# Patient Record
Sex: Female | Born: 1965
Health system: Southern US, Community
[De-identification: ages and names within clinical notes are randomized; demographics above are authoritative.]

## PROBLEM LIST (undated history)

## (undated) DIAGNOSIS — G4730 Sleep apnea, unspecified: Secondary | ICD-10-CM

## (undated) DIAGNOSIS — E119 Type 2 diabetes mellitus without complications: Secondary | ICD-10-CM

## (undated) DIAGNOSIS — M1990 Unspecified osteoarthritis, unspecified site: Secondary | ICD-10-CM

## (undated) DIAGNOSIS — R51 Headache: Secondary | ICD-10-CM

## (undated) DIAGNOSIS — E611 Iron deficiency: Secondary | ICD-10-CM

## (undated) DIAGNOSIS — F32A Depression, unspecified: Secondary | ICD-10-CM

## (undated) DIAGNOSIS — B019 Varicella without complication: Secondary | ICD-10-CM

## (undated) DIAGNOSIS — I10 Essential (primary) hypertension: Secondary | ICD-10-CM

## (undated) DIAGNOSIS — F329 Major depressive disorder, single episode, unspecified: Secondary | ICD-10-CM

## (undated) DIAGNOSIS — E785 Hyperlipidemia, unspecified: Secondary | ICD-10-CM

## (undated) DIAGNOSIS — E0590 Thyrotoxicosis, unspecified without thyrotoxic crisis or storm: Secondary | ICD-10-CM

## (undated) DIAGNOSIS — N201 Calculus of ureter: Secondary | ICD-10-CM

## (undated) DIAGNOSIS — E042 Nontoxic multinodular goiter: Secondary | ICD-10-CM

## (undated) HISTORY — DX: Varicella without complication: B01.9

## (undated) HISTORY — DX: Nontoxic multinodular goiter: E04.2

## (undated) HISTORY — DX: Depression, unspecified: F32.A

## (undated) HISTORY — DX: Major depressive disorder, single episode, unspecified: F32.9

---

## 1997-07-18 ENCOUNTER — Encounter: Admission: RE | Admit: 1997-07-18 | Discharge: 1997-07-18 | Payer: Self-pay | Admitting: Family Medicine

## 1997-08-15 ENCOUNTER — Encounter: Admission: RE | Admit: 1997-08-15 | Discharge: 1997-08-15 | Payer: Self-pay | Admitting: Family Medicine

## 1998-02-15 ENCOUNTER — Encounter: Admission: RE | Admit: 1998-02-15 | Discharge: 1998-02-15 | Payer: Self-pay | Admitting: Family Medicine

## 1998-05-06 ENCOUNTER — Encounter: Admission: RE | Admit: 1998-05-06 | Discharge: 1998-05-06 | Payer: Self-pay | Admitting: Sports Medicine

## 1998-05-07 ENCOUNTER — Encounter: Admission: RE | Admit: 1998-05-07 | Discharge: 1998-05-07 | Payer: Self-pay | Admitting: Sports Medicine

## 1998-05-17 ENCOUNTER — Encounter: Admission: RE | Admit: 1998-05-17 | Discharge: 1998-05-17 | Payer: Self-pay | Admitting: Family Medicine

## 1998-05-27 ENCOUNTER — Encounter: Admission: RE | Admit: 1998-05-27 | Discharge: 1998-05-27 | Payer: Self-pay | Admitting: Family Medicine

## 1998-06-21 ENCOUNTER — Encounter: Admission: RE | Admit: 1998-06-21 | Discharge: 1998-06-21 | Payer: Self-pay | Admitting: Family Medicine

## 1998-08-02 ENCOUNTER — Emergency Department (HOSPITAL_COMMUNITY): Admission: EM | Admit: 1998-08-02 | Discharge: 1998-08-02 | Payer: Self-pay | Admitting: Emergency Medicine

## 1998-08-06 ENCOUNTER — Encounter: Admission: RE | Admit: 1998-08-06 | Discharge: 1998-08-06 | Payer: Self-pay | Admitting: Family Medicine

## 1999-03-11 ENCOUNTER — Emergency Department (HOSPITAL_COMMUNITY): Admission: EM | Admit: 1999-03-11 | Discharge: 1999-03-11 | Payer: Self-pay | Admitting: Emergency Medicine

## 1999-03-11 ENCOUNTER — Encounter: Payer: Self-pay | Admitting: Emergency Medicine

## 1999-04-04 ENCOUNTER — Encounter: Admission: RE | Admit: 1999-04-04 | Discharge: 1999-04-04 | Payer: Self-pay | Admitting: Family Medicine

## 1999-04-25 ENCOUNTER — Encounter: Admission: RE | Admit: 1999-04-25 | Discharge: 1999-04-25 | Payer: Self-pay | Admitting: Family Medicine

## 1999-05-30 ENCOUNTER — Encounter: Admission: RE | Admit: 1999-05-30 | Discharge: 1999-05-30 | Payer: Self-pay | Admitting: Family Medicine

## 1999-06-12 ENCOUNTER — Encounter: Admission: RE | Admit: 1999-06-12 | Discharge: 1999-06-12 | Payer: Self-pay | Admitting: Family Medicine

## 1999-08-08 ENCOUNTER — Encounter: Admission: RE | Admit: 1999-08-08 | Discharge: 1999-08-08 | Payer: Self-pay | Admitting: Family Medicine

## 1999-08-15 ENCOUNTER — Encounter: Admission: RE | Admit: 1999-08-15 | Discharge: 1999-08-15 | Payer: Self-pay | Admitting: Family Medicine

## 1999-09-13 ENCOUNTER — Emergency Department (HOSPITAL_COMMUNITY): Admission: EM | Admit: 1999-09-13 | Discharge: 1999-09-13 | Payer: Self-pay | Admitting: Emergency Medicine

## 1999-11-07 ENCOUNTER — Encounter: Admission: RE | Admit: 1999-11-07 | Discharge: 1999-11-07 | Payer: Self-pay | Admitting: Family Medicine

## 2000-02-13 ENCOUNTER — Encounter: Admission: RE | Admit: 2000-02-13 | Discharge: 2000-02-13 | Payer: Self-pay | Admitting: Family Medicine

## 2000-03-01 ENCOUNTER — Encounter: Admission: RE | Admit: 2000-03-01 | Discharge: 2000-03-01 | Payer: Self-pay | Admitting: Sports Medicine

## 2000-03-09 ENCOUNTER — Encounter: Payer: Self-pay | Admitting: Sports Medicine

## 2000-03-09 ENCOUNTER — Ambulatory Visit (HOSPITAL_COMMUNITY): Admission: RE | Admit: 2000-03-09 | Discharge: 2000-03-09 | Payer: Self-pay | Admitting: Sports Medicine

## 2000-03-16 ENCOUNTER — Encounter: Admission: RE | Admit: 2000-03-16 | Discharge: 2000-03-16 | Payer: Self-pay | Admitting: Family Medicine

## 2000-04-28 ENCOUNTER — Encounter: Admission: RE | Admit: 2000-04-28 | Discharge: 2000-04-28 | Payer: Self-pay | Admitting: Family Medicine

## 2000-05-25 ENCOUNTER — Encounter: Admission: RE | Admit: 2000-05-25 | Discharge: 2000-05-25 | Payer: Self-pay | Admitting: Family Medicine

## 2000-10-03 ENCOUNTER — Inpatient Hospital Stay (HOSPITAL_COMMUNITY): Admission: EM | Admit: 2000-10-03 | Discharge: 2000-10-11 | Payer: Self-pay | Admitting: Emergency Medicine

## 2000-10-03 ENCOUNTER — Encounter (INDEPENDENT_AMBULATORY_CARE_PROVIDER_SITE_OTHER): Payer: Self-pay | Admitting: Specialist

## 2000-10-03 ENCOUNTER — Encounter: Payer: Self-pay | Admitting: Gastroenterology

## 2000-10-06 ENCOUNTER — Encounter: Payer: Self-pay | Admitting: Family Medicine

## 2000-10-25 ENCOUNTER — Encounter: Admission: RE | Admit: 2000-10-25 | Discharge: 2000-10-25 | Payer: Self-pay | Admitting: Family Medicine

## 2001-08-04 ENCOUNTER — Encounter: Admission: RE | Admit: 2001-08-04 | Discharge: 2001-08-04 | Payer: Self-pay | Admitting: Sports Medicine

## 2001-08-16 ENCOUNTER — Encounter: Admission: RE | Admit: 2001-08-16 | Discharge: 2001-08-16 | Payer: Self-pay | Admitting: Family Medicine

## 2001-08-24 ENCOUNTER — Encounter: Admission: RE | Admit: 2001-08-24 | Discharge: 2001-08-24 | Payer: Self-pay | Admitting: Family Medicine

## 2001-10-06 ENCOUNTER — Encounter (INDEPENDENT_AMBULATORY_CARE_PROVIDER_SITE_OTHER): Payer: Self-pay | Admitting: *Deleted

## 2001-10-06 ENCOUNTER — Encounter: Admission: RE | Admit: 2001-10-06 | Discharge: 2001-10-06 | Payer: Self-pay | Admitting: Family Medicine

## 2001-12-05 ENCOUNTER — Encounter: Admission: RE | Admit: 2001-12-05 | Discharge: 2001-12-05 | Payer: Self-pay | Admitting: Family Medicine

## 2002-01-16 ENCOUNTER — Ambulatory Visit (HOSPITAL_BASED_OUTPATIENT_CLINIC_OR_DEPARTMENT_OTHER): Admission: RE | Admit: 2002-01-16 | Discharge: 2002-01-16 | Payer: Self-pay | Admitting: Family Medicine

## 2002-01-25 ENCOUNTER — Encounter: Admission: RE | Admit: 2002-01-25 | Discharge: 2002-01-25 | Payer: Self-pay | Admitting: Family Medicine

## 2002-05-24 ENCOUNTER — Encounter: Admission: RE | Admit: 2002-05-24 | Discharge: 2002-05-24 | Payer: Self-pay | Admitting: Family Medicine

## 2002-06-08 ENCOUNTER — Encounter: Payer: Self-pay | Admitting: Obstetrics & Gynecology

## 2002-06-08 ENCOUNTER — Ambulatory Visit (HOSPITAL_COMMUNITY): Admission: RE | Admit: 2002-06-08 | Discharge: 2002-06-08 | Payer: Self-pay | Admitting: Obstetrics & Gynecology

## 2002-06-22 ENCOUNTER — Encounter: Admission: RE | Admit: 2002-06-22 | Discharge: 2002-06-22 | Payer: Self-pay | Admitting: Family Medicine

## 2002-09-11 ENCOUNTER — Encounter: Admission: RE | Admit: 2002-09-11 | Discharge: 2002-09-11 | Payer: Self-pay | Admitting: Family Medicine

## 2002-09-12 ENCOUNTER — Ambulatory Visit (HOSPITAL_BASED_OUTPATIENT_CLINIC_OR_DEPARTMENT_OTHER): Admission: RE | Admit: 2002-09-12 | Discharge: 2002-09-12 | Payer: Self-pay | Admitting: Internal Medicine

## 2004-03-26 ENCOUNTER — Ambulatory Visit: Payer: Self-pay | Admitting: Sports Medicine

## 2004-03-30 HISTORY — PX: CHOLECYSTECTOMY: SHX55

## 2004-04-18 ENCOUNTER — Ambulatory Visit: Payer: Self-pay | Admitting: Sports Medicine

## 2005-04-07 ENCOUNTER — Ambulatory Visit: Payer: Self-pay | Admitting: Family Medicine

## 2005-04-08 ENCOUNTER — Encounter: Admission: RE | Admit: 2005-04-08 | Discharge: 2005-04-08 | Payer: Self-pay | Admitting: Sports Medicine

## 2005-04-23 ENCOUNTER — Ambulatory Visit: Payer: Self-pay | Admitting: Family Medicine

## 2005-06-03 ENCOUNTER — Ambulatory Visit: Payer: Self-pay | Admitting: Family Medicine

## 2005-06-10 ENCOUNTER — Ambulatory Visit: Payer: Self-pay | Admitting: Family Medicine

## 2005-06-11 ENCOUNTER — Encounter: Admission: RE | Admit: 2005-06-11 | Discharge: 2005-06-11 | Payer: Self-pay | Admitting: Family Medicine

## 2005-06-29 ENCOUNTER — Ambulatory Visit: Payer: Self-pay | Admitting: Family Medicine

## 2005-07-10 ENCOUNTER — Encounter: Admission: RE | Admit: 2005-07-10 | Discharge: 2005-07-10 | Payer: Self-pay | Admitting: Sports Medicine

## 2005-09-15 ENCOUNTER — Encounter (INDEPENDENT_AMBULATORY_CARE_PROVIDER_SITE_OTHER): Payer: Self-pay | Admitting: Specialist

## 2005-09-15 ENCOUNTER — Ambulatory Visit (HOSPITAL_COMMUNITY): Admission: RE | Admit: 2005-09-15 | Discharge: 2005-09-16 | Payer: Self-pay | Admitting: Obstetrics and Gynecology

## 2005-09-15 HISTORY — PX: VAGINAL HYSTERECTOMY: SUR661

## 2005-11-26 ENCOUNTER — Ambulatory Visit (HOSPITAL_COMMUNITY): Admission: RE | Admit: 2005-11-26 | Discharge: 2005-11-26 | Payer: Self-pay | Admitting: Family Medicine

## 2005-11-26 ENCOUNTER — Ambulatory Visit: Payer: Self-pay | Admitting: Family Medicine

## 2006-03-30 ENCOUNTER — Encounter (INDEPENDENT_AMBULATORY_CARE_PROVIDER_SITE_OTHER): Payer: Self-pay | Admitting: *Deleted

## 2006-03-30 ENCOUNTER — Encounter (INDEPENDENT_AMBULATORY_CARE_PROVIDER_SITE_OTHER): Payer: Self-pay | Admitting: Family Medicine

## 2006-03-30 LAB — CONVERTED CEMR LAB: Pap Smear: NORMAL

## 2006-04-02 ENCOUNTER — Encounter (INDEPENDENT_AMBULATORY_CARE_PROVIDER_SITE_OTHER): Payer: Self-pay | Admitting: Family Medicine

## 2006-04-02 ENCOUNTER — Ambulatory Visit: Payer: Self-pay | Admitting: Family Medicine

## 2006-04-02 LAB — CONVERTED CEMR LAB
BUN: 9 mg/dL (ref 6–23)
CO2: 24 meq/L (ref 19–32)
Calcium: 9.6 mg/dL (ref 8.4–10.5)
Chloride: 102 meq/L (ref 96–112)
Creatinine, Ser: 0.83 mg/dL (ref 0.40–1.20)
Direct LDL: 144 mg/dL — ABNORMAL HIGH
Glucose, Bld: 110 mg/dL — ABNORMAL HIGH (ref 70–99)
HCT: 40.2 % (ref 36.0–46.0)
Hemoglobin: 12.6 g/dL (ref 12.0–15.0)
MCHC: 31.3 g/dL (ref 30.0–36.0)
MCV: 72.4 fL — ABNORMAL LOW (ref 78.0–100.0)
Platelets: 444 10*3/uL — ABNORMAL HIGH (ref 150–400)
Potassium: 4.4 meq/L (ref 3.5–5.3)
RBC: 5.55 M/uL — ABNORMAL HIGH (ref 3.87–5.11)
RDW: 14.3 % — ABNORMAL HIGH (ref 11.5–14.0)
Sodium: 138 meq/L (ref 135–145)
TSH: 0.976 microintl units/mL (ref 0.350–5.50)
WBC: 7.8 10*3/uL (ref 4.0–10.5)

## 2006-04-16 ENCOUNTER — Ambulatory Visit: Payer: Self-pay | Admitting: Family Medicine

## 2006-05-04 ENCOUNTER — Ambulatory Visit: Payer: Self-pay | Admitting: Family Medicine

## 2006-05-27 DIAGNOSIS — G43909 Migraine, unspecified, not intractable, without status migrainosus: Secondary | ICD-10-CM | POA: Insufficient documentation

## 2006-05-27 DIAGNOSIS — G4730 Sleep apnea, unspecified: Secondary | ICD-10-CM | POA: Insufficient documentation

## 2006-05-27 DIAGNOSIS — I10 Essential (primary) hypertension: Secondary | ICD-10-CM | POA: Insufficient documentation

## 2006-05-27 DIAGNOSIS — E669 Obesity, unspecified: Secondary | ICD-10-CM | POA: Insufficient documentation

## 2006-05-27 DIAGNOSIS — E7800 Pure hypercholesterolemia, unspecified: Secondary | ICD-10-CM | POA: Insufficient documentation

## 2006-05-28 ENCOUNTER — Encounter (INDEPENDENT_AMBULATORY_CARE_PROVIDER_SITE_OTHER): Payer: Self-pay | Admitting: *Deleted

## 2006-06-14 ENCOUNTER — Ambulatory Visit: Payer: Self-pay | Admitting: Sports Medicine

## 2006-06-14 ENCOUNTER — Telehealth: Payer: Self-pay | Admitting: *Deleted

## 2006-06-14 LAB — CONVERTED CEMR LAB
Bilirubin Urine: NEGATIVE
Glucose, Urine, Semiquant: NEGATIVE
Nitrite: POSITIVE
Protein, U semiquant: 30
Specific Gravity, Urine: 1.02
Urobilinogen, UA: 0.2
Whiff Test: POSITIVE
pH: 6.5

## 2006-06-15 ENCOUNTER — Encounter: Payer: Self-pay | Admitting: *Deleted

## 2006-07-08 ENCOUNTER — Ambulatory Visit: Payer: Self-pay | Admitting: Family Medicine

## 2006-07-09 ENCOUNTER — Telehealth: Payer: Self-pay | Admitting: *Deleted

## 2006-07-23 ENCOUNTER — Ambulatory Visit: Payer: Self-pay | Admitting: Family Medicine

## 2006-07-23 LAB — CONVERTED CEMR LAB
Cholesterol, target level: 200 mg/dL
HDL goal, serum: 40 mg/dL
LDL Goal: 160 mg/dL

## 2006-08-09 ENCOUNTER — Telehealth (INDEPENDENT_AMBULATORY_CARE_PROVIDER_SITE_OTHER): Payer: Self-pay | Admitting: Family Medicine

## 2006-08-30 ENCOUNTER — Telehealth: Payer: Self-pay | Admitting: *Deleted

## 2006-08-31 ENCOUNTER — Ambulatory Visit: Payer: Self-pay | Admitting: Family Medicine

## 2006-08-31 ENCOUNTER — Encounter: Admission: RE | Admit: 2006-08-31 | Discharge: 2006-08-31 | Payer: Self-pay | Admitting: Sports Medicine

## 2006-09-01 ENCOUNTER — Encounter: Admission: RE | Admit: 2006-09-01 | Discharge: 2006-09-01 | Payer: Self-pay | Admitting: Family Medicine

## 2006-09-03 ENCOUNTER — Ambulatory Visit: Payer: Self-pay | Admitting: Sports Medicine

## 2006-09-03 ENCOUNTER — Encounter (INDEPENDENT_AMBULATORY_CARE_PROVIDER_SITE_OTHER): Payer: Self-pay | Admitting: Family Medicine

## 2006-09-03 LAB — CONVERTED CEMR LAB
BUN: 13 mg/dL (ref 6–23)
CO2: 28 meq/L (ref 19–32)
Calcium: 9.3 mg/dL (ref 8.4–10.5)
Chloride: 103 meq/L (ref 96–112)
Cholesterol: 186 mg/dL (ref 0–200)
Creatinine, Ser: 0.86 mg/dL (ref 0.40–1.20)
Glucose, Bld: 123 mg/dL — ABNORMAL HIGH (ref 70–99)
HDL: 56 mg/dL (ref >39)
LDL Cholesterol: 118 mg/dL — ABNORMAL HIGH (ref 0–99)
Potassium: 4.9 meq/L (ref 3.5–5.3)
Sodium: 141 meq/L (ref 135–145)
Total CHOL/HDL Ratio: 3.3
Triglycerides: 60 mg/dL (ref <150)
VLDL: 12 mg/dL (ref 0–40)

## 2006-09-07 ENCOUNTER — Encounter (INDEPENDENT_AMBULATORY_CARE_PROVIDER_SITE_OTHER): Payer: Self-pay | Admitting: Family Medicine

## 2006-09-29 ENCOUNTER — Ambulatory Visit: Payer: Self-pay | Admitting: Sports Medicine

## 2006-10-06 ENCOUNTER — Encounter (INDEPENDENT_AMBULATORY_CARE_PROVIDER_SITE_OTHER): Payer: Self-pay | Admitting: Family Medicine

## 2006-10-13 ENCOUNTER — Ambulatory Visit: Payer: Self-pay | Admitting: Family Medicine

## 2006-11-24 ENCOUNTER — Encounter: Payer: Self-pay | Admitting: *Deleted

## 2006-11-24 ENCOUNTER — Ambulatory Visit: Payer: Self-pay | Admitting: Family Medicine

## 2006-11-24 ENCOUNTER — Encounter (INDEPENDENT_AMBULATORY_CARE_PROVIDER_SITE_OTHER): Payer: Self-pay | Admitting: Family Medicine

## 2006-11-24 LAB — CONVERTED CEMR LAB
Bilirubin Urine: NEGATIVE
Glucose, Urine, Semiquant: NEGATIVE
Ketones, urine, test strip: NEGATIVE
Nitrite: NEGATIVE
Protein, U semiquant: NEGATIVE
Specific Gravity, Urine: 1.025
Urobilinogen, UA: 0.2
pH: 5.5

## 2007-02-10 ENCOUNTER — Encounter (INDEPENDENT_AMBULATORY_CARE_PROVIDER_SITE_OTHER): Payer: Self-pay | Admitting: Family Medicine

## 2007-02-21 ENCOUNTER — Encounter (INDEPENDENT_AMBULATORY_CARE_PROVIDER_SITE_OTHER): Payer: Self-pay | Admitting: Family Medicine

## 2007-02-21 ENCOUNTER — Encounter: Payer: Self-pay | Admitting: *Deleted

## 2007-02-21 ENCOUNTER — Ambulatory Visit: Payer: Self-pay | Admitting: Family Medicine

## 2007-02-21 LAB — CONVERTED CEMR LAB
ALT: 17 units/L (ref 0–35)
AST: 17 units/L (ref 0–37)
Albumin: 4.2 g/dL (ref 3.5–5.2)
Alkaline Phosphatase: 56 units/L (ref 39–117)
BUN: 11 mg/dL (ref 6–23)
Basophils Absolute: 0 10*3/uL (ref 0.0–0.1)
Basophils Relative: 0 % (ref 0–1)
CO2: 25 meq/L (ref 19–32)
Calcium: 10.9 mg/dL — ABNORMAL HIGH (ref 8.4–10.5)
Chloride: 102 meq/L (ref 96–112)
Creatinine, Ser: 0.89 mg/dL (ref 0.40–1.20)
Eosinophils Absolute: 0.1 10*3/uL — ABNORMAL LOW (ref 0.2–0.7)
Eosinophils Relative: 2 % (ref 0–5)
Ferritin: 111 ng/mL (ref 10–291)
Glucose, Bld: 130 mg/dL — ABNORMAL HIGH (ref 70–99)
HCT: 41 % (ref 36.0–46.0)
Hemoglobin: 12.7 g/dL (ref 12.0–15.0)
Iron: 102 ug/dL (ref 42–145)
Lymphocytes Relative: 43 % (ref 12–46)
Lymphs Abs: 2.9 10*3/uL (ref 0.7–4.0)
MCHC: 31 g/dL (ref 30.0–36.0)
MCV: 74.8 fL — ABNORMAL LOW (ref 78.0–100.0)
Monocytes Absolute: 0.6 10*3/uL (ref 0.1–1.0)
Monocytes Relative: 10 % (ref 3–12)
Neutro Abs: 3.1 10*3/uL (ref 1.7–7.7)
Neutrophils Relative %: 46 % (ref 43–77)
Platelets: 343 10*3/uL (ref 150–400)
Potassium: 4.6 meq/L (ref 3.5–5.3)
RBC: 5.48 M/uL — ABNORMAL HIGH (ref 3.87–5.11)
RDW: 14.6 % (ref 11.5–15.5)
Saturation Ratios: 27 % (ref 20–55)
Sodium: 138 meq/L (ref 135–145)
TIBC: 380 ug/dL (ref 250–470)
TSH: 1.073 microintl units/mL (ref 0.350–5.50)
Total Bilirubin: 0.4 mg/dL (ref 0.3–1.2)
Total Protein: 7.9 g/dL (ref 6.0–8.3)
UIBC: 278 ug/dL
WBC: 6.7 10*3/uL (ref 4.0–10.5)
Whiff Test: POSITIVE

## 2007-03-09 ENCOUNTER — Ambulatory Visit: Payer: Self-pay | Admitting: Family Medicine

## 2007-03-09 DIAGNOSIS — F329 Major depressive disorder, single episode, unspecified: Secondary | ICD-10-CM | POA: Insufficient documentation

## 2007-03-29 ENCOUNTER — Encounter: Payer: Self-pay | Admitting: Psychology

## 2007-04-04 ENCOUNTER — Encounter (INDEPENDENT_AMBULATORY_CARE_PROVIDER_SITE_OTHER): Payer: Self-pay | Admitting: Family Medicine

## 2007-07-06 ENCOUNTER — Ambulatory Visit: Payer: Self-pay | Admitting: Family Medicine

## 2007-07-06 ENCOUNTER — Encounter (INDEPENDENT_AMBULATORY_CARE_PROVIDER_SITE_OTHER): Payer: Self-pay | Admitting: Family Medicine

## 2007-07-06 LAB — CONVERTED CEMR LAB
Bilirubin Urine: NEGATIVE
Ferritin: 175 ng/mL (ref 10–291)
Glucose, Urine, Semiquant: NEGATIVE
HCT: 37.9 % (ref 36.0–46.0)
Hemoglobin: 13.1 g/dL (ref 12.0–15.0)
Iron: 93 ug/dL (ref 42–145)
Ketones, urine, test strip: NEGATIVE
MCHC: 34.6 g/dL (ref 30.0–36.0)
MCV: 85.9 fL (ref 78.0–100.0)
Nitrite: NEGATIVE
Platelets: 310 10*3/uL (ref 150–400)
Protein, U semiquant: NEGATIVE
RBC: 4.41 M/uL (ref 3.87–5.11)
RDW: 12.8 % (ref 11.5–15.5)
Saturation Ratios: 26 % (ref 20–55)
Specific Gravity, Urine: 1.015
TIBC: 361 ug/dL (ref 250–470)
UIBC: 268 ug/dL
Urobilinogen, UA: 1
Vitamin B-12: 446 pg/mL (ref 211–911)
WBC: 9.9 10*3/uL (ref 4.0–10.5)
pH: 7

## 2007-07-20 ENCOUNTER — Telehealth (INDEPENDENT_AMBULATORY_CARE_PROVIDER_SITE_OTHER): Payer: Self-pay | Admitting: *Deleted

## 2007-07-29 ENCOUNTER — Ambulatory Visit: Payer: Self-pay | Admitting: Family Medicine

## 2007-09-12 ENCOUNTER — Encounter (INDEPENDENT_AMBULATORY_CARE_PROVIDER_SITE_OTHER): Payer: Self-pay | Admitting: Family Medicine

## 2007-11-14 ENCOUNTER — Encounter (HOSPITAL_COMMUNITY): Admission: RE | Admit: 2007-11-14 | Discharge: 2007-12-22 | Payer: Self-pay | Admitting: General Surgery

## 2008-03-05 ENCOUNTER — Encounter: Admission: RE | Admit: 2008-03-05 | Discharge: 2008-03-05 | Payer: Self-pay | Admitting: Family Medicine

## 2009-05-07 ENCOUNTER — Ambulatory Visit (HOSPITAL_BASED_OUTPATIENT_CLINIC_OR_DEPARTMENT_OTHER): Admission: RE | Admit: 2009-05-07 | Discharge: 2009-05-07 | Payer: Self-pay | Admitting: Orthopedic Surgery

## 2009-10-25 ENCOUNTER — Emergency Department (HOSPITAL_COMMUNITY): Admission: EM | Admit: 2009-10-25 | Discharge: 2009-10-25 | Payer: Self-pay | Admitting: Emergency Medicine

## 2010-03-20 ENCOUNTER — Encounter: Payer: Self-pay | Admitting: Family Medicine

## 2010-04-29 NOTE — Assessment & Plan Note (Signed)
Summary: FU ELEVATED BP PER [facility]   Vital Signs:  Patient Profile:   45 Years Old Female Weight:      176 pounds Pulse rate:   85 / minute BP sitting:   127 / 82  Vitals Entered By: Lillia Pauls CMA (July 23, 2006 2:56 PM)               PCP:  Towana Badger MD  Chief Complaint:  fu elevated bp.  History of Present Illness: FU HTN, HChol.  No complaints with medications.  Pt has not yet been taking Zocor, needs new rx.  Headache HPI:      The patient comes in for chronic management of stable headaches.  Since the last visit, the frequency of headaches have decreased, and the intensity of the headaches have decreased.  The headaches will last anywhere from 30 minutes to several days at a time.  She has approximately 2 headaches per month.  Headaches have been occurring since age 38.        On a scale of 1-10, she describes the headache as a 6.  The location of the headaches are bilateral.  Headache quality is throbbing or pulsating.  The headaches are associated with nausea, photophobia, phonophobia, and osmophobia.        The patient denies first or worst H/A of life, change in frequency from prior H/A's, change in severity from prior H/A's, change in features from prior H/A's, new onset H/A's in middle-age or later, new or progressive H/A lasting days, H/A's with Valsalva (cough/sneeze), mylagia, fever, malaise, weight loss, scalp tenderness, jaw claudication, focal neurologic symptoms, confusion, seizures, and impaired level of consciousness.        Additional history: No recent headaches reported.  Pt doing well for this complaint.    Headache Treatment History:      NSAID's have not been tried.    Hypertension History:      She denies headache, chest pain, palpitations, dyspnea with exertion, orthopnea, PND, peripheral edema, visual symptoms, neurologic problems, syncope, and side effects from treatment.  She notes no problems with any antihypertensive medication side effects.         Positive major cardiovascular risk factors include hyperlipidemia and hypertension.  Negative major cardiovascular risk factors include female age less than 10 years old, no history of diabetes, negative family history for ischemic heart disease, and non-tobacco-user status.        Further assessment for target organ damage reveals no history of ASHD, cardiac end-organ damage (CHF/LVH), stroke/TIA, peripheral vascular disease, renal insufficiency, or hypertensive retinopathy.    Lipid Management History:      Positive NCEP/ATP III risk factors include hypertension.  Negative NCEP/ATP III risk factors include female age less than 31 years old, non-diabetic, no family history for ischemic heart disease, non-tobacco-user status, no ASHD (atherosclerotic heart disease), no prior stroke/TIA, and no peripheral vascular disease.        The patient states that she knows about the "Therapeutic Lifestyle Change" diet.  Adjunctive measures started by the patient include aerobic exercise, fiber, ASA, limit alcohol consumpton, and weight reduction.       Current Allergies: No known allergies   Past Medical History:    Reviewed history from 05/27/2006 and no changes required:       TMNG s/p radioactive I treatment 2001, TSH, free T4, T3  normal  09/2001, TVH w/out BSO 10/2005, uses CPAP for mild OSA  Past Surgical History:    Reviewed history  from 05/27/2006 and no changes required:       BTL 1993 - 08/23/2001, Cholecystectomy - 09/27/2000, sleep study with mild OSA (10 obstructive, 72 hypoapneas) - 02/03/2002   Family History:    Reviewed history from 05/27/2006 and no changes required:       Breast cancer on father`s side, DM, HTN on mothers side  Social History:    Reviewed history from 05/27/2006 and no changes required:       Lives with four children.  Has life partner.  No tobacco, etoh or drug use.  Works for [employer] in [place].    Review of Systems      See HPI     Impression  & Recommendations:  Problem # 1:  HYPERTENSION, BENIGN SYSTEMIC (ICD-401.1) Assessment: Improved Stop Lisinopril. Continue HCTZ and Carvedilol. RTC 3 mos for rpt BMET, BP check.  Her updated medication list for this problem includes:    Zestoretic 10-12.5 Mg Tabs (Lisinopril-hydrochlorothiazide) .Marland Kitchen... Take one daily  Orders: [facility]- Est  Level 4 ([address]109)  Future Orders: Lipid-[facility] ([id number]) ... 10/28/2006 Basic Met-[facility] ([id number]) ... 10/28/2006   Problem # 2:  HYPERCHOLESTEROLEMIA (ICD-272.0) Assessment: Unchanged Continue Zocor. Lifestyle mods discussed. RTC 3 months for rpt Lipid panel. Orders: [facility]- Est  Level 4 (13086)  Future Orders: Lipid-[facility] ([id number]) ... 10/28/2006 Basic Met-[facility] ([id number]) ... 10/28/2006   Problem # 3:  MIGRAINE, UNSPEC., W/O INTRACTABLE MIGRAINE (ICD-346.90) Assessment: Improved Headaches controlled with BP control and BB tx. RTC 3 months or as needed. Orders: [facility]- Est  Level 4 (02725)   Headache Assessment/Plan:    Headache Classification:  Mixed Headache Disorder to include migraine with aura and tension headache  Hypertension Assessment/Plan:      The patient's hypertensive risk group is category B: At least one risk factor (excluding diabetes) with no target organ damage.  Today's blood pressure is 127/82.  Her blood pressure goal is < 140/90.  Lipid Assessment/Plan:      Based on NCEP/ATP III, the patient's risk factor category is "0-1 risk factors".  From this information, the patient's calculated lipid goals are as follows: Total cholesterol goal is 200; LDL cholesterol goal is 160; HDL cholesterol goal is 40; Triglyceride goal is 150.     Patient Instructions: 1)  Please schedule a follow-up appointment in 3 months.  Appended Document: FU ELEVATED BP PER [facility] Recommended 81mg  ASA daily.

## 2010-04-29 NOTE — Progress Notes (Signed)
Summary: wi request  Phone Note Call from Patient Call back at Home Phone [phone]   Reason for Call: Talk to Nurse Summary of Call: pt is requesting wi appt, she sts she has a tingling sensation in her arm, she sts it feels numb Initial call taken by: ERIN LEVAN,  August 30, 2006 11:45 AM  Follow-up for Phone Call        c/o tingling in left arm from ring finger and pinkie to shoulders for the past 2 weeks.  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 appt made with pcp. taking otc with little relief Follow-up by: Golden Circle RN,  August 30, 2006 11:47 AM

## 2010-04-29 NOTE — Assessment & Plan Note (Signed)
Summary: SENSITIVITY CHARGE   

## 2010-04-29 NOTE — Assessment & Plan Note (Signed)
Summary: uti wp   Vital Signs:  Patient Profile:   45 Years Old Female Height:     64 inches Weight:      186.6 pounds BMI:     32.15 Temp:     98.2 degrees F Pulse rate:   60 / minute BP sitting:   111 / 72  (left arm)  Pt. in pain?   no  Vitals Entered By: Towana Badger MD (February 21, 2007 11:42 AM)                  PCP:  Towana Badger MD  Chief Complaint:  vaginal discharge.  History of Present Illness: location: vaginal d/c quality:  fouls smelling, mildly itchy severity: mild duration: 1 week timing: gradual onset context:  none modifying factors or associated conditions:  patient sexually active.  Regular periods.  She notes frequent urination.  location: general quality: fatigue severity:  moderate duration:  months timing:  gradual onset context:  patient started carvedilol for migraine headaches and blood pressure control modifying factors or associated conditions:  none.  patient is sleeping adequately.  She does not presently use her CPAP machine, nor has she in remote history.  She does not know what her settings should be.  She also has iron deficiency anemia.  Past History:  Non-smoker.  History of sleep apnea with previous sleep study and CPAP recommended.  Current Allergies: No known allergies      Review of Systems  General      Complains of fatigue and sleep disorder.      Denies fever, loss of appetite, and malaise.  CV      Denies palpitations.  Resp      Denies cough and shortness of breath.  MS      Denies cramps and muscle weakness.  Derm      Denies rash.  Neuro      Denies visual disturbances and weakness.  Endo      Complains of excessive urination.  Heme      Denies bleeding and enlarge lymph nodes.   Physical Exam  General:     Well-developed,well-nourished,in no acute distress; alert,appropriate and cooperative throughout examination Eyes:     No corneal or conjunctival inflammation noted. EOMI. Perrla.  Funduscopic exam benign, without hemorrhages, exudates or papilledema. Vision grossly normal. Lungs:     Normal respiratory effort, chest expands symmetrically. Lungs are clear to auscultation, no crackles or wheezes. Heart:     Normal rate and regular rhythm. S1 and S2 normal without gallop, murmur, click, rub or other extra sounds. Abdomen:     Bowel sounds positive,abdomen soft and non-tender without masses, organomegaly or hernias noted. Msk:     No deformity or scoliosis noted of thoracic or lumbar spine.   Extremities:     No clubbing, cyanosis, edema, or deformity noted with normal full range of motion of all joints.   Neurologic:     No cranial nerve deficits noted. Station and gait are normal. Plantar reflexes are down-going bilaterally. DTRs are symmetrical throughout. Sensory, motor and coordinative functions appear intact. Psych:     tired-looking.  Oriented X3, normally interactive, good eye contact, not anxious appearing, and withdrawn.      Impression & Recommendations:  Problem # 1:  VAGINAL DISCHARGE (ICD-623.5) Wet prep with clues.  [facility]   Problem # 2:  TIREDNESS (ICD-780.79) Rechecking Hgb, iron studies.  Spot check TSH. I suspect this is secondary to her  BB.  Orders: Comp Met-[facility] [id number]) CBC w/Diff-[facility] [id number]) TSH-[facility] [id number]) Ferritin-[facility] [id number]) Iron -[facility] [id number]) Iron Binding Cap (TIBC)-[facility] ([id number]) Sleep Disorder Referral (Sleep Disorder)   Problem # 3:  DYSURIA (ICD-788.1) She has a UA positive for signs of UTI. Will send culture. Her updated medication list for this problem includes:    Metronidazole 500 Mg Tabs (Metronidazole) ..... One tablet two times a day for bacterial vaginitis, for 7 days    Macrobid 100 Mg Caps (Nitrofurantoin monohyd macro) .Marland Kitchen... Take one (1) by mouth twice a day for 7 days  Orders: Urinalysis-[facility] (00000) Urine Culture-[facility] ([id number])   Complete Medication  List: 1)  Aspirin 81 Mg Chew (Aspirin) .... Take 1 tablet by mouth once a day 2)  Carvedilol 12.5 Mg Tabs (Carvedilol) .... Take 1/2 tablet by mouth twice a day 3)  Hydrochlorothiazide 12.5 Mg Caps (Hydrochlorothiazide) .Marland Kitchen.. 1 capsule by mouth once a day 4)  Niferex-150 150-50-50 Mg Caps (Fe-succ ac-c-thre ac) .... Take 1 capsule by mouth once a day 5)  Zocor 20 Mg Tabs (Simvastatin) .... Take 1 tablet by mouth once a day 6)  Metronidazole 500 Mg Tabs (Metronidazole) .... One tablet two times a day for bacterial vaginitis, for 7 days 7)  Macrobid 100 Mg Caps (Nitrofurantoin monohyd macro) .... Take one (1) by mouth twice a day for 7 days   Patient Instructions: 1)  Please schedule a follow-up appointment in 2 weeks.    Prescriptions: MACROBID 100 MG CAPS (NITROFURANTOIN MONOHYD MACRO) Take one (1) by mouth twice a day for 7 days  #14 x 0   Entered and Authorized by:   Towana Badger MD   Signed by:   Towana Badger MD on 02/21/2007   Method used:   Electronically sent to ...       [id number]       [address]       [place], [place]  [address]       Ph: [phone] or [phone]       Fax: [phone]   RxID:   [id number] ZOCOR 20 MG TABS (SIMVASTATIN) Take 1 tablet by mouth once a day  #34 x 6   Entered and Authorized by:   Towana Badger MD   Signed by:   Towana Badger MD on 02/21/2007   Method used:   Electronically sent to ...       [id number]       [address]       [place], [place]  [address]       Ph: [phone] or [phone]       Fax: [phone]   RxID:   [id number] HYDROCHLOROTHIAZIDE 12.5 MG CAPS (HYDROCHLOROTHIAZIDE) 1 capsule by mouth once a day  #34 x 6   Entered and Authorized by:   Towana Badger MD   Signed by:   Towana Badger MD on 02/21/2007   Method used:   Electronically sent to ...       [id number]       [address]       [place], [place]  [address]       Ph: [phone] or [phone]       Fax: [phone]   RxID:  [id number] ASPIRIN 81 MG CHEW (ASPIRIN) Take 1 tablet by mouth once a day  #90 x 12   Entered and Authorized by:   Towana Badger MD   Signed by:   Towana Badger MD on 02/21/2007   Method used:   Electronically sent to ...       [id number]       [address]       [place], [place]  [address]       Ph: [phone] or [phone]       Fax: [phone]   RxID:   [id number] METRONIDAZOLE 500 MG  TABS (METRONIDAZOLE) one tablet two times a day for bacterial vaginitis, for 7 days  #14 x 0   Entered and Authorized by:   Towana Badger MD   Signed by:   Towana Badger MD on 02/21/2007   Method used:   Electronically sent to ...       [id number]       [address]       [place], [place]  [address]       Ph: [phone] or [phone]       Fax: [phone]   RxID:   [id number]  ] Laboratory Results  Date/Time Received: February 21, 2007 11:58 AM  Date/Time Reported: February 21, 2007 12:05 PM   Wet [place]/KOH Source: Vaginal WBC/hpf: rare Bacteria/hpf: 3+  Cocci Clue cells/hpf: moderate  Positive whiff Yeast/hpf: none Trichomonas/hpf: none Comments: ...................................................................DONNA [facility]  February 21, 2007 12:05 PM     Appended Document: UA RESULTS    Lab Visit   Laboratory Results   Urine Tests  Date/Time Received: February 21, 2007 12:47 PM  Date/Time Reported: February 21, 2007 12:49 PM   Routine Urinalysis   Color: yellow Appearance: Clear Glucose: negative   (Normal Range: Negative) Bilirubin: negative   (Normal Range: Negative) Ketone: negative   (Normal Range: Negative) Spec. Gravity: 1.025   (Normal Range: 1.003-1.035) Blood: trace-lysed   (Normal Range: Negative) pH: 5.5    (Normal Range: 5.0-8.0) Protein: negative   (Normal Range: Negative) Urobilinogen: 0.2   (Normal Range: 0-1) Nitrite: positive   (Normal Range: Negative) Leukocyte Esterace: large   (Normal Range: Negative)  Urine Microscopic WBC/hpf: 20+ RBC/hpf: 1-5 Bacteria: 3+ RODS Epithelial: 1-5    Comments: sent for culture ...................................................................DONNA [facility]  February 21, 2007 12:49 PM     Orders Today:  Appended Document: Orders Update    Clinical Lists Changes  Orders: Added new Test order of [facility]- Est Level  3 (03500) - Signed

## 2010-04-29 NOTE — Assessment & Plan Note (Signed)
Summary: routine check/wp   Vital Signs:  Patient Profile:   45 Years Old Female Height:     64 inches Weight:      185.0 pounds Temp:     98.6 degrees F Pulse rate:   73 / minute BP sitting:   136 / 91  (left arm)  Pt. in pain?   no  Vitals Entered By: Alphia Kava (July 06, 2007 2:41 PM)              Is Patient Diabetic? No     PCP:  Towana Badger MD  Chief Complaint:  f/u and ?uti.  History of Present Illness: HPI/ROS Chronic problem #1:  Depression - Patient DNKA'd Dr. Carola Rhine visit.  She notes persistent mood depression, see HPI. HPI/ROS Chronic problem #2:  Fatigue - Patient notes no improvement in fatigue with reduction of Coreg dose.  The symptoms are neither worsening nor improving.  She notes stable intake of iron supplementation. HPI/ROS Chronic problem #3:  Hypertension - See HPI for routine follow-up. HPI/ROS Chronic problem #4:  Iron deficiency anemia - Unclear as to why she is still anemic, if she is.  Hysterectomy for menorrhagia.  Due for update of CBC and iron studies. HPI/ROS Tobacco Use:  Per USPSTF recommendations, tobacco use is assessed on every visit, regardless of chief complaint.  See Risk Factors and discussion in CPOE where applicable. HPI/ROS Obesity:  Per USPSTF recommendations, obesity is assessed on every visit, regardless of chief complaint.  See BMI noted on VS and CPOE where applicable.  HPI New Complaint (loc, qual, sev, dur, ROS):  Foot dryness and cracking.  Several months of intermittent flaking skin and dryness on both feet.  Skin at heel has cracked and fissured in places.  No trauma.  No erythema or pruritus.  She has been using vaseline topically.  She denies sweating or dampness of feet. Past history/Family History of this problem:  No dermatitis history.  HPI New Complaint (loc, qual, sev, dur, ROS): Dysuria.  Dark, cloudy urine.  No fever, flank pain or vaginitis.  3 days history. Past history/Family History of this problem:  history  intermittent UTI.   Depression History:      The patient presents with symptoms of depression which have been present for greater than two weeks.  She notes that the symptoms started approximately 03/14/2007.  The patient is having a depressed mood most of the day.        The patient denies that she feels like life is not worth living, denies that she wishes that she were dead, and denies that she has thought about ending her life.  Her current symptoms often keep her from doing the things she needs to do.         Depression Treatment History:  Prior Medication Used:   Start Date: Assessment of Effect:   Comments:  Effexor (venlafaxine)     07/06/2007     --       --  Headache HPI:      The patient comes in for chronic management of headaches which have been unstable.  Since the last visit, the frequency of headaches have decreased, but the intensity of the headaches have not changed.  The headaches will last anywhere from 30 minutes to several days at a time.  She has approximately 2 headaches per month.  Headaches have been occurring since age 23.  The patient is left handed.        On  a scale of 1-10, she describes the headache as a 4.  The location of the headaches are bilateral.  Headache quality is throbbing or pulsating.  The headaches are associated with nausea, photophobia, phonophobia, and osmophobia.        The patient denies first or worst H/A of life, change in frequency from prior H/A's, change in severity from prior H/A's, change in features from prior H/A's, mylagia, and fever.         Headache Treatment History:      NSAID's have not been tried.    Hypertension History:      She notes no problems with any antihypertensive medication side effects.        Positive major cardiovascular risk factors include hyperlipidemia and hypertension.  Negative major cardiovascular risk factors include female age less than 29 years old, no history of diabetes, negative family history for  ischemic heart disease, and non-tobacco-user status.        Further assessment for target organ damage reveals no history of ASHD, cardiac end-organ damage (CHF/LVH), stroke/TIA, peripheral vascular disease, renal insufficiency, or hypertensive retinopathy.        Current Allergies: No known allergies       Physical Exam  Awake, alert, no distress.  Pt is responsive and intelligible.  No icterus, jaundice or JVD.  Heart sounds are regular and without murmur, thrill or PMI displacement.  Normal work of breathing with lungs clear to auscultation and percussion.  Abdomen is soft, nontender and nondistended.  Normal bowel sounds.  Extremities are warm and well perfused.  Cranial nerves II-XII intact.  Normal upper extremity strength and sensation to light touch.  Normal gait.  Some tenderness to palpation of posterior neck muscles.  Bilateral dry feet with 1cm round flaking lesions on lateral heel.  No frank heel cracking or fissuring, no evidence of trauma, infection or fat pad ateophy.  Headache Neurologic Exam:    Cranial Nerves Intact:  Yes    Focal Neurologic Deficit:  No    Impression & Recommendations:  Problem # 1:  DEPRESSION, CHRONIC (ICD-311) Assessment: Unchanged Patient willing to try antidepressant.  Her updated medication list for this problem includes:    Venlafaxine Hcl 37.5 Mg Tabs (Venlafaxine hcl) ..... One tablet twice a day for depression and prevention of migraine  Orders: [facility]- Est  Level 4 (99214)   Problem # 2:  MIGRAINE, UNSPEC., W/O INTRACTABLE MIGRAINE (ICD-346.90) Venlaxafine for comorbid depression and migraine prophylaxis.  Her updated medication list for this problem includes:    Aspirin 81 Mg Chew (Aspirin) .Marland Kitchen... Take 1 tablet by mouth once a day    Carvedilol 6.25 Mg Tabs (Carvedilol) ..... One tablet twice a day for blood pressure   Problem # 3:  DERMATOPHYTOSIS OF FOOT (ICD-110.4) Assessment: New KOH pending. Orders: [facility]- Est  Level  4 (99214)   Problem # 4:  ANEMIA, IRON DEFICIENCY (ICD-280.9)  Her updated medication list for this problem includes:    Niferex-150 150-50-50 Mg Caps (Fe-succ ac-c-thre ac) .Marland Kitchen... Take 1 capsule by mouth twice a day  Orders: CBC-[facility] (16109) Iron -[facility] [phone]) Iron Binding Cap (TIBC)-[facility] ([id number]) Ferritin-[facility] [phone]) [facility]- Est  Level 4 (69629)   Problem # 5:  TIREDNESS (ICD-780.79)  Orders: B12-[facility] ([id number]) [facility]- Est  Level 4 (10272)   Problem # 6:  CERVICAL STRAIN, BILATERAL (ICD-847.0) Assessment: New Return in two weeks for reevaluation. No evidence of meningismus or red flags for acute neuropathy.  Her updated medication list for this  problem includes:    Aspirin 81 Mg Chew (Aspirin) .Marland Kitchen... Take 1 tablet by mouth once a day    Flexeril 10 Mg Tab (Cyclobenzaprine hcl) .Marland Kitchen... Take 1 tablet by mouth at bedtime  Orders: [facility]- Est  Level 4 (16109)   Problem # 7:  UTI (ICD-599.0)  Her updated medication list for this problem includes:    Macrobid 100 Mg Caps (Nitrofurantoin monohyd macro) .Marland Kitchen... Take one (1) by mouth twice a day  Orders: [facility]- Est  Level 4 (60454)   Complete Medication List: 1)  Aspirin 81 Mg Chew (Aspirin) .... Take 1 tablet by mouth once a day 2)  Carvedilol 6.25 Mg Tabs (Carvedilol) .... One tablet twice a day for blood pressure 3)  Hydrochlorothiazide 12.5 Mg Caps (Hydrochlorothiazide) .Marland Kitchen.. 1 capsule by mouth once a day 4)  Niferex-150 150-50-50 Mg Caps (Fe-succ ac-c-thre ac) .... Take 1 capsule by mouth twice a day 5)  Zocor 20 Mg Tabs (Simvastatin) .... Take 1 tablet by mouth once a day 6)  Venlafaxine Hcl 37.5 Mg Tabs (Venlafaxine hcl) .... One tablet twice a day for depression and prevention of migraine 7)  Flexeril 10 Mg Tab (Cyclobenzaprine hcl) .... Take 1 tablet by mouth at bedtime 8)  Macrobid 100 Mg Caps (Nitrofurantoin monohyd macro) .... Take one (1) by mouth twice a day  Other Orders: Urinalysis-[facility] (00000) KOH-[facility]  (09811)  Headache Assessment/Plan:    Headache Classification:  Mixed Headache Disorder to include migraine with aura and tension headache  Hypertension Assessment/Plan:      The patient's hypertensive risk group is category B: At least one risk factor (excluding diabetes) with no target organ damage.  Her calculated 10 year risk of coronary heart disease is 3 %.  Today's blood pressure is 136/91.  Her blood pressure goal is < 140/90.   Patient Instructions: 1)  Please schedule a follow-up appointment in 2 weeks.    Prescriptions: FLEXERIL 10 MG TAB (CYCLOBENZAPRINE HCL) Take 1 tablet by mouth at bedtime  #30 x 2   Entered and Authorized by:   Towana Badger MD   Signed by:   Towana Badger MD on 07/06/2007   Method used:   Print then Give to Patient   RxID:   [id number]  ] Laboratory Results   Urine Tests  Date/Time Received: July 06, 2007 2:56 PM  Date/Time Reported: July 06, 2007 3:11 PM   Routine Urinalysis   Color: yellow Appearance: Hazy Glucose: negative   (Normal Range: Negative) Bilirubin: negative   (Normal Range: Negative) Ketone: negative   (Normal Range: Negative) Spec. Gravity: 1.015   (Normal Range: 1.003-1.035) Blood: trace-intact   (Normal Range: Negative) pH: 7.0   (Normal Range: 5.0-8.0) Protein: negative   (Normal Range: Negative) Urobilinogen: 1.0   (Normal Range: 0-1) Nitrite: negative   (Normal Range: Negative) Leukocyte Esterace: moderate   (Normal Range: Negative)  Urine Microscopic WBC/HPF: 10-20 RBC/HPF: occ Bacteria/HPF: 3++ Epithelial/HPF: rare    Comments: ...................................................................[facility]  July 06, 2007 3:11 PM     Appended Document: [facility] report    Lab Visit   Laboratory Results  Date/Time Received: July 06, 2007 3:30 PM  Date/Time Reported: July 06, 2007 5:16 PM   Other Tests  Skin KOH: Negative Comments: ...............test performed by......Marland KitchenBonnie A. [place], [place]  (ASCP)    Orders Today:

## 2010-04-29 NOTE — Assessment & Plan Note (Signed)
Summary: [facility] for initial behavioral medicine    PCP:  Towana Badger MD   History of Present Illness: Will await follow-up from patient.  Current Allergies: No known allergies         Complete Medication List: 1)  Aspirin 81 Mg Chew (Aspirin) .... Take 1 tablet by mouth once a day 2)  Carvedilol 6.25 Mg Tabs (Carvedilol) .... One tablet twice a day for blood pressure 3)  Hydrochlorothiazide 12.5 Mg Caps (Hydrochlorothiazide) .Marland Kitchen.. 1 capsule by mouth once a day 4)  Niferex-150 150-50-50 Mg Caps (Fe-succ ac-c-thre ac) .... Take 1 capsule by mouth twice a day 5)  Zocor 20 Mg Tabs (Simvastatin) .... Take 1 tablet by mouth once a day     ]

## 2010-04-29 NOTE — Assessment & Plan Note (Signed)
Summary: tingling l arm-fingers to shoulder x 2 weeks   Vital Signs:  Patient Profile:   45 Years Old Female Height:     63 inches Weight:      172.0 pounds BMI:     30.58 Temp:     98.8 degrees F Pulse rate:   68 / minute BP sitting:   183 / 107  (left arm)  Pt. in pain?   no  Vitals Entered By: Dedra Skeens CMA, (August 31, 2006 3:45 PM)                PCP:  Towana Badger MD  Chief Complaint:  TINGLING IN LEFT ARM-FINGERS TO SHOULDERS X 2 WEEKS.  History of Present Illness: PT c/o median nerve distribution along right arm.  She denies trauma, but notes shoulder pain and an inability to raise the arm much above 90 degrees.  She notes awaking one morning a few days ago with chest and shoulder pain.  The symptoms are steady.  Headache HPI:      The patient comes in for an acute visit for headaches.  The headaches will last anywhere from 30 minutes to several days at a time.  She has approximately 2 headaches per month.  Headaches have been occurring since age 82.        On a scale of 1-10, she describes the headache as a 6.  The location of the headaches are bilateral.  Headache quality is throbbing or pulsating.  The headaches are associated with nausea, photophobia, phonophobia, and osmophobia.        The patient denies first or worst H/A of life, change in frequency from prior H/A's, change in severity from prior H/A's, change in features from prior H/A's, new onset H/A's in middle-age or later, new or progressive H/A lasting days, H/A's with Valsalva (cough/sneeze), mylagia, fever, malaise, weight loss, scalp tenderness, jaw claudication, focal neurologic symptoms, confusion, seizures, and impaired level of consciousness.         Headache Treatment History:      NSAID's have not been tried.  Effective acute treatment medications she has tried include ASA-plain and ASA-butalbital-caffeine, but acetaminophen-plain, naproxen, and ibuprofen were ineffective.    Hypertension History:     She complains of headache, but denies chest pain, palpitations, dyspnea with exertion, orthopnea, PND, peripheral edema, visual symptoms, neurologic problems, syncope, and side effects from treatment.  Further comments include: Pt ran out of antihypertensive therapies prior to visit - a problem with the pharmacy.        Positive major cardiovascular risk factors include hyperlipidemia and hypertension.  Negative major cardiovascular risk factors include female age less than 55 years old, no history of diabetes, negative family history for ischemic heart disease, and non-tobacco-user status.        Further assessment for target organ damage reveals no history of ASHD, cardiac end-organ damage (CHF/LVH), stroke/TIA, peripheral vascular disease, renal insufficiency, or hypertensive retinopathy.      Current Allergies: No known allergies       Headache Neurologic Exam:    Motor Exam/Strength-Left:       Left Biceps Flexion ( ):  normal       Left Triceps Extension ( ):  normal       Left Hand Grasp ( ):  normal       Left Deltoid ( ):  normal       Left Hip Flexors ( ):  normal  Left Ankle Dorsiflexion (L5,L4):  normal       Left Great Toe Dorsiflexion (L5,L4):  normal       Left Heel Walk (L5,some L4):  normal       Left Single Squat & Rise-Quads (L4):  normal       Left Toe Walk-calf (S1):  normal    Motor Exam/Strength-Right:       Right Biceps Flexion ( ):  normal       Right Triceps Extension ( ):  normal       Right Hand Grasp ( ):  normal       Right Deltoid ( ):  normal       Right Hip Flexors ( ):  normal       Right Ankle Dorsiflexion (L5,L4):  normal       Right Great Toe Dorsiflexion (L5,L4):  normal       Right Heel Walk (L5,some L4):  normal       Right Single Squat & Rise-Quads (L4):  normal       Right Toe Walk-calf (S1):  normal    Sensory Exam/Pinprick-Left:       Left Face:  normal       Left Upper Extremity:  decreased       Left Lower Extremity:   normal       Left Medial Foot (L4):  normal       Left Dorsal Foot (L5):  normal       Left Lateral Foot (S1):  normal    Sensory Exam/Pinprick-Right:       Right Face:  normal       Right Upper Extremity:  normal       Right Lower Extremity:  normal       Right Medial Foot (L4):  normal       Right Dorsal Foot (L5):  normal       Right Lateral Foot (S1):  normal    Sensory-Other:  Pt with slight paresthesia along median nerve distribution on the left.  However, she has rotator cuff TTP and impaired ROM.    Reflexes-Left:       Left Biceps ( ):  normal       Left Triceps ( ):  normal       Left Brachioradialis ( ):  normal       Left Knee Jerk (L4):  normal       Left Ankle Reflex (S1):  normal    Reflexes-Right:       Right Biceps ( ):  normal       Right Triceps ( ):  normal       Right Brachioradialis ( ):  normal       Right Knee Jerk (L4):  normal       Right Ankle Reflex (S1):  normal    Impression & Recommendations:  Problem # 1:  ROTATOR CUFF SYNDROME, RIGHT (ICD-726.10)  Orders: Diagnostic X-Ray/Fluoroscopy (Diagnostic X-Ray/Flu) [facility]- Est  Level 4 (09811)   Problem # 2:  HYPERTENSION, BENIGN SYSTEMIC (ICD-401.1) Refilled medications.   The following medications were removed from the medication list:    Zestoretic 10-12.5 Mg Tabs (Lisinopril-hydrochlorothiazide) .Marland Kitchen... Take one daily  Orders: [facility]- Est  Level 4 (99214)   Problem # 3:  MIGRAINE, UNSPEC., W/O INTRACTABLE MIGRAINE (ICD-346.90) Tylenol #3 for acute pain. Recommend BP control and restarting Coreg.  RTC in three weeks. Orders: [facility]- Est  Level 4 (91478)  Problem # 4:  HYPERCHOLESTEROLEMIA (ICD-272.0) Assessment: Unchanged PT due for her repeat lipid panel this month. Orders: [facility]- Est  Level 4 (16109)   Headache Assessment/Plan:    Headache Classification:  Mixed Headache Disorder to include migraine with aura and tension headache  Hypertension Assessment/Plan:      The patient's  hypertensive risk group is category B: At least one risk factor (excluding diabetes) with no target organ damage.  Today's blood pressure is 183/107.  Her blood pressure goal is < 140/90.   Patient Instructions: 1)  Please return one morning before your next visit to get your labs. 2)  Please schedule a follow-up appointment in 3 weeks. 3)  Check your Blood Pressure regularly. If it is above: you should make an appointment. 4)  Take 650-1000mg  of Tylenol every 4-6 hours as needed for relief of pain or comfort of fever AVOID taking more than 4000mg   in a 24 hour period (can cause liver damage in higher doses).

## 2010-04-29 NOTE — Assessment & Plan Note (Signed)
Summary: issues from previous visit wp  Medications Added GABARONE 100 MG  TABS (GABAPENTIN) 2 tablets twice a day ASPIRIN 81 MG CHEW (ASPIRIN) Take 1 tablet by mouth once a day CARVEDILOL 12.5 MG TABS (CARVEDILOL) Take 1 tablet by mouth twice a day HYDROCHLOROTHIAZIDE 12.5 MG CAPS (HYDROCHLOROTHIAZIDE) 1 capsule by mouth once a day NAPROSYN 500 MG TABS (NAPROXEN) Take 1 tablet by mouth twice a day NIFEREX-150 150-50-50 MG CAPS (FE-SUCC AC-C-THRE AC) Take 1 capsule by mouth once a day ZOCOR 20 MG TABS (SIMVASTATIN) Take 1 tablet by mouth once a day MACROBID 100 MG  CAPS (NITROFURANTOIN MONOHYD MACRO) 1 tablet twice a day for 7 days with food      Allergies Added: NKDA  Vital Signs:  Patient Profile:   45 Years Old Female Height:     64 inches Weight:      183 pounds Pulse rate:   85 / minute BP sitting:   145 / 98  Pt. in pain?   yes  Vitals Entered By: Arlyss Repress CMA, (November 24, 2006 11:37 AM)              Is Patient Diabetic? No   Chief Complaint:  1.)left arm pain ongoing 8/10 and right foot (arch) pain x 1 week 7/10;  2.) vaginal discharge itchy and white x 5 days 3.) increased urination and dysuria x 3 day.  Acute Visit History:      The patient complains of genitourinary symptoms and musculoskeletal symptoms.  The patient notes pain in the left shoulder.  The pain began 2 months ago.  The quality of the pain is described as dull ache.  On a scale of 1-10, the intensity is described as a 4.  There has been no history of trauma.  Comments: Patient previously seen for left shoulder rotator cuff injury with ulnar nerve involvement.  Patient has been taking it easy, on reduced work, using naprosyn, and notes no improvement.        The genitourinary symptoms have been present for 3 days.  She notes a history of dysuria, vaginal discharge, and vaginal odor.  The patient has a prior history of kidney stones.  The patient has no history of pyelonephritis, a renal anomaly, renal  disease, diabetes, ectopic pregnancy, pelvic inflammatory disease, sexually transmitted disease, immunosuppression, orl contraceptive agent use, or, IUD usage.  Antibiotics have not been used within the last 4 weeks.  She has had 3 or more urinary tract infections in the last 12 months.        Current Allergies: No known allergies   Past Medical History:    Reviewed history from 05/27/2006 and no changes required:       TMNG s/p radioactive I treatment 2001, TSH, free T4, T3  normal  09/2001, TVH w/out BSO 10/2005, uses CPAP for mild OSA  Past Surgical History:    Reviewed history from 05/27/2006 and no changes required:       BTL 1993 - 08/23/2001, Cholecystectomy - 09/27/2000, sleep study with mild OSA (10 obstructive, 72 hypoapneas) - 02/03/2002   Family History:    Reviewed history from 05/27/2006 and no changes required:       Breast cancer on father`s side, DM, HTN on mothers side  Social History:    Reviewed history from 05/27/2006 and no changes required:       Lives with four children.  Has life partner.  No tobacco, etoh or drug use.  Works for [employer] in [place].  Review of Systems      See HPI      Impression & Recommendations:  Problem # 1:  DYSURIA (ICD-788.1) UA suggestive of infection, patient symptomatic. Yeast in wet prep.  Orders: Urinalysis-[facility] (00000) Urine Culture-[facility] ([id number]) [facility]- Est  Level 4 (09811)  Her updated medication list for this problem includes:    Macrobid 100 Mg Caps (Nitrofurantoin monohyd macro) .Marland Kitchen... 1 tablet twice a day for 7 days with food   Problem # 2:  ROTATOR CUFF SYNDROME, LEFT (ICD-726.10) Continue naprosyn as previsouly prescribed. Increased gabapentin to 200mg  two times a day rather than 100mg  two times a day.  Orders: Physical Therapy Referral (PT) [facility]- Est  Level 4 (91478)   Problem # 3:  PLANTAR FASCIITIS, RIGHT (ICD-728.71) Reviewed stretches.  Will get handout. Ice feet each pm. Use Dr. Margart Sickles  pads every day.  RTC for some of our pads if these don't work.  Her updated medication list for this problem includes:    Naprosyn 500 Mg Tabs (Naproxen) .Marland Kitchen... Take 1 tablet by mouth twice a day  Orders: [facility]- Est  Level 4 (29562)   Problem # 4:  HYPERTENSION, BENIGN SYSTEMIC (ICD-401.1) Assessment: Deteriorated Discussed decreasing salt intake. Will follow her back in 6 weeks.  Her updated medication list for this problem includes:    Carvedilol 12.5 Mg Tabs (Carvedilol) .Marland Kitchen... Take 1 tablet by mouth twice a day    Hydrochlorothiazide 12.5 Mg Caps (Hydrochlorothiazide) .Marland Kitchen... 1 capsule by mouth once a day  Orders: [facility] Est  Level 4 (13086)   Complete Medication List: 1)  Gabarone 100 Mg Tabs (Gabapentin) .... 2 tablets twice a day 2)  Aspirin 81 Mg Chew (Aspirin) .... Take 1 tablet by mouth once a day 3)  Carvedilol 12.5 Mg Tabs (Carvedilol) .... Take 1 tablet by mouth twice a day 4)  Hydrochlorothiazide 12.5 Mg Caps (Hydrochlorothiazide) .Marland Kitchen.. 1 capsule by mouth once a day 5)  Naprosyn 500 Mg Tabs (Naproxen) .... Take 1 tablet by mouth twice a day 6)  Niferex-150 150-50-50 Mg Caps (Fe-succ ac-c-thre ac) .... Take 1 capsule by mouth once a day 7)  Zocor 20 Mg Tabs (Simvastatin) .... Take 1 tablet by mouth once a day 8)  Macrobid 100 Mg Caps (Nitrofurantoin monohyd macro) .Marland Kitchen.. 1 tablet twice a day for 7 days with food  [id number]   Patient Instructions: 1)  Ice feet 66m each evening. 2)  Stretch for 5 mins in the morning as we discussed. 3)  Use Dr. Margart Sickles inserts all the time. 4)  Lay off the salt. 5)  Please schedule a follow-up appointment in 1 month or so. 6)  Take the antibiotics for the urinary tract infection as prescribed.  Take with food to minimize stomach upset. 7)  It is important that you exercise regularly at least 20 minutes 5 times a week. If you develop chest pain, have severe difficulty breathing, or feel very tired , stop exercising  immediately and seek medical attention. 8)  You need to lose weight. Consider a lower calorie diet and regular exercise.  9)  Take an Aspirin every day.    Prescriptions: GABARONE 100 MG  TABS (GABAPENTIN) 2 tablets twice a day  #136 x 0   Entered and Authorized by:   Towana Badger MD   Signed by:   Towana Badger MD on 11/24/2006   Method used:   Historical   RxID:   [id number] MACROBID 100 MG  CAPS (NITROFURANTOIN  MONOHYD MACRO) 1 tablet twice a day for 7 days with food  #14 x 0   Entered and Authorized by:   Towana Badger MD   Signed by:   Towana Badger MD on 11/24/2006   Method used:   Electronically sent to ...       [facility] [phone]       [address]       [place], [place]  [address]       Ph: [phone] or [phone]       Fax: [phone]   RxID:   [id number]   Laboratory Results   Urine Tests  Date/Time Recieved: November 24, 2006 11:41 AM  Date/Time Reported: November 24, 2006 11:59 AM   Routine Urinalysis   Color: yellow Appearance: Clear Glucose: negative   (Normal Range: Negative) Bilirubin: negative   (Normal Range: Negative) Ketone: negative   (Normal Range: Negative) Spec. Gravity: 1.025   (Normal Range: 1.003-1.035) Blood: small   (Normal Range: Negative) pH: 5.5   (Normal Range: 5.0-8.0) Protein: negative   (Normal Range: Negative) Urobilinogen: 0.2   (Normal Range: 0-1) Nitrite: negative   (Normal Range: Negative) Leukocyte Esterace: small   (Normal Range: Negative)  Urine Microscopic WBC/hpf: 10-20 RBC/hpf: 0-3 Bacteria: 3+ rods Epithelial: 0-3    Comments: ...................................................................[facility]  November 24, 2006 11:59 AM  Date/Time Received: November 24, 2006 11:41 AM   Date/Time Reported: November 24, 2006 12:01 PM   Wet Mount/KOH Source: Vaginal WBC/hpf rare Bacteria/hpf 3+ Clue cells/hpf none Yeast/hpf few Trichomonas/hpf none Comments  ...................................................................[facility]  November 24, 2006 12:01 PM

## 2010-04-29 NOTE — Miscellaneous (Signed)
  Clinical Lists Changes  Observations: Added new observation of OTHER X-RAY: Exam Type: Shoulder Results:  Degenerative changes of acromioclavicular joint.  (09/01/2006 10:12)     X-ray  Procedure date:  09/01/2006  Findings:      Exam Type: Shoulder Results:  Degenerative changes of acromioclavicular joint.

## 2010-04-29 NOTE — Assessment & Plan Note (Signed)
Summary: F/U VISIT [facility]   Vital Signs:  Patient Profile:   45 Years Old Female Weight:      168.5 pounds Pulse rate:   94 / minute BP sitting:   163 / 103  Pt. in pain?   no  Vitals Entered By: Arlyss Repress CMA, (July 08, 2006 3:37 PM)              Is Patient Diabetic? No   PCP:  Towana Badger MD  Chief Complaint:  f/up HTN/ pt does not take BP meds. re-check BP 176/105.  History of Present Illness: Pt seen recently for UTI.  She had run out of her lisonopril/HCTZ, and was found to have a high blood pressure.  Restarted on medicine, and was lightheaded and sick at work soon after.  Is willing to take medications as prescribed, but she is concerned about the side effects and would like to get rid of them if possible.  With pressure flucuations and medication effects, pt c/o headaches.  Headaches are bilateral, frontal, pulsating.  She has a history of headaches, previously controlled with medications that she nor I can remember.  She hasn't taken anything recently.   Acute Visit History: The patient has no history of renal disease or diabetes.         Headache HPI:      The patient comes in for chronic management of headaches which have been unstable.  Since the last visit, the frequency of headaches have increased, and the intensity of the headaches have increased.  The headaches will last anywhere from 30 minutes to several days at a time.  She has approximately 2 headaches per month.  Headaches have been occurring since age 68.        On a scale of 1-10, she describes the headache as a 6.  The location of the headaches are bilateral.  Headache quality is throbbing or pulsating.  The headaches are associated with nausea, photophobia, phonophobia, and osmophobia.        The patient denies first or worst H/A of life, change in frequency from prior H/A's, change in severity from prior H/A's, change in features from prior H/A's, new onset H/A's in middle-age or later, new or  progressive H/A lasting days, H/A's with Valsalva (cough/sneeze), mylagia, fever, malaise, weight loss, scalp tenderness, jaw claudication, focal neurologic symptoms, confusion, seizures, and impaired level of consciousness.         Headache Treatment History:      NSAID's have not been tried.  Effective acute treatment medications she has tried include ASA-plain, but acetaminophen-plain was ineffective.    Hypertension History:      She complains of headache, but denies chest pain, palpitations, dyspnea with exertion, orthopnea, PND, peripheral edema, visual symptoms, neurologic problems, syncope, and side effects from treatment.  She notes the following problems with antihypertensive medication side effects: Lightheadedness with ACE-I/HCTZ.        Positive major cardiovascular risk factors include hyperlipidemia and hypertension.  Negative major cardiovascular risk factors include female age less than 88 years old, no history of diabetes, negative family history for ischemic heart disease, and non-tobacco-user status.        Further assessment for target organ damage reveals no history of ASHD, cardiac end-organ damage (CHF/LVH), stroke/TIA, peripheral vascular disease, renal insufficiency, or hypertensive retinopathy.      Prior Medications: ZESTORETIC 10-12.5 MG TABS (LISINOPRIL-HYDROCHLOROTHIAZIDE) take one daily Current Allergies: No known allergies       Headache Neurologic Exam:  Cranial Nerves Intact:  Yes    Focal Neurologic Deficit:  No    Motor Exam/Strength-Left:       Left Biceps Flexion ( ):  normal       Left Triceps Extension ( ):  normal       Left Hand Grasp ( ):  normal       Left Deltoid ( ):  normal       Left Hip Flexors ( ):  normal       Left Ankle Dorsiflexion (L5,L4):  normal       Left Great Toe Dorsiflexion (L5,L4):  normal       Left Heel Walk (L5,some L4):  normal       Left Single Squat & Rise-Quads (L4):  normal    Sensory Exam/Pinprick-Left:        Left Face:  normal       Left Upper Extremity:  normal       Left Lower Extremity:  normal       Left Medial Foot (L4):  normal       Left Dorsal Foot (L5):  normal       Left Lateral Foot (S1):  normal    Reflexes-Left:       Left Biceps ( ):  normal       Left Triceps ( ):  normal       Left Brachioradialis ( ):  normal       Left Knee Jerk (L4):  normal       Left Ankle Reflex (S1):  normal    Reflexes-Right:       Right Biceps ( ):  normal       Right Triceps ( ):  normal       Right Brachioradialis ( ):  normal       Right Knee Jerk (L4):  normal       Right Ankle Reflex (S1):  normal    Impression & Recommendations:  Problem # 1:  HYPERTENSION, BENIGN SYSTEMIC (ICD-401.1) HCTZ 12.5 alone.  Stop Lisinopril for now. Added Coreg, 6.125mg  two times a day then 12.5mg  two times a day after a week. RTC in one month this this compaint.  Her updated medication list for this problem includes:    Zestoretic 10-12.5 Mg Tabs (Lisinopril-hydrochlorothiazide) .Marland Kitchen... Take one daily  Orders: [facility]- Est  Level 4 (99214)   Problem # 2:  MIGRAINE, UNSPEC., W/O INTRACTABLE MIGRAINE (ICD-346.90) Assessment: Deteriorated Trial of NSAIDS for the next week or so. Added Coreg for BP control. RTC if therapy fails. At that time we'll try a triptan Orders: [facility]- Est  Level 4 (95621)   Headache Assessment/Plan:    Headache Classification:  Mixed Headache Disorder to include migraine with aura and tension headache  Hypertension Assessment/Plan:      The patient's hypertensive risk group is category B: At least one risk factor (excluding diabetes) with no target organ damage.  Today's blood pressure is 163/103.  Her blood pressure goal is < 140/90.    Patient Instructions: 1)  Please schedule a follow-up appointment in 2 weeks.

## 2010-04-29 NOTE — Progress Notes (Signed)
Summary: [place] request  Phone Note Call from Patient Call back at Home Phone [phone]   Reason for Call: Talk to Nurse Summary of Call: pt states she is having headaches again and needs to speak with someone about it Initial call taken by: Haydee Salter,  June 14, 2006 10:39 AM  Follow-up for Phone Call        cloudy, smelly urine and itchy genital area. Alo c/o HA Follow-up by: Golden Circle RN,  June 14, 2006 10:49 AM

## 2010-04-29 NOTE — Progress Notes (Signed)
Summary: 4/8 OV rx's never received  Phone Note Call from Patient Call back at [phone]   Summary of Call: Pt states her pharmacy never received rx's for venlafaxine and rx for UTI for 4/8 OV - Pt uses [facility] on [address]. Initial call taken by: Haydee Salter,  July 20, 2007 10:20 AM  Follow-up for Phone Call        will forward tp Dr Mannie Stabile. Do you want to give an rx for these med's? Follow-up by: Alphia Kava,  July 20, 2007 10:25 AM  Additional Follow-up for Phone Call Additional follow up Details #1::        Rx Called In, Provider Notified Additional Follow-up by: Towana Badger MD,  July 20, 2007 10:31 AM    Additional Follow-up for Phone Call Additional follow up Details #2::    pt notified. Follow-up by: Alphia Kava,  July 20, 2007 10:33 AM  New/Updated Medications: MACROBID 100 MG CAPS (NITROFURANTOIN MONOHYD MACRO) Take one (1) by mouth twice a day   Prescriptions: MACROBID 100 MG CAPS (NITROFURANTOIN MONOHYD MACRO) Take one (1) by mouth twice a day  #14 x 0   Entered and Authorized by:   Towana Badger MD   Signed by:   Towana Badger MD on 07/20/2007   Method used:   Electronically sent to ...       [facility]  [id number]       [address]       [place], [place]  [address]       Ph: [phone] or [phone]       Fax: [phone]   RxID:   [id number] VENLAFAXINE HCL 37.5 MG  TABS (VENLAFAXINE HCL) One tablet twice a day for depression and prevention of migraine  #34 x 3   Entered and Authorized by:   Towana Badger MD   Signed by:   Towana Badger MD on 07/20/2007   Method used:   Electronically sent to ...       [facility]  [id number]       [address]       [place], [place]  [address]       Ph: [phone] or [phone]       Fax: [phone]   RxID:   [id number]

## 2010-04-29 NOTE — Miscellaneous (Signed)
Summary: Patient summary  Clinical Lists Changes  Problems: Removed problem of LESION, ULNAR NERVE (ICD-354.2) - Signed Removed problem of UTI (ICD-599.0) Removed problem of CERVICAL STRAIN, BILATERAL (ICD-847.0) Removed problem of DERMATOPHYTOSIS OF FOOT (ICD-110.4) Removed problem of RASH AND OTHER NONSPECIFIC SKIN ERUPTION (ICD-782.1) Removed problem of URINARY URGENCY (JYN-829.56) Removed problem of TIREDNESS (ICD-780.79) Removed problem of VAGINAL DISCHARGE (ICD-623.5) Assessed DEPRESSION, CHRONIC as comment only - Stable.  Her updated medication list for this problem includes:    Venlafaxine Hcl 37.5 Mg Tabs (Venlafaxine hcl) ..... One tablet twice a day for depression and prevention of migraine  Assessed ANEMIA, IRON DEFICIENCY as comment only - Her last CBC was normal.  We could consider stopping the niferex now.  Her updated medication list for this problem includes:    Niferex-150 150-50-50 Mg Caps (Fe-succ ac-c-thre ac) .Marland Kitchen... Take 1 capsule by mouth twice a day  Assessed MIGRAINE, UNSPEC., W/O INTRACTABLE MIGRAINE as comment only - Coreg has helped this.  She reported no headache problems on last visit.  Her updated medication list for this problem includes:    Aspirin 81 Mg Chew (Aspirin) .Marland Kitchen... Take 1 tablet by mouth once a day    Carvedilol 6.25 Mg Tabs (Carvedilol) ..... One tablet twice a day for blood pressure  Assessed HYPERTENSION, BENIGN SYSTEMIC as comment only - Her BP is not controlled.  We tried Carvedilol at a higher dose for this, and it made her sleepy.  Low-dose lisinopril made her feel sick.  She is currently stable for side effects on the Coreg/HCTZ.  Perhaps another ACE-I at low dose first, then perhaps a CCB or ARB next.  Her updated medication list for this problem includes:    Carvedilol 6.25 Mg Tabs (Carvedilol) ..... One tablet twice a day for blood pressure    Hydrochlorothiazide 12.5 Mg Caps (Hydrochlorothiazide) .Marland Kitchen... 1 capsule by mouth once a  day  Assessed APNEA, SLEEP as comment only - Given Hypertension and obesity, would suggest split study.  Observations: Added new observation of PRIMARY MD: Towana Badger MD (09/12/2007 14:53)          Impression & Recommendations:  Problem # 1:  DEPRESSION, CHRONIC (ICD-311) Assessment: Comment Only Stable.  Her updated medication list for this problem includes:    Venlafaxine Hcl 37.5 Mg Tabs (Venlafaxine hcl) ..... One tablet twice a day for depression and prevention of migraine   Problem # 2:  ANEMIA, IRON DEFICIENCY (ICD-280.9) Assessment: Comment Only Her last CBC was normal.  We could consider stopping the niferex now.  Her updated medication list for this problem includes:    Niferex-150 150-50-50 Mg Caps (Fe-succ ac-c-thre ac) .Marland Kitchen... Take 1 capsule by mouth twice a day   Problem # 3:  MIGRAINE, UNSPEC., W/O INTRACTABLE MIGRAINE (ICD-346.90) Assessment: Comment Only Coreg has helped this.  She reported no headache problems on last visit.  Her updated medication list for this problem includes:    Aspirin 81 Mg Chew (Aspirin) .Marland Kitchen... Take 1 tablet by mouth once a day    Carvedilol 6.25 Mg Tabs (Carvedilol) ..... One tablet twice a day for blood pressure   Problem # 4:  HYPERTENSION, BENIGN SYSTEMIC (ICD-401.1) Assessment: Comment Only Her BP is not controlled.  We tried Carvedilol at a higher dose for this, and it made her sleepy.  Low-dose lisinopril made her feel sick.  She is currently stable for side effects on the Coreg/HCTZ.  Perhaps another ACE-I at low dose first, then perhaps a CCB or ARB next.  Her updated medication list for this problem includes:    Carvedilol 6.25 Mg Tabs (Carvedilol) ..... One tablet twice a day for blood pressure    Hydrochlorothiazide 12.5 Mg Caps (Hydrochlorothiazide) .Marland Kitchen... 1 capsule by mouth once a day   Problem # 5:  APNEA, SLEEP (ICD-780.57) Assessment: Comment Only Given Hypertension and obesity, would suggest split  study.   Complete Medication List: 1)  Aspirin 81 Mg Chew (Aspirin) .... Take 1 tablet by mouth once a day 2)  Carvedilol 6.25 Mg Tabs (Carvedilol) .... One tablet twice a day for blood pressure 3)  Hydrochlorothiazide 12.5 Mg Caps (Hydrochlorothiazide) .Marland Kitchen.. 1 capsule by mouth once a day 4)  Niferex-150 150-50-50 Mg Caps (Fe-succ ac-c-thre ac) .... Take 1 capsule by mouth twice a day 5)  Zocor 20 Mg Tabs (Simvastatin) .... Take 1 tablet by mouth once a day 6)  Venlafaxine Hcl 37.5 Mg Tabs (Venlafaxine hcl) .... One tablet twice a day for depression and prevention of migraine 7)  Flexeril 10 Mg Tab (Cyclobenzaprine hcl) .... Take 1 tablet by mouth at bedtime

## 2010-04-29 NOTE — Assessment & Plan Note (Signed)
Summary: FU WK   Vital Signs:  Patient Profile:   45 Years Old Female Height:     64 inches Weight:      176 pounds Temp:     98.2 degrees F Pulse rate:   61 / minute BP sitting:   123 / 78  (left arm)  Pt. in pain?   no  Vitals Entered By: Jacki Cones RN (September 29, 2006 3:03 PM)                Chief Complaint:  f/u x-ray.  Headache HPI:      The patient comes in for chronic management of headaches which have been unstable.  Since the last visit, the frequency of headaches have not changed, but the intensity of the headaches have decreased.  The headaches will last anywhere from 30 minutes to several days at a time.  She has approximately 2 headaches per month.  Headaches have been occurring since age 41.  The patient is left handed.        On a scale of 1-10, she describes the headache as a 4.  The location of the headaches are bilateral.  Headache quality is throbbing or pulsating.  The headaches are associated with nausea, photophobia, phonophobia, and osmophobia.        The patient denies first or worst H/A of life, change in frequency from prior H/A's, change in severity from prior H/A's, change in features from prior H/A's, new onset H/A's in middle-age or later, new or progressive H/A lasting days, H/A's with Valsalva (cough/sneeze), mylagia, fever, malaise, weight loss, scalp tenderness, jaw claudication, focal neurologic symptoms, confusion, seizures, and impaired level of consciousness.         Headache Treatment History:      NSAID's have not been tried.    Hypertension History:      She denies headache, chest pain, palpitations, dyspnea with exertion, orthopnea, PND, peripheral edema, visual symptoms, neurologic problems, syncope, and side effects from treatment.  Further comments include: Patient has gotten membership to [employer] and is working out regularly.        Positive major cardiovascular risk factors include hyperlipidemia and hypertension.  Negative major  cardiovascular risk factors include female age less than 53 years old, no history of diabetes, negative family history for ischemic heart disease, and non-tobacco-user status.        Further assessment for target organ damage reveals no history of ASHD, cardiac end-organ damage (CHF/LVH), stroke/TIA, peripheral vascular disease, renal insufficiency, or hypertensive retinopathy.      Current Allergies: No known allergies   Past Medical History:    Reviewed history from 05/27/2006 and no changes required:       TMNG s/p radioactive I treatment 2001, TSH, free T4, T3  normal  09/2001, TVH w/out BSO 10/2005, uses CPAP for mild OSA   Family History:    Reviewed history from 05/27/2006 and no changes required:       Breast cancer on father`s side, DM, HTN on mothers side  Social History:    Reviewed history from 05/27/2006 and no changes required:       Lives with four children.  Has life partner.  No tobacco, etoh or drug use.  Works for [employer] in [place].   Risk Factors:  Passive smoke exposure:  no Drug use:  no HIV high-risk behavior:  no Alcohol use:  no Exercise:  yes    Times per week:  5  Type:  [employer].   Review of Systems      See HPI   Physical Exam  General:     Well-developed,well-nourished,in no acute distress; alert,appropriate and cooperative throughout examination Lungs:     Normal respiratory effort, chest expands symmetrically. Lungs are clear to auscultation, no crackles or wheezes. Heart:     Normal rate and regular rhythm. S1 and S2 normal without gallop, murmur, click, rub or other extra sounds.  Headache Neurologic Exam:    Cranial Nerves Intact:  Yes    Focal Neurologic Deficit:  No    Motor Exam/Strength-Left:       Left Biceps Flexion ( ):  normal       Left Triceps Extension ( ):  normal       Left Hand Grasp ( ):  normal       Left Deltoid ( ):  normal       Left Hip Flexors ( ):  normal       Left Ankle Dorsiflexion (L5,L4):   normal       Left Great Toe Dorsiflexion (L5,L4):  normal       Left Heel Walk (L5,some L4):  normal       Left Single Squat & Rise-Quads (L4):  normal       Left Toe Walk-calf (S1):  normal    Motor Exam/Strength-Right:       Right Biceps Flexion ( ):  normal       Right Triceps Extension ( ):  normal       Right Hand Grasp ( ):  normal       Right Deltoid ( ):  normal       Right Hip Flexors ( ):  normal       Right Ankle Dorsiflexion (L5,L4):  normal       Right Great Toe Dorsiflexion (L5,L4):  normal       Right Heel Walk (L5,some L4):  normal       Right Single Squat & Rise-Quads (L4):  normal       Right Toe Walk-calf (S1):  normal    Sensory Exam/Pinprick-Left:       Left Face:  normal       Left Upper Extremity:  normal       Left Lower Extremity:  normal       Left Medial Foot (L4):  normal       Left Dorsal Foot (L5):  normal       Left Lateral Foot (S1):  normal    Sensory Exam/Pinprick-Right:       Right Face:  normal       Right Upper Extremity:  normal       Right Lower Extremity:  normal       Right Medial Foot (L4):  normal       Right Dorsal Foot (L5):  normal       Right Lateral Foot (S1):  normal   Shoulder/Elbow Exam  Sensory:    Gross sensation intact in the upper extremities.    Motor:    Normal strength in the upper extremities.    Reflexes:    Normal reflexes in the lower extremities.    Shoulder Exam:    Right:    Inspection:  Normal    Palpation:  Normal    Stability:  stable    Tenderness:  no    Swelling:  no    Erythema:  no  Range of Motion:       Flexion-Active: 60       Extension-Active: 45       Flexion-Passive: 60       Extension-Passive: 45    Left:    Inspection:  Normal    Palpation:  Normal    Stability:  stable    Tenderness:  right AC joint    Swelling:  no    Erythema:  no    Range of Motion:       Flexion-Active: 60       Extension-Active: 45       Flexion-Passive: 60       Extension-Passive: 45  Forearm  Exam:    Right:    Inspection:  Normal    Palpation:  Normal    Stability:  stable    Tenderness:  no    Swelling:  no    Erythema:  no    Left:    Inspection:  Normal    Palpation:  Normal    Stability:  stable    Tenderness:  no    Swelling:  no    Erythema:  no  Elbow Exam:    Right:    Inspection:  Normal    Palpation:  Normal    Stability:  stable    Tenderness:  no    Swelling:  no    Erythema:  no    Range of Motion:       Flexion-Active: 60       Extension-Active: 45       Flexion-Passive: 60       Extension-Passive: 45    Left:    Inspection:  Normal    Palpation:  Normal    Stability:  stable    Tenderness:  no    Swelling:  no    Erythema:  no    Range of Motion:       Flexion-Active: 60       Extension-Active: 45       Flexion-Passive: 60       Extension-Passive: 45    Impression & Recommendations:  Problem # 1:  ROTATOR CUFF SYNDROME, LEFT (ICD-726.10) Assessment: Unchanged Naprosyn rx'd. Will see in [facility] clinic for [place] eval and further consultation. Orders: [facility]- Est  Level 4 ([id number])   Problem # 2:  HYPERTENSION, BENIGN SYSTEMIC (ICD-401.1) Assessment: Improved Great job!! BP well controlled today.  Continue current regimen. Orders: [facility]- Est  Level 4 ([id number])   Problem # 3:  MIGRAINE, UNSPEC., W/O INTRACTABLE MIGRAINE (ICD-346.90) Assessment: Improved Continue current therapy.  Let's see how the Naprosyn for the rotator cuff injury helps with the HA. Orders: [facility]- Est  Level 4 ([id number])   Headache Assessment/Plan:    Headache Classification:  Mixed Headache Disorder to include migraine with aura and tension headache  Hypertension Assessment/Plan:      The patient's hypertensive risk group is category B: At least one risk factor (excluding diabetes) with no target organ damage.  Her calculated 10 year risk of coronary heart disease is 2 %.  Today's blood pressure is 123/78.  Her blood pressure goal is < 140/90.   Patient  Instructions: 1)  Please schedule a follow-up appointment within 2 weeks for [facility].    [facility] med list reviewed.

## 2010-04-29 NOTE — Progress Notes (Signed)
Summary: medication  Phone Note Call from Patient Call back at Home Phone [phone]   Reason for Call: Talk to Doctor Summary of Call: pt sts she was prescribed medication for high blood pressure & cholesterol but never picked up from pharmacy, would like to know if it can be called in agai? [facility] Initial call taken by: ERIN LEVAN,  Aug 09, 2006 4:33 PM  Follow-up for Phone Call        Phone Call Completed, Rx Called In Follow-up by: Towana Badger MD,  Aug 09, 2006 5:55 PM

## 2010-04-29 NOTE — Letter (Signed)
Summary: [facility]  This pt noshowed their appt on 04/01/07 @ 8:30 pm, this office attempted to contact pt and left a message to reschedule their appt. See MD box for hardcopy

## 2010-04-29 NOTE — Assessment & Plan Note (Signed)
Summary: SHOULDER PAIN [facility]  Medications Added GABARONE 100 MG  TABS (GABAPENTIN) 2 tabs by mouth at bedtime 1 week then increase to 2 tabs bid        Vital Signs:  Patient Profile:   45 Years Old Female Height:     64 inches Weight:      175 pounds Pulse rate:   81 / minute BP sitting:   144 / 95  Vitals Entered By: Lillia Pauls CMA (October 13, 2006 11:10 AM)               PCP:  Towana Badger MD  Chief Complaint:  L SHOULDER PAIN X 3-4 MOS.  History of Present Illness: L shoulder pain which is mild to moderate but assoc w left hand numbness *ulnar side0 and 4th and 5th digits numbness for several months now. No specific injury that she is aware of--does a lot of heavy work incl lifting in her job at [employer]. Also works out fairly regularly w exercise and some weights. Numbness all the time, worse w leaniong on elbow or certain positions. No neck pain.  Current Allergies: No known allergies   Past Medical History:    Reviewed history from 05/27/2006 and no changes required:       TMNG s/p radioactive I treatment 2001, TSH, free T4, T3  normal  09/2001, TVH w/out BSO 10/2005, uses CPAP for mild OSA  Past Surgical History:    Reviewed history from 05/27/2006 and no changes required:       BTL 1993 - 08/23/2001, Cholecystectomy - 09/27/2000, sleep study with mild OSA (10 obstructive, 72 hypoapneas) - 02/03/2002       Shoulder/Elbow Exam  Shoulder Exam:    Right:    Inspection:  Normal    Palpation:  Normal    Stability:  stable    Tenderness:  no    Swelling:  no    Erythema:  no    Range of Motion:       Flexion-Active: 25       Extension-Active: 35    Left:    Inspection:  Normal    Palpation:  Normal    Tenderness:  no    Swelling:  no    Erythema:  no    No impingement signs. Some mild apprehension testing.  Nothing makes tingling worse    Range of Motion:       Flexion-Active: 20       Extension-Active: 40  Elbow Exam:    Left:    Inspection:   Normal       Location:  cubital tunnel worsens tingling    Stability:  stable    Swelling:  no    Erythema:  no    Range of Motion:       Flexion-Active: 25       Extension-Active: 40    Negative tinels over guyon's canal   Detailed Back/Spine Exam  Cervical Exam:  Inspection-deformity:    Normal Palpation-spinal tenderness:  Normal Range of Motion:    Forward Flexion:   25 degrees    Hyperextension:   65 degrees    Right Lat. Flexion:   35 degrees    Left Lat. Flexion:   35 degrees    Right Lat. Rotation:   70 degrees    Left Lat. Rotation:   70 degrees Spurling Maneuver:    negative    Impression & Recommendations:  Problem # 1:  LESION, ULNAR NERVE (ICD-354.2) Assessment:  New suspect stretch injury, possible irritation at cubital tunnel. will start gabapentin 100 mg, 2 by mouth at bedtime for 1 week then 2 by mouth two times a day and rtc 3-4 w. Avoid leaning on elbow, avoid carrying  heavy things w elbow extended. discussed probable long course. No work restrictions per se. Orders: [facility]- Est Level  3 ([id number])   Medications Added to Medication List This Visit: 1)  Gabarone 100 Mg Tabs (Gabapentin) .... 2 tabs by mouth at bedtime 1 week then increase to 2 tabs bid     Prescriptions: GABARONE 100 MG  TABS (GABAPENTIN) 2 tabs by mouth at bedtime 1 week then increase to 2 tabs bid  #120 x 2   Entered and Authorized by:   Denny Levy MD   Signed by:   Denny Levy MD on 10/13/2006   Method used:   Handwritten   RxID:   [id number]

## 2010-04-29 NOTE — Assessment & Plan Note (Signed)
Summary: BV, UTI, HTN   Vital Signs:  Patient Profile:   45 Years Old Female Weight:      173 pounds Temp:     97.5 degrees F Pulse rate:   99 / minute BP sitting:   155 / 92  Pt. in pain?   yes    Location:   head    Intensity:   6  Vitals Entered By: Jone Baseman CMA (June 14, 2006 2:54 PM)                Visit Type:  sick visit  Chief Complaint:  ?yeast infection.  History of Present Illness: Pt thinks she has a yeast infection Has vag d/c x 3 days, white, "clumps", some vag itching urine appears cloudy x 3 days Dysuria, feels deep inside x 3 days No fever or chills No back pain Does not take baths  Post menopausal, hysterectomy 6/07  Pt also c/o HA intermittently Usually unilateral, can last 3-14 days at a time No vision changes, no AF, no weakness, no SOB/CP Had similar sxs in past, found to have elevated BP, was started on medicine Ran out of med >1 month ago   Prior Medications: Current Allergies: No known allergies     Risk Factors:  Tobacco use:  never   Review of Systems      See HPI  General      Denies chills, fatigue, fever, sweats, and weakness.  Eyes      Denies discharge, double vision, halos, and light sensitivity.  ENT      Denies decreased hearing, earache, nasal congestion, ringing in ears, and sore throat.  CV      Denies chest pain or discomfort, difficulty breathing at night, lightheadness, palpitations, and shortness of breath with exertion.  Resp      Denies chest discomfort and cough.  GI      Denies abdominal pain, diarrhea, indigestion, nausea, and vomiting.  GU      Complains of discharge and dysuria.   Physical Exam  General:     Well-developed,well-nourished,in no acute distress; alert,appropriate and cooperative throughout examination Neck:     supple Lungs:     CTA bilaterally Heart:     Normal rate and regular rhythm. S1 and S2 normal without gallop, murmur, click, rub or other extra  sounds. Genitalia:     thick, white vaginal discharge, no odor noted.  Vag mucosa normal, no cervix s/p hysterectomy; no adnexal masses or tenderness noted.vaginal discharge.      Impression & Recommendations:  Problem # 1:  UTI (ICD-599.0) Will treat with cipro x 3 days Send urine for culture increase fluid intake  Orders: Urine Culture-[facility] ([id number]) [facility]- Est Level  3 ([id number])   Problem # 2:  BACTERIAL VAGINITIS (ICD-616.10) Flagyl 500mg  by mouth two times a day x 7 days f/u as needed  Orders: [facility]- Est Level  3 ([id number])   Problem # 3:  HYPERTENSION, BENIGN ESSENTIAL (ICD-401.1) f/u in 2 wks for BP check counselled on diet and exercise decrease salt intake Her updated medication list for this problem includes:    Zestoretic 10-12.5 Mg Tabs (Lisinopril-hydrochlorothiazide) .Marland Kitchen... Take one daily  Orders: [facility]- Est Level  3 ([id number])   Medications Added to Medication List This Visit: 1)  Zestoretic 10-12.5 Mg Tabs (Lisinopril-hydrochlorothiazide) .... Take one daily  Other Orders: [facility] (00000) [phone][facility]   Laboratory Results   Urine Tests  Date/Time Recieved: June 14, 2006 3:04 PM  Date/Time  Reported: June 14, 2006 3:38 PM   Routine Urinalysis   Color: yellow Appearance: Cloudy Glucose: negative   (Normal Range: Negative) Bilirubin: negative   (Normal Range: Negative) Ketone: trace (5)   (Normal Range: Negative) Spec. Gravity: 1.020   (Normal Range: 1.003-1.035) Blood: small   (Normal Range: Negative) pH: 6.5   (Normal Range: 5.0-8.0) Protein: 30   (Normal Range: Negative) Urobilinogen: 0.2   (Normal Range: 0-1) Nitrite: positive   (Normal Range: Negative) Leukocyte Esterace: moderate   (Normal Range: Negative)  Urine Microscopic WBC/hpf: 20+ RBC/hpf: occc Bacteria: 3+ rods Epithelial: 5-8    Date/Time Received: June 14, 2006 3:21 PM  Date/Time Reported: June 14, 2006 3:39 PM   Wet [place]/KOH Source: vaginal WBC/hpf  rare Bacteria/hpf 3+  Cocci Clue cells/hpf many  Positive whiff Yeast/hpf none Trichomonas/hpf none

## 2010-04-29 NOTE — Assessment & Plan Note (Signed)
Summary: FU/KH   Vital Signs:  Patient Profile:   45 Years Old Female Height:     64 inches Weight:      181.38 pounds BMI:     31.25 Temp:     98.3 degrees F Pulse rate:   81 / minute BP sitting:   168 / 92  (right arm)  Pt. in pain?   no  Vitals Entered By: Dennison Nancy RN (Jul 29, 2007 4:12 PM)                  PCP:  Towana Badger MD  Chief Complaint:  Follow up.  History of Present Illness: HPI/ROS Chronic problem #1:  Depression - She is feeling better.  The medication is giving her a profound dry mouth, but she is otherwise ok.  She'd like to keep up with the medication.  Regarding her headaches, she notes significant improvement in frequency and severity.  HPI/ROS Chronic problem #2:  Fatigue - Her energy level has improved somewhat.  This is with some mood elevation as well.  HPI/ROS Chronic problem #3:  Hypertension - See HPI for routine follow-up.  She is hypertensive today.  HPI Follow-up #4: Foot dryness and cracking.  The neutrogena heel therapy lotion isn't helping.  She is asking for additional therapy.  Depression History:      The patient comes in today for her first follow up visit for depression.  She notes that the symptoms started approximately 03/14/2007.  The patient denies a depressed mood most of the day and a diminished interest in her usual daily activities.        The patient denies that she feels like life is not worth living, denies that she wishes that she were dead, and denies that she has thought about ending her life.  Due to her current symptoms, it often takes extra effort to do the things she needs to do.         Depression Treatment History:  Prior Medication Used:   Start Date: Assessment of Effect:   Comments:  Effexor (venlafaxine)     07/06/2007   some improvement     dry mouth  Hypertension History:      She denies headache, peripheral edema, and neurologic problems.  She notes no problems with any antihypertensive medication side  effects.        Positive major cardiovascular risk factors include hyperlipidemia and hypertension.  Negative major cardiovascular risk factors include female age less than 77 years old, no history of diabetes, negative family history for ischemic heart disease, and non-tobacco-user status.        Further assessment for target organ damage reveals no history of ASHD, cardiac end-organ damage (CHF/LVH), stroke/TIA, peripheral vascular disease, renal insufficiency, or hypertensive retinopathy.        Current Allergies: No known allergies       Physical Exam  Awake, alert, no distress.  Pt is responsive and intelligible.  No icterus, jaundice or JVD.  Heart sounds are regular and without murmur, thrill or PMI displacement.  Normal work of breathing with lungs clear to auscultation and percussion.  Abdomen is soft, nontender and nondistended.  Normal bowel sounds.  Extremities are warm and well perfused.  Bilateral dry feet with 1cm round flaking lesions on lateral heel.  No frank heel cracking or fissuring, no evidence of trauma, infection or fat pad ateophy.  This is unchanged from prior exam.    Impression & Recommendations:  Problem #  1:  DEPRESSION, CHRONIC (ICD-311) Assessment: Improved Continue Venlafaxine.  Will follow-up. Her updated medication list for this problem includes:    Venlafaxine Hcl 37.5 Mg Tabs (Venlafaxine hcl) ..... One tablet twice a day for depression and prevention of migraine  Orders: [facility]- Est  Level 4 ([id number])   Problem # 2:  DERMATOPHYTOSIS OF FOOT (ICD-110.4) Assessment: Unchanged Advised return to clinic for descaling and orthotics to reduce pressure points. Orders: [facility]- Est  Level 4 ([id number])   Problem # 3:  HYPERTENSION, BENIGN SYSTEMIC (ICD-401.1) Assessment: Deteriorated Patient will likely need another agent.  Advised return to clinic in two weeks for recheck and titration/addition.  Her updated medication list for this problem includes:     Carvedilol 6.25 Mg Tabs (Carvedilol) ..... One tablet twice a day for blood pressure    Hydrochlorothiazide 12.5 Mg Caps (Hydrochlorothiazide) .Marland Kitchen... 1 capsule by mouth once a day  Orders: [facility]- Est  Level 4 ([id number])   Complete Medication List: 1)  Aspirin 81 Mg Chew (Aspirin) .... Take 1 tablet by mouth once a day 2)  Carvedilol 6.25 Mg Tabs (Carvedilol) .... One tablet twice a day for blood pressure 3)  Hydrochlorothiazide 12.5 Mg Caps (Hydrochlorothiazide) .Marland Kitchen.. 1 capsule by mouth once a day 4)  Niferex-150 150-50-50 Mg Caps (Fe-succ ac-c-thre ac) .... Take 1 capsule by mouth twice a day 5)  Zocor 20 Mg Tabs (Simvastatin) .... Take 1 tablet by mouth once a day 6)  Venlafaxine Hcl 37.5 Mg Tabs (Venlafaxine hcl) .... One tablet twice a day for depression and prevention of migraine 7)  Flexeril 10 Mg Tab (Cyclobenzaprine hcl) .... Take 1 tablet by mouth at bedtime  Hypertension Assessment/Plan:      The patient's hypertensive risk group is category B: At least one risk factor (excluding diabetes) with no target organ damage.  Her calculated 10 year risk of coronary heart disease is 4 %.  Today's blood pressure is 168/92.  Her blood pressure goal is < 140/90.   Patient Instructions: 1)  Please schedule a follow-up appointment in 2 weeks.    ]

## 2010-04-29 NOTE — Miscellaneous (Signed)
Summary: PAP Update  Clinical Lists Changes  Observations: Added new observation of PAP DUE: 03/2009 (02/10/2007 16:45) Added new observation of PAP SMEAR: normal (03/30/2006 16:46)       Preventive Care Screening  Pap Smear:    Date:  03/30/2006    Next Due:  03/2009    Results:  normal

## 2010-04-29 NOTE — Progress Notes (Signed)
Summary: Rx from yesterdays appt  Phone Note Call from Patient Call back at Home Phone [phone]   Summary of Call: pt is checking status of rx from yesterdays appt - states rx is not at pharmacy [facility] [address] Initial call taken by: Haydee Salter,  July 09, 2006 9:22 AM  Follow-up for Phone Call        called pt/lmam rx were refilled on [facility] yesterday by dr[facility]pye Follow-up by: Arlyss Repress CMA,,  July 09, 2006 12:04 PM

## 2010-04-29 NOTE — Miscellaneous (Signed)
Summary: Lipids  Clinical Lists Changes  Observations: Added new observation of LIPDGOALSMET: Yes (02/10/2007 16:01) Added new observation of HX HDL<35: no (02/10/2007 16:01) Added new observation of CHD 8YR RSK: 3 % (02/10/2007 16:01) Added new observation of CHIEF CMPLNT: Lipid Management (02/10/2007 16:01)       Lipid Management History:      Positive NCEP/ATP III risk factors include hypertension.  Negative NCEP/ATP III risk factors include female age less than 54 years old, non-diabetic, no family history for ischemic heart disease, non-tobacco-user status, no ASHD (atherosclerotic heart disease), no prior stroke/TIA, and no peripheral vascular disease.        The patient states that she knows about the "Therapeutic Lifestyle Change" diet.    Lipid Assessment/Plan:      Based on NCEP/ATP III, the patient's risk factor category is "0-1 risk factors".  From this information, the patient's calculated lipid goals are as follows: Total cholesterol goal is 200; LDL cholesterol goal is 160; HDL cholesterol goal is 40; Triglyceride goal is 150.  Her LDL cholesterol goal has been met.

## 2010-04-29 NOTE — Assessment & Plan Note (Signed)
Summary: FU/KH   Vital Signs:  Patient Profile:   45 Years Old Female Height:     64 inches Weight:      183.38 pounds BMI:     31.59 Temp:     98.5 degrees F oral Pulse rate:   79 / minute BP sitting:   133 / 91  (left arm)  Pt. in pain?   no  Vitals Entered By: Garen Grams LPN (March 09, 2007 9:41 AM)              Is Patient Diabetic? No     PCP:  Towana Badger MD  Chief Complaint:  f/u visit and f/u on labs.  History of Present Illness: Chronic Condition Status: #1: HTN - BP is up today.  Pt has not been taking Carvedilol secondary to running out. #2: UTI - Follow-up.  Patient denies dysuria sx.  She completed her regimen. #3:  Rotator cuff syndrome - She's stopped her neurontin.  She is feeling tingling in her fingers again.  She is functional at work.  New - Depression - Patient is tearful over the holidays.  She notes several stressors, financial, family, etc.  She gets intermittently weepy and then is ok.  She can stay at home when it gets bad, she locks herself into the house and cries.  She is not suicidal.  She has no family history of depression and no prior dx personally.  She is medication averse.     Hypertension History:      She denies headache, chest pain, palpitations, dyspnea with exertion, orthopnea, PND, peripheral edema, visual symptoms, neurologic problems, syncope, and side effects from treatment.  She notes no problems with any antihypertensive medication side effects.        Positive major cardiovascular risk factors include hyperlipidemia and hypertension.  Negative major cardiovascular risk factors include female age less than 107 years old, no history of diabetes, negative family history for ischemic heart disease, and non-tobacco-user status.        Further assessment for target organ damage reveals no history of ASHD, cardiac end-organ damage (CHF/LVH), stroke/TIA, peripheral vascular disease, renal insufficiency, or hypertensive retinopathy.       Current Allergies: No known allergies    Social History:    Reviewed history from 05/27/2006 and no changes required:       Lives with four children.  Has life partner.  No tobacco, etoh or drug use.  Works for [employer] in [place].   Risk Factors: Tobacco use:  never Passive smoke exposure:  no Drug use:  no HIV high-risk behavior:  no Alcohol use:  no Exercise:  yes    Times per week:  5    Type:  [employer].  Family History Risk Factors:    Family History of MI in females < 13 years old:  no    Family History of MI in males < 11 years old:  no  Mammogram History:    Date of Last Mammogram:  06/28/2005  PAP Smear History:    Date of Last PAP Smear:  03/30/2006   Review of Systems  General      Complains of fatigue and malaise.  Psych      Complains of depression and easily tearful.   Physical Exam  General:     Well-developed,well-nourished,in no acute distress; alert,appropriate and cooperative throughout examination.  Tearful when discussing depression. Lungs:     Normal respiratory effort, chest expands symmetrically. Lungs are clear to  auscultation, no crackles or wheezes. Heart:     Normal rate and regular rhythm. S1 and S2 normal without gallop, murmur, click, rub or other extra sounds. Extremities:     No clubbing, cyanosis, edema, or deformity noted with normal full range of motion of all joints.   Psych:     Oriented X3, memory intact for recent and remote, dysphoric affect, subdued, poor eye contact, and tearful.      Impression & Recommendations:  Problem # 1:  HYPERTENSION, BENIGN SYSTEMIC (ICD-401.1) Patient has lost her Carvedilol. She has tried lisinopril previously at low dose, but it made her feel sick.  We changed to beta blockers, but the Carvedilol makes her feel tired.  We decreased the dose of carvedilol and things have been better. Her BP is up today. She had headaches, and the carvedilol has helped with them as well.  Her  updated medication list for this problem includes:    Carvedilol 6.25 Mg Tabs (Carvedilol) ..... One tablet twice a day for blood pressure    Hydrochlorothiazide 12.5 Mg Caps (Hydrochlorothiazide) .Marland Kitchen... 1 capsule by mouth once a day  Orders: [facility]- Est  Level 4 (16109)   Problem # 2:  ANEMIA, IRON DEFICIENCY (ICD-280.9) We should talk more about this. She had a hysterectomy for heavy vaginal bleeding in 2006. She does not have a clear reason for low iron. Fecal occult blood and peripheral smear should be obtained on next round of labs.  Her updated medication list for this problem includes:    Niferex-150 150-50-50 Mg Caps (Fe-succ ac-c-thre ac) .Marland Kitchen... Take 1 capsule by mouth twice a day  Orders: [facility]- Est  Level 4 (60454)   Problem # 3:  ? of DEPRESSION, CHRONIC (ICD-311) She gets more tearful in the wintertime.  SAD is a possibility and I think some light therapy might be helpful. Will send to counseling and see if Dr. Pascal Lux agrees. She needs her sleep study.   Orders: [facility]- Est  Level 4 (09811)   Complete Medication List: 1)  Aspirin 81 Mg Chew (Aspirin) .... Take 1 tablet by mouth once a day 2)  Carvedilol 6.25 Mg Tabs (Carvedilol) .... One tablet twice a day for blood pressure 3)  Hydrochlorothiazide 12.5 Mg Caps (Hydrochlorothiazide) .Marland Kitchen.. 1 capsule by mouth once a day 4)  Niferex-150 150-50-50 Mg Caps (Fe-succ ac-c-thre ac) .... Take 1 capsule by mouth twice a day 5)  Zocor 20 Mg Tabs (Simvastatin) .... Take 1 tablet by mouth once a day  Hypertension Assessment/Plan:      The patient's hypertensive risk group is category B: At least one risk factor (excluding diabetes) with no target organ damage.  Her calculated 10 year risk of coronary heart disease is 3 %.  Today's blood pressure is 133/91.  Her blood pressure goal is < 140/90.   Patient Instructions: 1)  Please schedule a follow-up appointment in 1 month.    Prescriptions: NIFEREX-150 150-50-50 MG CAPS (FE-SUCC  AC-C-THRE AC) Take 1 capsule by mouth twice a day  #68 x 6   Entered and Authorized by:   Towana Badger MD   Signed by:   Towana Badger MD on 03/09/2007   Method used:   Electronically sent to ...       [id number]       [address] [facility]  [address]       [place], [place]  [address]  Ph: [phone] or [phone]       Fax: [phone]   RxID:   [id number] ZOCOR 20 MG TABS (SIMVASTATIN) Take 1 tablet by mouth once a day  #34 x 6   Entered and Authorized by:   Towana Badger MD   Signed by:   Towana Badger MD on 03/09/2007   Method used:   Electronically sent to ...       [id number]       [address]       [place], [place]  [address]       Ph: [phone] or [phone]       Fax: [phone]   RxID:   [id number] HYDROCHLOROTHIAZIDE 12.5 MG CAPS (HYDROCHLOROTHIAZIDE) 1 capsule by mouth once a day  #34 x 6   Entered and Authorized by:   Towana Badger MD   Signed by:   Towana Badger MD on 03/09/2007   Method used:   Electronically sent to ...       [id number]       [address]       [place], [place]  [address]       Ph: [phone] or [phone]       Fax: [phone]   RxID:   [id number] CARVEDILOL 6.25 MG  TABS (CARVEDILOL) One tablet twice a day for blood pressure  #68 x 3   Entered and Authorized by:   Towana Badger MD   Signed by:   Towana Badger MD on 03/09/2007   Method used:   Electronically sent to ...       [id number]       [address]       [place], [place]  [address]       Ph: [phone] or [phone]       Fax: [phone]   RxID:   [id number]  ]

## 2010-05-01 NOTE — Miscellaneous (Signed)
  Clinical Lists Changes  Problems: Removed problem of ROTATOR CUFF SYNDROME, LEFT (ICD-726.10)

## 2010-06-20 LAB — BASIC METABOLIC PANEL
BUN: 10 mg/dL (ref 6–23)
CO2: 28 mEq/L (ref 19–32)
Calcium: 9.5 mg/dL (ref 8.4–10.5)
Chloride: 103 mEq/L (ref 96–112)
Creatinine, Ser: 0.79 mg/dL (ref 0.4–1.2)
GFR calc Af Amer: >60 mL/min (ref >60)
GFR calc non Af Amer: >60 mL/min (ref >60)
Glucose, Bld: 181 mg/dL — ABNORMAL HIGH (ref 70–99)
Potassium: 4.2 mEq/L (ref 3.5–5.1)
Sodium: 138 mEq/L (ref 135–145)

## 2010-06-20 LAB — GLUCOSE, CAPILLARY
Glucose-Capillary: 105 mg/dL — ABNORMAL HIGH (ref 70–99)
Glucose-Capillary: 111 mg/dL — ABNORMAL HIGH (ref 70–99)

## 2010-06-20 LAB — POCT HEMOGLOBIN-HEMACUE: Hemoglobin: 10.9 g/dL — ABNORMAL LOW (ref 12.0–15.0)

## 2010-07-21 ENCOUNTER — Other Ambulatory Visit: Payer: Self-pay | Admitting: Endocrinology

## 2010-07-21 DIAGNOSIS — E042 Nontoxic multinodular goiter: Secondary | ICD-10-CM

## 2010-07-25 ENCOUNTER — Ambulatory Visit
Admission: RE | Admit: 2010-07-25 | Discharge: 2010-07-25 | Disposition: A | Discharge status: Home / Self Care | Payer: BC Managed Care – PPO | Source: Ambulatory Visit | Attending: Endocrinology | Admitting: Endocrinology

## 2010-07-25 DIAGNOSIS — E042 Nontoxic multinodular goiter: Secondary | ICD-10-CM

## 2010-08-15 NOTE — Op Note (Signed)
NAMEAMYRA, [name]          ACCOUNT NO.:  [id number]   MEDICAL RECORD NO.:  [id number]          PATIENT TYPE:  AMB   LOCATION:  SDC                           FACILITY:  [facility]   PHYSICIAN:  Janine Limbo, M.D.DATE OF BIRTH:  11-May-1965   DATE OF PROCEDURE:  09/15/2005  DATE OF DISCHARGE:                                 OPERATIVE REPORT   PREOPERATIVE DIAGNOSIS:  1.  Fibroid uterus.  2.  Menorrhagia  3.  Dysmenorrhea.  4.  Anemia (hemoglobin 11.2).   POSTOPERATIVE DIAGNOSIS:  1.  Fibroid uterus.  2.  Menorrhagia  3.  Dysmenorrhea.  4.  Anemia (hemoglobin 11.2).   PROCEDURE:  1.  Vaginal hysterectomy.  2.  Right salpingectomy.  3.  Uterine morcellation.   SURGEON:  Dr. Leonard Schwartz   FIRST ASSISTANT:  Dr. Osborn Coho.   ANESTHETIC:  General.   DISPOSITION:  [name] is a 45 year old female, para 5-0-1-5, who  presents with the above-mentioned diagnosis.  She understands the  indications for her procedure and she accepts the risks of, but not limited  to, anesthetic complications, bleeding, infections, and possible damage to  surrounding organs.   FINDINGS:  The uterus was approximately 14-16 weeks size and the weight was  677 grams.  The fallopian tubes and ovaries appeared normal.   PROCEDURE:  The patient was taken to the operating room where general  anesthetic was given.  The patient's abdomen, perineum, and vagina were  prepped with multiple layers of Betadine.  A Foley catheter was placed in  the bladder.  The patient was then sterilely draped.  Examination under  anesthesia was performed.  The cervix was then injected with 30 mL of a  diluted solution of Pitressin and saline.  A circumferential incision was  made around the cervix and the vaginal mucosa was advanced anteriorly and  posteriorly.  The posterior cul-de-sac and then the anterior cul-de-sac were  sharply entered.  Alternating from right to left the uterosacral  ligaments,  cardinal ligaments, parametrial tissues, and the uterine arteries were  clamped, cut, sutured, and tied securely.  We attempted to invert the uterus  through the posterior colpotomy.  We were unsuccessful.  We then began  uterine morcellation removing multiple fibroids from the posterior surface  of the uterus.  We encountered a 10 cm fibroid on the right fundus of the  uterus.  Once we were able to remove this fibroid.  We were able to then  clamp the upper pedicles and the remainder of the uterus was transected from  the pelvis.  The uterus and multiple fibroids were sent to pathology for  evaluation.  The patient was noted to have bleeding from the right fallopian  tube and therefore the tube was clamped and removed.  We then sutured the  upper pedicles using two suture ligatures.  At this point the patient had  hemostasis and the upper pelvis.  She was noted to have bleeding at the  vaginal cuff and therefore the vaginal cuff was sutured using a running lock  suture.  At this point hemostasis was noted to be adequate.  A McCall  culdoplasty suture was placed in the posterior cul-de-sac incorporating the  uterosacral ligaments bilaterally and the posterior peritoneum.  A final  check was made for hemostasis and hemostasis was noted to be adequate.  The  vaginal cuff was closed using figure-of-eight sutures incorporating the  anterior vaginal mucosa, the anterior peritoneum, posterior peritoneum, and  the posterior vaginal mucosa.  The McCall culdoplasty suture was tied  securely and the apex of the vagina was noted to elevate into the mid  pelvis.  0 Vicryl was the suture material used throughout this procedure.  Sponge, needle, instrument counts were correct on two occasions.  Estimated  blood loss was 350 mL.  The patient tolerated her procedure well.  She was  noted to drain 100 mL of clear yellow urine.  The patient was awakened from  her anesthetic without difficulty and  then taken to the recovery room in  stable condition.      Janine Limbo, M.D.  Electronically Signed     AVS/MEDQ  D:  09/15/2005  T:  09/15/2005  Job:  [id number]   cc:   Towana Badger, M.D.  Fax: [phone]

## 2010-08-15 NOTE — Consult Note (Signed)
[place]. [facility]  Patient:    KAURI, GARSON                          MRN: [id number] Proc. Date: 10/03/00 Attending:  Jimmye Norman, M.D. CC:         Raynelle Jan, M.D.   Consultation Report  Dear Dr. Carolyne Fiscal,  Thank you very much for asking me to see [name], a very pleasant 45 year old female who had otherwise been healthy who comes in with abdominal pain, discomfort, lower abdominal pain, jaundice, and abnormal liver function tests.  Her history is that she has been ill for 24 hours with nausea and vomiting, and she had noticed within the last 24 to 48 hours that she was jaundiced. She had no previous history of liver disease, and no one else in the family has a history of hepatic disease, hepatitis, or other problems.  She came to the emergency room where an ultrasound was done which showed right upper quadrant gallstones and a thickened gallbladder wall.  Ducts were not dilated according to the ultrasound.  However, her liver function tests were markedly abnormal with a total bilirubin of 6.6, an alkaline phosphatase of 197, and SGOT and SGPT which were elevated also.  She has a low grade fever of 99.1.  Her other vital signs were stable.  She was mildly tachycardic to 101 on admission, which has decreased.  She is normocephalic, atraumatic.  She has scleral icterus.  Her neck is supple.  Her chest is clear.  Abdomen is distended slightly with no right upper quadrant tenderness, hypoactive bowel sounds.  She has a mild to moderate lower abdominal bilateral lower pelvic type tenderness, and bilateral CVA tenderness.  Rectal and pelvic exams were deferred.  Laboratory studies demonstrated an elevated white count of 11,200.  Her hemoglobin is 13, with a hematocrit of 40%.  Her total bilirubin, as mentioned previously, was 6.6. Her UA is also positive for an urinary tract infection.  IMPRESSION: 1. Hyperbilirubinemia with abnormal liver function  tests, possibly secondary    to an obstructing common duct stone, possibly secondary to acute hepatitis    of some type.  However, with the stones in the gallbladder, the likelihood    is that this is secondary to an obstructing common duct stone. 2. She has a urinary tract infection, possible pyelonephritis with the CVA    tenderness, and this is being treated appropriately.  PLAN:  For her to have antibiotics for her pyelonephritis and also for her acute cholecystitis, and possible mild cholangitis with the elevated white count and hyperbilirubinemia.  She requires an endoscopic retrograde cholangiopancreatography with possible sphincterotomy and stone retrieval if there is a stone present in the common duct, however, this decision will be made by the gastroenterologist.  I have contacted Dr. Matthias Hughs who would be happy to see the patient to evaluate her for an endoscopic retrograde cholangiopancreatography.  Depending on the timing of the endoscopic retrograde cholangiopancreatography, subsequent laparoscopic cholecystectomy will be done approximately 24 hours later. DD:  10/03/00 TD:  10/03/00 Job: [id number]

## 2010-08-15 NOTE — H&P (Signed)
[place]. [facility]  Patient:    [name], [name]                          MRN: [id number] Adm. Date:  10/03/00 Attending:  Santiago Bumpers. Leveda Anna, M.D. Dictator:   Raynelle Jan CC:         Amparo Bristol, M.D.   History and Physical  DATE OF BIRTH:  11-21-1965  PRIMARY CARE PHYSICIAN:  Amparo Bristol, M.D.  HISTORY OF PRESENT ILLNESS:  [name] is a 45 year old African-American female who presents to [facility] Emergency Department with a five-day history of nausea, vomiting, and decreased p.o. intake. She complains of bilateral lower quadrant abdominal pain, dark urine, and fevers and chills. The vomiting is nonbilious and nonbloody in nature. She has had no bowel movements since Wednesday and noted Wednesday that her stool was somewhat more formed than usual. She describes the pain as dull, crampy in nature with no radiation. No aggravating or alleviating factors. She denies any dysuria or hematuria, but she has noted some tea-colored urine. She denies vaginal discharge and has not had any sort of sexual activity for approximately one year now.  REVIEW OF SYSTEMS:  Her review of systems was otherwise positive for some difficulty sleeping, anxiety, decreased energy, and depressed mood. She complains of diffuse muscle pain and chronic neck, shoulder, low back pain. Otherwise is completely negative with the exception of some bilateral ear pain x several months.  PAST MEDICAL HISTORY:  Significant for hyperthyroidism, glucose intolerance, migraine, seborrheic dermatitis, chronic otitis externa.  CURRENT MEDICATIONS:  None.  ALLERGIES:  No known drug allergies.  SOCIAL HISTORY:  She is single. Denies any tobacco, alcohol, or drug use. She works with the [employer].  FAMILY HISTORY:  Significant for diabetes in her father, sister, and thyroid disease in her father and two sisters.  PHYSICAL EXAMINATION:  VITAL SIGNS:  As  noted in chart.  GENERAL:  This is a well-appearing, African-American female, appearing stated age in no acute distress. She is quite talkative in nature.  HEENT:  Eyes are significant for some scleral icterus. Pupils equal, round, and reactive to light. TMs:  The left TM is significant for some pain with manipulation of the external auditory canal. She has a white, grayish-colored membrane in the external canal the TM is clear. The right ear and TM is normal. Mucous membranes are slightly tacky in nature. The palate is icteric. The teeth are in good repair.  NECK:  Without lymphadenopathy but is significant for a mildly elevated size and increased consistency to the thyroid gland.  CARDIOVASCULAR:  She has a normal S1 and S2. Regular rate and rhythm. No murmurs, rubs, or gallops.  LUNGS:  Clear to auscultation bilaterally.  ABDOMEN:  Soft, mildly tender in the bilateral lower quadrants. No rebound or guarding. Negative Murphys. No hepatosplenomegaly or masses.  EXTREMITIES:  Pulses are 2+ and equal bilaterally. She has no clubbing, cyanosis, or edema.  NEUROLOGICAL:  Cranial nerves are intact. She has no gross neurological deficits. Reflexes are 2+ and equal bilaterally. Strength is 5/5. She has no tremor noted.  BACK:  The patient has some mild left-sided CVA tenderness. No CVA tenderness on the right.  ADMISSION LABORATORY DATA:  Significant for white count of 11.2, H&H of 13 and 39.8, platelets 418. She has a left shift with 87% neutrophils. Her CMET is significant for a glucose of 189, her total protein is  8.1, her albumin is 2.9, amylase 59, lipase 56, AST 157, ALT 191, bilirubin 6.6. UA:  She has a specific gravity of 1.031, 250 glucose, small hemoglobin, positive bilirubin, greater than 80 ketones, 100 of protein, nitrite positive, small leukocyte esterase, many bacteria.  Right upper quadrant ultrasound shows gallstones present with some gallbladder wall thickening  consistent with cholecystitis.  ASSESSMENT AND PLAN:  The patient is a 45 year old female with persistent nausea, vomiting, and dehydration secondary to presumably a combination of both cholecystitis, as well as a pyelonephritis.  1. Cholecystitis. We will hydrate with IV fluids and consult general surgery    for evaluation and possibly a cholecystectomy. We will monitor labs, and we    will also obtain a hepatitis panel to look for other causes of the    elevation of her LFTs. 2. Pyelonephritis. We will send urine for culture and will treat with Cipro    400 mg IV q.12h. 3. Dehydration. We will hydrate with fluids and treat nausea with Phenergan. 4. Questionable diabetes mellitus, history of glucose intolerance. We will    check daily fasting CBGs and a hemoglobin A1C. Certainly, if these are    consistently abnormal, she needs diabetic education and be started on    dietary therapy. 5. Hyperthyroidism. We will check a TSH. 6. Questionable depression. The patient meets many of the criteria for    depression based on screening. We will ultimately leave the decision of    starting an antidepressant to the patients primary. DD:  10/03/00 TD:  10/03/00 Job: [id number]

## 2010-08-15 NOTE — Discharge Summary (Signed)
. [facility]  Patient:    [name], [name]                          MRN: [id number] Adm. Date:  [id number] Disc. Date: [id number] Attending:  Sanjuana Letters Dictator:   Juanell Fairly, M.D. CC:         Amparo Bristol, M.D., [facility]  Dr. Lindie Spruce   Discharge Summary  DATE OF BIRTH:  07-09-1965.  DISCHARGE MEDICATIONS: 1. Vicodin 5/500 one to two p.o. q.4h. p.r.n. pain. 2. Cipro 500 mg b.i.d. x 6 days. 3. Cortisporin Otic four drops in the ear t.i.d.  DISCHARGE INSTRUCTIONS: 1. No heavy lifting for four weeks. 2. Low fat diet. 3. As per surgery, may shower and patient dry.  FOLLOW-UP:  Follow-up appointments were given for Dr. Lindie Spruce for staple removal on Friday, October 15, 2000, at 10:30; and with Amparo Bristol, M.D., on Wednesday, October 13, 2000, at 2 p.m. at [facility].  DISCHARGE DIAGNOSES: 1. Status post open cholecystectomy. 2. Pyelonephritis. 3. Pancreatitis. 4. Hypokalemia.  HISTORY OF PRESENT ILLNESS:  The patient presented to the emergency room on October 03, 2000, with complaints of stomach pain, dark urine, hot and cold spells, nausea and vomiting.  The patient had been unable to keep food down since Wednesday. The pain was diffuse, lower abdominal crampy in nature without radiation, without exacerbating or alleviating factors except for minimal alleviation by Advil.  The patient also had a subjective fever.  REVIEW OF SYSTEMS:  Significant for decreased energy, rocky stool on Wednesday with none since, difficulty sleeping and tea-colored urine.  PREVIOUS MEDICAL HISTORY:  Hyperthyroidism, glucose intolerance, migraines, on special diet, seborrheic dermatitis and chronic otitis externa.  SOCIAL HISTORY:  The patient does not drink, does not smoke and denies illicit drug use.  FAMILY HISTORY:  Positive for diabetes mellitus both paternal and one sister, and thyroid problems both  paternal and two sisters.  PHYSICAL EXAMINATION ON ADMISSION:  HEENT:  Icterus conjunctivae, icteric palate.  NECK:  Thyromegaly.  CVA tenderness on the left side.  ABDOMEN:  No Murphys sign and no organomegaly.  ADMISSION LABORATORIES:  WBC 11.2, hemoglobin 13, hematocrit 39.8, platelets 418. Neutrophils 87%. Sodium 137, potassium 3.8, chloride 97, bicarb 31, BUN 6, creatinine 0.9, glucose 189. Amylase was 59, lipase was 56. AST was 157, ALT 191, bilirubin 6.6. Urinalysis showed orange clear urine with specific gravity of 1.031, pH of 6.0, glucose of 250, small amount of hemoglobin, large urine bili, greater than 80 ketones, 100 protein, urobilinogen of 1.0, nitrite positive, small leukocyte esterase, and many bacteria.  Ultrasound showed gallstones with wall thickening consistent with cholecystitis.  HOSPITAL COURSE:  The patient was admitted for cholecystitis and pyelonephritis and questionable diabetes mellitus. The patient was started on 400 mg of IV q.12 Cipro. Surgery and GI were consulted regarding cholecystitis. On the day of admission, ERCP was done with no apparent complications which revealed no common bile duct stone or obstruction seen. Later that evening, the patient complained of severe right upper quadrant pain, was examined and found to have a nonsurgical abdomen but additional labs were drawn which showed greatly increased amylase and lipase of 533 and 998, respectively. Surgery was therefore deferred until the pancreatic enzymes normalized. On July 10, a laparoscopic cholecystectomy was begun but due to an injury to the common bile duct, the procedure had to become an open cholecystectomy. After a fairly unremarkable postop  course, the drain was removed on the 14th of July and the patient was discharged on the 15th of July with six additional days of p.o. antibiotics to complete a 14-day course for the patients pyelonephritis. In regard to the hypokalemia, the  potassium was replaced until the patient began tolerating oral intake of food and the hypokalemia resolved.  FOLLOWUP:   The only suggestion that I have would be to investigate the patients glucose levels to determine whether she is simply glucose intolerant or whether she actually has diabetes. DD:  10/13/00 TD:  10/14/00 Job: [id number]

## 2010-08-15 NOTE — Discharge Summary (Signed)
[name], [name]          ACCOUNT NO.:  [id number]   MEDICAL RECORD NO.:  [id number]          PATIENT TYPE:  OIB   LOCATION:  9303                          FACILITY:  [facility]   PHYSICIAN:  Janine Limbo, M.D.DATE OF BIRTH:  November 06, 1965   DATE OF ADMISSION:  09/15/2005  DATE OF DISCHARGE:  09/16/2005                                 DISCHARGE SUMMARY   DISCHARGE DIAGNOSES:  1.  Fibroid uterus.  2.  Menorrhagia.  3.  Dysmenorrhea.  4.  Anemia (hemoglobin 9.9).   PROCEDURES:  On the date of admission, the patient underwent a total vaginal  hysterectomy with right salpingectomy and uterine morcellation, tolerating  all procedures well.  The patient was found to have a uterus lesion  approximately 14-16 week size with the weight of 677 grams.  The fallopian  tubes and ovaries appeared normal bilaterally.   HISTORY OF PRESENT ILLNESS:  [name] is a 45 year old female, para 5-  0-1-5, who presents with a vaginal hysterectomy because of uterine fibroids,  menorrhagia and dysmenorrhea.  Please see patient's dictated history and  physical examination for details.   PREOPERATIVE PHYSICAL EXAM:  Weight is 185 pounds, height is 5 feet, 5  inches tall.  General exam is within normal limits.  Pelvic exam, external  genitalia is normal.  Vagina is normal.  Cervix is nontender.  Uterus is 12-  14 week size and a large fibroid is noted on the right fundus of the uterus.  Adnexa without any appreciable masses.   HOSPITAL COURSE:  On the day of admission, the patient underwent  aforementioned procedures, tolerating them all well.  The patient's  postoperative course was marked by hyperglycemia.  The patient's capillary  blood glucose reaching 233.  Otherwise, it was unremarkable with the patient  tolerating a postoperative hemoglobin of 9.9 (preoperative hemoglobin 11.2).  By postoperative day #1, the patient had resumed bowel and bladder function  and was therefore being admitted  for discharge home.   DISCHARGE MEDICATIONS:  1.  Vicodin 1-2 tablets every four hours as needed for severe pain.  2.  Ibuprofen 200 mg, 4 tablets every eight hours as needed for mild to      moderate pain.  3.  Phenergan 25 mg, 1 tablet every six hours for nausea as needed.  4.  Iron 325 mg, 1 tablet twice daily for six weeks.   FOLLOWUP:  The patient is to call [facility] at [phone] to  schedule a six-week postoperative visit with Dr. Stefano Gaul.  The patient was  advised to call her family physician as soon as possible to schedule an  appointment to follow up her hyperglycemia.   DISCHARGE INSTRUCTIONS:  The patient was given a copy of [facility] postoperative instruction sheet.  She was further advised to avoid  driving for two weeks, heavy lifting for four weeks, intercourse for six  weeks.  She may shower and bathe, walk up steps, increase her activities  slowly.  Her diet was without restrictions.   FINAL PATHOLOGY:  Uterus and right fallopian tube:  Uterine cervix was  benign, ectocervical and endocervical  mucosa; uterine corporis with benign  secretory endometrium and multiple leiomyomata.      [name]      Janine Limbo, M.D.  Electronically Signed    EJP/MEDQ  D:  10/11/2005  T:  10/11/2005  Job:  [id number]

## 2010-08-15 NOTE — Procedures (Signed)
. [facility]  Patient:    [name], [name]                          MRN: [id number] Proc. Date: 10/03/00 Adm. Date:  [id number] Attending:  Osvaldo Human CC:         Jimmye Norman, M.D.  Amparo Bristol, M.D., [facility]  [facility]   Procedure Report  PROCEDURE:  Endoscopic retrograde cholangiopancreatography.  INDICATION:  This is a 45 year old female who presented to the emergency room today with a several-day history of diffuse, especially low, abdominal pain, and was found to have a bilirubin of 6.6, a lipase of 59, which is mildly elevated, and transaminases in the 150-200 range.  Her ultrasound showed a large gallstones but a normal-caliber duct.  Rule out common duct stone prior to anticipated laparoscopic cholecystectomy.  FINDINGS:  Normal exam.  No intraductal stones identified.  DESCRIPTION OF PROCEDURE:  The nature, purpose, and risks of the procedure had been very carefully discussed with the patient and also discussed to some degree with her sister over the telephone.  Risks discussed included pancreatitis (approximately 5% chance), rare mortality, as well as bleeding, infection, perforation, or need for emergency surgery.  She provided written consent and was brought to the radiology suite.  She was already on IV ciprofloxacin, and no additional antibiotics were administered.  Sedation prior to and during the course of this procedure totalled fentanyl 100 mcg, Versed 10 mg, and also glucagon 0.5 mg, all intravenously, the latter being given to reduce duodenal contractility.  The Olympus diagnostic duodenoscope with the large channel was passed blindly into the esophagus without difficulty and advanced through the pylorus without any gross gastric abnormalities being identified.  The minor and major papillae were rapidly identified.  Initial attempts at cannulation of the common bile  duct were unsuccessful.  I was using the triple-lumen sphincterotome with a guidewire through it.  We obtained two or three pancreatic ductal injections with just a small amount of contrast, and I took care to avoid over-opacifying the pancreatic duct. Probably one-half of the main pancreatic duct was visualized.  There was no evidence of overfilling of the pancreatic duct.  Drainage time was allowed after these pancreatograms.  With redirecting of the cannula and use of the guidewire, we were then able to advance the guidewire up the common duct and advance the sphincterotome selectively and deeply into the common duct, after which it was opacified completely with contrast.  The common duct was a little bit generous in diameter, probably about 6-7 mm or so, but it was not grossly dilated and it tapered to a nice, smooth tip without any evidence of stricturing.  No intraductal filling defects such as stones were identified.  The cystic duct was patent, and the gallbladder filled well.  The intrahepatic biliary tree filled and appeared grossly normal.  Multiple magnification views and spot films were obtained, but we failed to observe any stones anywhere in the biliary tree outside the gallbladder. There did appear to be gradual drainage of the common duct and after waiting about five minutes, repeat fluoroscopy showed drainage from the intrahepatic portion of the biliary tree.  In view of the absence of any definite stones, I felt it was most prudent to presume that the patient had either passed a stone or that the elevated liver chemistries were some sort of reflection of a "reactive hepatopathy." Accordingly,  I did not perform a sphincterotomy or place a stent.  IMPRESSION: 1. No evidence of common duct stones or strictures to account for the elevated    liver chemistries. 2. Patent cystic duct. 3. Minimal pancreatograms obtained, without gross abnormalities identified. 4.  Normal-appearing major ampulla. 5. Perhaps mild dilatation of the common bile duct.  RECOMMENDATIONS: 1. Follow liver chemistries. 2. Proceed with laparoscopic cholecystectomy whenever Dr. Lindie Spruce feels it is    appropriate. 3. Consider intraoperative cholangiography if the patients liver chemistries    fail to normalize between now and the time of surgery. DD:  10/03/00 TD:  10/04/00 Job: [id number]

## 2010-08-15 NOTE — H&P (Signed)
NAME:  [name], [name]          ACCOUNT NO.:  [id number]   MEDICAL RECORD NO.:  [id number]          PATIENT TYPE:   LOCATION:                                 FACILITY:   PHYSICIAN:  Janine Limbo, M.D.    DATE OF BIRTH:   DATE OF ADMISSION:  09/15/2005  DATE OF DISCHARGE:                                HISTORY & PHYSICAL   HISTORY OF PRESENT ILLNESS:  [name] is a 45 year old female, para 5-  0-1-5, who presents for vaginal hysterectomy because of fibroids,  menorrhagia, and dysmenorrhea.  An ultrasound was performed that showed a  9.7 cm fibroid.  No other pathology was noted.  The patient has been treated  in the past with nonsteroidal anti-inflammatory agents and with hormonal  therapy.  These did not relieve her discomfort.  An endometrial biopsy was  obtained that showed benign endometrial elements.  The patient's most recent  Pap smear was within normal limits.  Gonorrhea and chlamydia cultures are  negative.   PAST GYNECOLOGIC HISTORY:  Significant for a tubal ligation and 1993.   OBSTETRICAL HISTORY:  The patient has had 5 full-term vaginal deliveries.  She had one elective pregnancy termination.  Her pregnancies were  complicated by anemia, gestational diabetes, and gestational hypertension.   DRUG ALLERGIES:  No known drug allergies.   PAST MEDICAL HISTORY:  1.  Hypertension.  2.  Migraine headaches.  3.  Bladder infections.  4.  Thyroid disease/  5.  Urinary incontinence.   PAST SURGICAL HISTORY:  1.  She had her wisdom teeth removed many years ago.  2.  She had a cholecystectomy performed at [facility] in      2004.   CURRENT MEDICATIONS:  1.  Ibuprofen 100 mg q.8 h p.r.n. pain.  2.  Hydrochlorothiazide 12.5 mg each day.   SOCIAL HISTORY:  The patient is a Scientist, product/process development.  She had denies  cigarette use, alcohol use, and recreational drug use.   REVIEW OF SYSTEMS:  Please see history of present illness.   FAMILY  HISTORY:  The patient's father has thyroid disease, hypertension, and  diabetes.   PHYSICAL EXAMINATION:  VITAL SIGNS:  Weight is 185 pounds.  Height is 5 feet  5 inches.  HEENT:  Within normal limits.  CHEST:  Clear.  HEART:  Regular rate and rhythm.  BREASTS:  Without masses.  ABDOMEN:  Nontender.  EXTREMITIES:  Grossly normal.  NEUROLOGIC:  Grossly normal.  PELVIC:  External genitalia is normal.  Vagina is normal.  Cervix is  nontender.  Uterus is 12-14 weeks size, and a large fibroid is noted on the  right fundus of the uterus.  Adnexa - no masses are appreciated.   LABORATORY DATA:  Hemoglobin was noted to be 11.5.   ASSESSMENT:  1.  Fibroid uterus.  2.  Menorrhagia.  3.  Dysmenorrhea.  4.  Hypertension.  5.  Anemia.   PLAN:  The patient will undergo a vaginal hysterectomy.  She understands the  indications for her surgical procedure and she accepts the risks of, but not  limited to, anesthetic complications, bleeding, infection,  and possible  damage to surrounding organs.      Janine Limbo, M.D.  Electronically Signed     AVS/MEDQ  D:  09/10/2005  T:  09/10/2005  Job:  [id number]   cc:   Towana Badger, M.D.  Fax: [phone]

## 2010-08-15 NOTE — Op Note (Signed)
[facility]  Patient:    [name], [name]                          MRN: [id number] Proc. Date: 10/06/00 Adm. Date:  [id number] Attending:  Sanjuana Letters                           Operative Report  PREOPERATIVE DIAGNOSIS:  Hyperbilirubinemia with presumptive acute cholecystitis and cholelithiasis.  POSTOPERATIVE DIAGNOSIS:  Hyperbilirubinemia with presumptive acute cholecystitis and cholelithiasis.  PROCEDURES: 1. Failed laparoscopic cholecystectomy. 2. Open cholecystectomy with intraoperative cholangiogram. 3. Repair of common bile duct.  SURGEON:  Jimmye Norman, M.D.  ASSISTANT:  Donnie Coffin. Samuella Cota, M.D.  ANESTHESIA:  General endotracheal.  ESTIMATED BLOOD LOSS:  Less than 100 cc.  COMPLICATIONS:  Injury to common bile duct at the junction of the cystic duct and the common bile duct during laparoscopic cholangiography.  CONDITION:  Stable.  She is to go to PACU and then to 5700 when stable.  DRAINS:  She has a 19 mm Blake drain in [employer].  FINDINGS:  The common bile duct and intrahepatic common ducts were normal after cholangiogram done after repair of the common duct injury.  There was no leakage, and there was normal flow into the duodenum with no evidence of obstruction but a large common duct.  INDICATION FOR PROCEDURE:  The patient is a 45 year old woman admitted with hyperbilirubinemia and gallstones, who was thought to have an obstructed common duct, but her preoperative ERCP was normal.  She is now coming for a laparoscopic cholecystectomy.  DESCRIPTION OF PROCEDURE:  The patient was taken to the operating room and placed on the table in supine position.  After an adequate general anesthetic was administered, she was prepped and draped in the usual sterile manner, exposing the midline and the right upper quadrant of the abdomen.  The patient was placed in the reverse Trendelenburg position.  A supraumbilical  curvilinear incision was made using a #11 blade down to the midline fascia.  It was through this midline fascia that a Veress needle was passed into the peritoneal cavity.  It was confirmed to be in position using the saline test.  Once this was done, carbon dioxide insufflation was instilled into the peritoneal cavity up to a maximum intra-abdominal pressure of 15 mmHg.  Once this was done, and 11/12 mm cannula was passed into the peritoneal cavity and confirmed to be in position with the laparoscope and attached camera and light source.  Two right costal margin 5 mm cannulas and a subxiphoid 10/11 mm cannula were passed into the peritoneal cavity under direct vision.  The patient was placed in steeper reverse Trendelenburg position, her left side was tilted down, and the dissection begun.  The dome of the gallbladder was grasped using a ratcheted grasper through the lateralmost 5 mm cannula, was retracted toward the anterior abdominal wall and the right upper quadrant.  We were able to expose the infundibulum somewhat; however, because of redundant left edge of the liver, the infundibulum was somewhat obscured.  We scored the peritoneum overlying the triangle of Calot and hepatoduodenal triangle and then dissected out the cystic duct and the cystic artery.  The cystic artery was ligated proximally and distally.  This allowed [place] to isolate the cystic duct adequately for a cholangiogram.  Once we had proximal control of the cystic duct, a cholecystodochotomy was made  using the Endoshear scissors and subsequently a Reddick catheter, which had been passed through the anterior abdominal wall, was passed into the cholecystodochotomy.  It went in easily; however, when we went to inflate the balloon, it tore the common duct at the junction of the cystic duct and the common duct origin.  Attempts to demonstrate this demonstrated that the tear was right at that junction; therefore, the decision  was made to change to an open procedure.  The gallbladder had not been removed at this point.  The patient was taken out of reverse Trendelenburg position, and she was flattened out.  Once the instruments were prepared on the counter, then we made an incision from the subxiphoid incision to the medialmost right upper quadrant 5 mm cannula site.  We took it down to the anterior rectus sheath and through the rectus muscles and the posterior rectus sheath using electrocautery.  We extended the incision somewhat laterally in order to get adequate exposure.  A Thompson bar retractor was used to retract into the right upper quadrant. The cholangiogram was still in place as we opened; however, as we entered the peritoneal cavity, we cut it off in order not to cause any further damage to the duct from tethering or pulling during the initial entrance into the abdomen.  We were able to get the [facility] bar in place and subsequently retract inferiorly along the transverse colon and the duodenum using a standard abdominal wall retractor.  We cauterized the peritoneum overlying the gallbladder from the dome down toward the cystic duct, and we were eventually able to isolate out the cystic duct.  Near the infundibulum there was some tearing of the liver bed with some bleeding; however, this was controlled with pressure.  We were able to isolate the cystic duct attached to the gallbladder and subsequently plant a Taut cholangiocatheter through the cholecystodochotomy which had been previously used and demonstrate the tear in the common duct distal to the entrance of the cystic duct.  We repaired this with three interrupted 4-0 PDS sutures and once this was done, there was no further leakage.  We did a cholangiogram, which demonstrated this and also flow into the duodenum without obstruction.  The Taut catheter was removed, which had been secured in place with an endoclip.  We subsequently endoclipped  the cystic duct doubly with endoclips and then subsequently placed a 19 mm Blake drain through the lateralmost 5 mm cannula site into Morisons pouch and secured it into place with a 3-0 nylon.  We irrigated with saline and placed a piece of Surgicel in the torn portion of the liver near the infundibulum.  There was no spillage of bile.  Once this was completed, we reapproximated the posterior rectus sheath using a running 0 PDS suture.  The anterior sheath was reapproximated using figure-of-eight interrupted stitches of #1 Vicryl, and then the skin was closed using stainless steel staples.  The supraumbilical fascial site was reapproximated using an 0 Vicryl pass on a UR6 needle in a figure-of-eight manner.  Staples were used to reapproximate the skin, and a sterile dressing was applied. DD:  10/06/00 TD:  10/07/00 Job: [id number]

## 2010-08-15 NOTE — Consult Note (Signed)
[place]. [facility]  Patient:    [name], [name]                          MRN: [id number] Proc. Date: 10/03/00 Adm. Date:  [id number] Attending:  Sanjuana Letters CC:         William A. Leveda Anna, M.D.  Jimmye Norman, M.D.  Bernadene Person, M.D.  Family Practice Chart   Consultation Report  HISTORY:  This 45 year old African-American female, previously in good GI health, was referred to me by Dr. Lindie Spruce for possible preoperative ERCP.  The patient was in her usual state of health until several days ago, when she had the onset of rather diffuse lower abdominal pain with some nausea, vomiting and chills.  She had never had a similar episode previously, nor is there any family history of biliary tract disease.  Dark urine was noticed. She presented to the emergency room today, where she was seen by the family practice house staff and arrangements were made for admission.  Of relevance, she was found to have mild elevation in white count to 11,700, no fever, but substantially elevated liver chemistries with a bilirubin of 6.6, alkaline phosphatase 191 and transaminases in the 150-200 range.  Amylase was normal, but lipase was slightly elevated at 56.  The patient has noted some pain in her back as well as rather diffusely in the abdomen as noted.  There was also evidence of possible pyelonephritis characterized by a positive urinary nitrite level, although only a few white cells were identified (many bacteria were seen).  An ultrasound was obtained, she showed a large 2 cm gallstone, a thick-walled gallbladder and no biliary ductal dilatation.  Based on the above findings including upper abdominal tenderness on exam, cholecystectomy is anticipated by Dr. Lindie Spruce, but preoperative clearance of the common duct was felt to be indicated in view of the elevated liver chemistries.  ALLERGIES:  None known.  OUTPATIENT MEDICATIONS:  None.  PAST SURGICAL  HISTORY:  Previous tubal ligation.  PAST MEDICAL HISTORY:  The patient states that she has had an overactive thyroid but, apparently, she identify not on medication for that.  There is no known cardiopulmonary disease, diabetes or hypertension.  FAMILY HISTORY:  Negative for gallbladder disease.  Also negative for other GI tract illnesses such as colon cancer, inflammatory bowel disease, ulcers or liver disease.  SOCIAL HISTORY:  The patient is not married.  She works at the [employer].  She lives in [place].  She is a nonsmoker and nondrinker.  REVIEW OF SYSTEMS:  Please see the HPI.  PHYSICAL EXAMINATION:  GENERAL:  Rather exquisite right upper quadrant tenderness.  The patient is a pleasant but somewhat somnolent African-American female who appears well nourished.  She is not in any evident distress at the time of my exam, but has apparently received some pain medication.  HEENT:  In the room, I do not really appreciate scleral icterus.  There is no significant conjunctival pallor.  CHEST:  Clear to auscultation.  HEART:  Slightly rapid rate.  No murmurs, rubs, gallops, clicks or irregularity of rhythm.  ABDOMEN:  Bowel sounds present.  Somewhat obese.  Rather exquisite right upper quadrant tenderness without peritoneal findings.  No major epigastric or diffuse abdominal tenderness.  RECTAL:  Not performed.  LABORATORY DATA:  White count 11,200 with 87% polys and 8% lymphs.  Hemoglobin 13.0, platelets 418,000.  Metabolic panel pertinent for albumen 2.9, SGOT 157,  SGPT 191, alk phos 287, total bilirubin 6.6.  Urinalysis compatible with UTI as noted above.  IMPRESSION:  Probable acute cholecystitis, possibly with a common duct stone in view of the elevated liver chemistries and mild elevation of lipase.  Less likely would be Mirizzis syndrome or "reactive hepatopathy" as a source of her elevated liver chemistries.  DISCUSSION AND PLAN:  Various  options were discussed with the patient including proceeding directly to ERCP, watching and waiting to see what her liver chemistries do, or proceeding to surgery with common duct exploration if needed based on findings of intraoperative cholangiography.  In my opinion, the patients interests would be best served by proceeding directly to ERCP, since improvement of the liver chemistries would not necessarily mean that the problem was resolved (a stone could be "ball valving" in the common duct).  The absence of biliary ductal dilatation may increase the statistical risk of the procedure for the patient, but the absence of such dilatation does not necessarily imply the absence of a common duct stone, especially in the acute or subacute setting such as this.  I have carefully discussed with the patient the nature, purpose and risks of ERCP, sphincterotomy and stone extraction including the possible risks of mortality with a rate of perhaps 03/998;the risk of possible pancreatitis roughly 5%, but occasionally being very severe or even life threatening; as well as other miscellaneous less likely risks such as bleeding, perforation, infection or cardiopulmonary problems, some of which might prompt the need for emergency surgery.  The patient is agreeable to proceeding with ERCP at this time.  I have discussed the procedure over the telephone with her sister, [name].  We will attempt to obtain this later today, with further management by Dr. Lindie Spruce according to the ERCP findings. DD:  10/03/00 TD:  10/04/00 Job: [id number]

## 2012-05-25 ENCOUNTER — Other Ambulatory Visit: Payer: Self-pay | Admitting: Obstetrics and Gynecology

## 2012-05-25 DIAGNOSIS — IMO0002 Reserved for concepts with insufficient information to code with codable children: Secondary | ICD-10-CM

## 2012-05-26 ENCOUNTER — Ambulatory Visit
Admission: RE | Admit: 2012-05-26 | Discharge: 2012-05-26 | Disposition: A | Discharge status: Home / Self Care | Payer: BC Managed Care – PPO | Source: Ambulatory Visit | Attending: Obstetrics and Gynecology | Admitting: Obstetrics and Gynecology

## 2012-05-26 DIAGNOSIS — IMO0002 Reserved for concepts with insufficient information to code with codable children: Secondary | ICD-10-CM

## 2012-05-26 MED ORDER — IOHEXOL 300 MG/ML  SOLN
100.0000 mL | Freq: Once | INTRAMUSCULAR | Status: AC | PRN
  Administered 2012-05-26: 100 mL via INTRAVENOUS

## 2012-05-27 ENCOUNTER — Encounter (HOSPITAL_COMMUNITY): Payer: Self-pay | Admitting: Pharmacist

## 2012-05-28 LAB — HM PAP SMEAR: HM Pap smear: NORMAL

## 2012-05-30 ENCOUNTER — Inpatient Hospital Stay (HOSPITAL_COMMUNITY): Admission: RE | Admit: 2012-05-30 | Payer: BC Managed Care – PPO | Source: Ambulatory Visit

## 2012-05-31 ENCOUNTER — Other Ambulatory Visit: Payer: Self-pay | Admitting: Obstetrics and Gynecology

## 2012-06-01 ENCOUNTER — Encounter (HOSPITAL_COMMUNITY): Payer: Self-pay

## 2012-06-01 ENCOUNTER — Other Ambulatory Visit: Payer: Self-pay

## 2012-06-01 ENCOUNTER — Encounter (HOSPITAL_COMMUNITY)
Admission: RE | Admit: 2012-06-01 | Discharge: 2012-06-01 | Disposition: A | Discharge status: Home / Self Care | Payer: BC Managed Care – PPO | Source: Ambulatory Visit | Attending: Obstetrics and Gynecology | Admitting: Obstetrics and Gynecology

## 2012-06-01 HISTORY — DX: Essential (primary) hypertension: I10

## 2012-06-01 HISTORY — DX: Thyrotoxicosis, unspecified without thyrotoxic crisis or storm: E05.90

## 2012-06-01 HISTORY — DX: Sleep apnea, unspecified: G47.30

## 2012-06-01 HISTORY — DX: Headache: R51

## 2012-06-01 HISTORY — DX: Type 2 diabetes mellitus without complications: E11.9

## 2012-06-01 HISTORY — DX: Hyperlipidemia, unspecified: E78.5

## 2012-06-01 LAB — BASIC METABOLIC PANEL
BUN: 14 mg/dL (ref 6–23)
CO2: 28 mEq/L (ref 19–32)
Calcium: 10.2 mg/dL (ref 8.4–10.5)
Chloride: 98 mEq/L (ref 96–112)
Creatinine, Ser: 1 mg/dL (ref 0.50–1.10)
GFR calc Af Amer: 77 mL/min — ABNORMAL LOW (ref >90)
GFR calc non Af Amer: 66 mL/min — ABNORMAL LOW (ref >90)
Glucose, Bld: 133 mg/dL — ABNORMAL HIGH (ref 70–99)
Potassium: 5 mEq/L (ref 3.5–5.1)
Sodium: 137 mEq/L (ref 135–145)

## 2012-06-01 LAB — CBC
HCT: 42.8 % (ref 36.0–46.0)
Hemoglobin: 13.5 g/dL (ref 12.0–15.0)
MCH: 23.2 pg — ABNORMAL LOW (ref 26.0–34.0)
MCHC: 31.5 g/dL (ref 30.0–36.0)
MCV: 73.4 fL — ABNORMAL LOW (ref 78.0–100.0)
Platelets: 368 10*3/uL (ref 150–400)
RBC: 5.83 MIL/uL — ABNORMAL HIGH (ref 3.87–5.11)
RDW: 14.1 % (ref 11.5–15.5)
WBC: 6 10*3/uL (ref 4.0–10.5)

## 2012-06-01 LAB — SURGICAL PCR SCREEN
MRSA, PCR: NEGATIVE
Staphylococcus aureus: NEGATIVE

## 2012-06-01 NOTE — Patient Instructions (Addendum)
19 [name]  06/01/2012   Your procedure is scheduled on:  06/02/12  Enter through the Main Entrance of [facility] at 1130 AM.  Pick up the phone at the desk and dial [phone].   Call this number if you have problems the morning of surgery: [phone]   Remember:   Do not eat food:After Midnight.  Do not drink clear liquids: 6 Hours before arrival.  Take these medicines the morning of surgery with A SIP OF WATER: Blood pressure medication. Hold Metformin 24hrs before surgery   Do not wear jewelry, make-up or nail polish.  Do not wear lotions, powders, or perfumes. You may wear deodorant.  Do not shave 48 hours prior to surgery.  Do not bring valuables to the hospital.  Contacts, dentures or bridgework may not be worn into surgery.  Leave suitcase in the car. After surgery it may be brought to your room.  For patients admitted to the hospital, checkout time is 11:00 AM the day of discharge.   Patients discharged the day of surgery will not be allowed to drive home.  Name and phone number of your driver: NA  Special Instructions: Shower using CHG 2 nights before surgery and the night before surgery.  If you shower the day of surgery use CHG.  Use special wash - you have one bottle of CHG for all showers.  You should use approximately 1/3 of the bottle for each shower.   Please read over the following fact sheets that you were given: MRSA Information

## 2012-06-01 NOTE — Pre-Procedure Instructions (Signed)
EKG and medication history reviewed by Dr Malen Gauze. No orders given.

## 2012-06-02 ENCOUNTER — Encounter (HOSPITAL_COMMUNITY): Payer: Self-pay | Admitting: Anesthesiology

## 2012-06-02 ENCOUNTER — Encounter (HOSPITAL_COMMUNITY): Admission: RE | Payer: Self-pay | Source: Ambulatory Visit

## 2012-06-02 ENCOUNTER — Encounter (HOSPITAL_COMMUNITY)
Admission: RE | Disposition: A | Discharge status: Home / Self Care | Payer: Self-pay | Source: Ambulatory Visit | Attending: Obstetrics and Gynecology

## 2012-06-02 ENCOUNTER — Ambulatory Visit (HOSPITAL_COMMUNITY)
Admission: RE | Admit: 2012-06-02 | Payer: BC Managed Care – PPO | Source: Ambulatory Visit | Admitting: Obstetrics and Gynecology

## 2012-06-02 ENCOUNTER — Ambulatory Visit (HOSPITAL_COMMUNITY)
Admission: RE | Admit: 2012-06-02 | Discharge: 2012-06-02 | Disposition: A | Discharge status: Home / Self Care | Payer: BC Managed Care – PPO | Source: Ambulatory Visit | Attending: Obstetrics and Gynecology | Admitting: Obstetrics and Gynecology

## 2012-06-02 ENCOUNTER — Ambulatory Visit (HOSPITAL_COMMUNITY): Payer: BC Managed Care – PPO | Admitting: Anesthesiology

## 2012-06-02 DIAGNOSIS — D282 Benign neoplasm of uterine tubes and ligaments: Secondary | ICD-10-CM | POA: Insufficient documentation

## 2012-06-02 DIAGNOSIS — Z90721 Acquired absence of ovaries, unilateral: Secondary | ICD-10-CM

## 2012-06-02 DIAGNOSIS — N9489 Other specified conditions associated with female genital organs and menstrual cycle: Secondary | ICD-10-CM | POA: Insufficient documentation

## 2012-06-02 DIAGNOSIS — Z90710 Acquired absence of both cervix and uterus: Secondary | ICD-10-CM | POA: Insufficient documentation

## 2012-06-02 DIAGNOSIS — Z9889 Other specified postprocedural states: Secondary | ICD-10-CM

## 2012-06-02 HISTORY — PX: ROBOT ASSISTED MYOMECTOMY: SHX5142

## 2012-06-02 HISTORY — PX: ROBOTIC ASSISTED LAPAROSCOPIC LYSIS OF ADHESION: SHX6080

## 2012-06-02 HISTORY — PX: ROBOTIC ASSISTED SALPINGO OOPHERECTOMY: SHX6082

## 2012-06-02 LAB — GLUCOSE, CAPILLARY
Glucose-Capillary: 89 mg/dL (ref 70–99)
Glucose-Capillary: 122 mg/dL — ABNORMAL HIGH (ref 70–99)

## 2012-06-02 SURGERY — ROBOTIC ASSISTED LAPAROSCOPIC LYSIS OF ADHESION
Anesthesia: General | Site: Abdomen | Wound class: Clean Contaminated

## 2012-06-02 SURGERY — ROBOTIC ASSISTED BILATERAL SALPINGO OOPHORECTOMY
Anesthesia: General | Laterality: Bilateral

## 2012-06-02 MED ORDER — OXYCODONE-ACETAMINOPHEN 5-325 MG PO TABS
ORAL_TABLET | ORAL | Status: AC
  Administered 2012-06-02: 1 via ORAL
  Filled 2012-06-02: qty 1

## 2012-06-02 MED ORDER — FENTANYL CITRATE 0.05 MG/ML IJ SOLN: 25.0000 ug | INTRAMUSCULAR | Status: DC | PRN

## 2012-06-02 MED ORDER — DEXAMETHASONE SODIUM PHOSPHATE 10 MG/ML IJ SOLN
INTRAMUSCULAR | Status: AC
  Filled 2012-06-02: qty 1

## 2012-06-02 MED ORDER — PHENYLEPHRINE HCL 10 MG/ML IJ SOLN
INTRAMUSCULAR | Status: DC | PRN
  Administered 2012-06-02 (×3): 40 mg via INTRAVENOUS
  Administered 2012-06-02 (×4): 80 mg via INTRAVENOUS
  Administered 2012-06-02: 40 mg via INTRAVENOUS

## 2012-06-02 MED ORDER — BUPIVACAINE HCL (PF) 0.25 % IJ SOLN
INTRAMUSCULAR | Status: AC
  Filled 2012-06-02: qty 30

## 2012-06-02 MED ORDER — LIDOCAINE HCL (CARDIAC) 20 MG/ML IV SOLN
INTRAVENOUS | Status: AC
  Filled 2012-06-02: qty 5

## 2012-06-02 MED ORDER — FENTANYL CITRATE 0.05 MG/ML IJ SOLN
INTRAMUSCULAR | Status: DC | PRN
  Administered 2012-06-02 (×5): 50 ug via INTRAVENOUS
  Administered 2012-06-02: 100 ug via INTRAVENOUS

## 2012-06-02 MED ORDER — EPHEDRINE 5 MG/ML INJ
INTRAVENOUS | Status: AC
  Filled 2012-06-02: qty 10

## 2012-06-02 MED ORDER — CEFAZOLIN SODIUM-DEXTROSE 2-3 GM-% IV SOLR
INTRAVENOUS | Status: AC
  Filled 2012-06-02: qty 50

## 2012-06-02 MED ORDER — ACETAMINOPHEN 10 MG/ML IV SOLN
1000.0000 mg | Freq: Four times a day (QID) | INTRAVENOUS | Status: DC
  Administered 2012-06-02: 1000 mg via INTRAVENOUS

## 2012-06-02 MED ORDER — METOCLOPRAMIDE HCL 5 MG/ML IJ SOLN: 10.0000 mg | Freq: Once | INTRAMUSCULAR | Status: DC | PRN

## 2012-06-02 MED ORDER — FENTANYL CITRATE 0.05 MG/ML IJ SOLN
INTRAMUSCULAR | Status: AC
  Administered 2012-06-02: 25 ug via INTRAVENOUS
  Filled 2012-06-02: qty 2

## 2012-06-02 MED ORDER — NEOSTIGMINE METHYLSULFATE 1 MG/ML IJ SOLN
INTRAMUSCULAR | Status: AC
  Filled 2012-06-02: qty 1

## 2012-06-02 MED ORDER — PHENYLEPHRINE 40 MCG/ML (10ML) SYRINGE FOR IV PUSH (FOR BLOOD PRESSURE SUPPORT)
PREFILLED_SYRINGE | INTRAVENOUS | Status: AC
  Filled 2012-06-02: qty 5

## 2012-06-02 MED ORDER — ROCURONIUM BROMIDE 100 MG/10ML IV SOLN
INTRAVENOUS | Status: DC | PRN
  Administered 2012-06-02 (×2): 10 mg via INTRAVENOUS
  Administered 2012-06-02: 40 mg via INTRAVENOUS
  Administered 2012-06-02 (×2): 5 mg via INTRAVENOUS

## 2012-06-02 MED ORDER — PROPOFOL 10 MG/ML IV EMUL
INTRAVENOUS | Status: AC
  Filled 2012-06-02: qty 20

## 2012-06-02 MED ORDER — MIDAZOLAM HCL 5 MG/5ML IJ SOLN
INTRAMUSCULAR | Status: DC | PRN
  Administered 2012-06-02: 2 mg via INTRAVENOUS

## 2012-06-02 MED ORDER — KETOROLAC TROMETHAMINE 30 MG/ML IJ SOLN
INTRAMUSCULAR | Status: AC
  Filled 2012-06-02: qty 1

## 2012-06-02 MED ORDER — LIDOCAINE HCL (CARDIAC) 20 MG/ML IV SOLN
INTRAVENOUS | Status: DC | PRN
  Administered 2012-06-02: 80 mg via INTRAVENOUS

## 2012-06-02 MED ORDER — BUPIVACAINE HCL (PF) 0.25 % IJ SOLN
INTRAMUSCULAR | Status: DC | PRN
  Administered 2012-06-02: 20 mL

## 2012-06-02 MED ORDER — ACETAMINOPHEN 10 MG/ML IV SOLN
INTRAVENOUS | Status: AC
  Filled 2012-06-02: qty 100

## 2012-06-02 MED ORDER — STERILE WATER FOR IRRIGATION IR SOLN
Status: DC | PRN
  Administered 2012-06-02: 1000 mL via INTRAVESICAL

## 2012-06-02 MED ORDER — KETOROLAC TROMETHAMINE 60 MG/2ML IM SOLN
INTRAMUSCULAR | Status: DC | PRN
  Administered 2012-06-02: 30 mg via INTRAMUSCULAR

## 2012-06-02 MED ORDER — SODIUM CHLORIDE 0.9 % IJ SOLN
INTRAMUSCULAR | Status: DC | PRN
  Administered 2012-06-02: 7 mL

## 2012-06-02 MED ORDER — ONDANSETRON HCL 4 MG/2ML IJ SOLN
INTRAMUSCULAR | Status: DC | PRN
  Administered 2012-06-02: 4 mg via INTRAVENOUS

## 2012-06-02 MED ORDER — MIDAZOLAM HCL 2 MG/2ML IJ SOLN
INTRAMUSCULAR | Status: AC
  Filled 2012-06-02: qty 2

## 2012-06-02 MED ORDER — EPHEDRINE SULFATE 50 MG/ML IJ SOLN
INTRAMUSCULAR | Status: DC | PRN
  Administered 2012-06-02: 10 mg via INTRAVENOUS

## 2012-06-02 MED ORDER — PHENYLEPHRINE 40 MCG/ML (10ML) SYRINGE FOR IV PUSH (FOR BLOOD PRESSURE SUPPORT)
PREFILLED_SYRINGE | INTRAVENOUS | Status: AC
  Filled 2012-06-02: qty 10

## 2012-06-02 MED ORDER — MEPERIDINE HCL 25 MG/ML IJ SOLN: 6.2500 mg | INTRAMUSCULAR | Status: DC | PRN

## 2012-06-02 MED ORDER — NEOSTIGMINE METHYLSULFATE 1 MG/ML IJ SOLN
INTRAMUSCULAR | Status: DC | PRN
  Administered 2012-06-02: 3 mg via INTRAVENOUS

## 2012-06-02 MED ORDER — OXYCODONE-ACETAMINOPHEN 5-325 MG PO TABS: 1.0000 | ORAL_TABLET | ORAL | Status: DC | PRN

## 2012-06-02 MED ORDER — HEPARIN SODIUM (PORCINE) 5000 UNIT/ML IJ SOLN
INTRAMUSCULAR | Status: DC | PRN
  Administered 2012-06-02: 5000 [IU]

## 2012-06-02 MED ORDER — ONDANSETRON HCL 4 MG/2ML IJ SOLN
INTRAMUSCULAR | Status: AC
  Filled 2012-06-02: qty 2

## 2012-06-02 MED ORDER — PROPOFOL 10 MG/ML IV EMUL
INTRAVENOUS | Status: DC | PRN
  Administered 2012-06-02: 200 mg via INTRAVENOUS

## 2012-06-02 MED ORDER — LACTATED RINGERS IV SOLN
INTRAVENOUS | Status: DC
  Administered 2012-06-02 (×4): via INTRAVENOUS

## 2012-06-02 MED ORDER — LACTATED RINGERS IR SOLN
Status: DC | PRN
  Administered 2012-06-02: 3000 mL

## 2012-06-02 MED ORDER — GLYCOPYRROLATE 0.2 MG/ML IJ SOLN
INTRAMUSCULAR | Status: DC | PRN
  Administered 2012-06-02: 0.6 mg via INTRAVENOUS

## 2012-06-02 MED ORDER — GLYCOPYRROLATE 0.2 MG/ML IJ SOLN
INTRAMUSCULAR | Status: AC
  Filled 2012-06-02: qty 3

## 2012-06-02 MED ORDER — CEFAZOLIN SODIUM-DEXTROSE 2-3 GM-% IV SOLR
2.0000 g | INTRAVENOUS | Status: AC
  Administered 2012-06-02: 2 g via INTRAVENOUS

## 2012-06-02 MED ORDER — FENTANYL CITRATE 0.05 MG/ML IJ SOLN
INTRAMUSCULAR | Status: AC
  Filled 2012-06-02: qty 2

## 2012-06-02 MED ORDER — ROCURONIUM BROMIDE 50 MG/5ML IV SOLN
INTRAVENOUS | Status: AC
  Filled 2012-06-02: qty 1

## 2012-06-02 MED ORDER — FENTANYL CITRATE 0.05 MG/ML IJ SOLN
INTRAMUSCULAR | Status: AC
  Filled 2012-06-02: qty 5

## 2012-06-02 SURGICAL SUPPLY — 61 items
ADH SKN CLS APL DERMABOND .7 (GAUZE/BANDAGES/DRESSINGS) ×3
BARRIER ADHS 3X4 INTERCEED (GAUZE/BANDAGES/DRESSINGS) ×4 IMPLANT
BLADE SURG 10 STRL SS (BLADE) ×4 IMPLANT
BRR ADH 4X3 ABS CNTRL BYND (GAUZE/BANDAGES/DRESSINGS) ×3
CHLORAPREP W/TINT 26ML (MISCELLANEOUS) ×4 IMPLANT
CONT PATH 16OZ SNAP LID 3702 (MISCELLANEOUS) ×4 IMPLANT
COVER MAYO STAND STRL (DRAPES) ×4 IMPLANT
COVER TABLE BACK 60X90 (DRAPES) ×10 IMPLANT
COVER TIP SHEARS 8 DVNC (MISCELLANEOUS) ×3 IMPLANT
COVER TIP SHEARS 8MM DA VINCI (MISCELLANEOUS) ×1
DECANTER SPIKE VIAL GLASS SM (MISCELLANEOUS) ×6 IMPLANT
DERMABOND ADVANCED (GAUZE/BANDAGES/DRESSINGS) ×1
DERMABOND ADVANCED .7 DNX12 (GAUZE/BANDAGES/DRESSINGS) ×3 IMPLANT
DRAPE CAMERA ARM DA VINCI (DRAPE) ×2 IMPLANT
DRAPE HUG U DISPOSABLE (DRAPE) ×4 IMPLANT
DRAPE LG THREE QUARTER DISP (DRAPES) ×8 IMPLANT
DRAPE WARM FLUID 44X44 (DRAPE) ×4 IMPLANT
ELECT REM PT RETURN 9FT ADLT (ELECTROSURGICAL) ×4
ELECTRODE REM PT RTRN 9FT ADLT (ELECTROSURGICAL) ×3 IMPLANT
EVACUATOR SMOKE 8.L (FILTER) ×6 IMPLANT
GAUZE VASELINE 3X9 (GAUZE/BANDAGES/DRESSINGS) IMPLANT
GLOVE BIO SURGEON STRL SZ 6.5 (GLOVE) ×16 IMPLANT
GLOVE BIO SURGEON STRL SZ7 (GLOVE) ×2 IMPLANT
GLOVE BIOGEL PI IND STRL 7.0 (GLOVE) ×11 IMPLANT
GLOVE BIOGEL PI INDICATOR 7.0 (GLOVE) ×7
GOWN STRL REIN XL XLG (GOWN DISPOSABLE) ×32 IMPLANT
KIT ACCESSORY DA VINCI DISP (KITS) ×1
KIT ACCESSORY DVNC DISP (KITS) ×3 IMPLANT
LEGGING LITHOTOMY PAIR STRL (DRAPES) ×4 IMPLANT
NEEDLE INSUFFLATION 120MM (ENDOMECHANICALS) ×4 IMPLANT
NS IRRIG 1000ML POUR BTL (IV SOLUTION) ×12 IMPLANT
OCCLUDER COLPOPNEUMO (BALLOONS) IMPLANT
PACK LAVH (CUSTOM PROCEDURE TRAY) ×4 IMPLANT
PAD PREP 24X48 CUFFED NSTRL (MISCELLANEOUS) ×8 IMPLANT
PROTECTOR NERVE ULNAR (MISCELLANEOUS) ×8 IMPLANT
SCISSORS LAP 5X35 DISP (ENDOMECHANICALS) ×2 IMPLANT
SCISSORS LAP 5X45 EPIX DISP (ENDOMECHANICALS) ×2 IMPLANT
SET CYSTO W/LG BORE CLAMP LF (SET/KITS/TRAYS/PACK) ×2 IMPLANT
SET IRRIG TUBING LAPAROSCOPIC (IRRIGATION / IRRIGATOR) ×4 IMPLANT
SOLUTION ELECTROLUBE (MISCELLANEOUS) ×4 IMPLANT
SUT VIC AB 0 CT1 27 (SUTURE) ×20
SUT VIC AB 0 CT1 27XBRD ANTBC (SUTURE) ×15 IMPLANT
SUT VICRYL 0 UR6 27IN ABS (SUTURE) ×4 IMPLANT
SUT VICRYL 4-0 PS2 18IN ABS (SUTURE) ×8 IMPLANT
SYR 50ML LL SCALE MARK (SYRINGE) ×4 IMPLANT
SYS RETRIEVAL 5MM INZII UNIV (BASKET) ×4
SYSTEM CONVERTIBLE TROCAR (TROCAR) IMPLANT
SYSTEM RETRIEVL 5MM INZII UNIV (BASKET) ×1 IMPLANT
TIP UTERINE 5.1X6CM LAV DISP (MISCELLANEOUS) IMPLANT
TIP UTERINE 6.7X10CM GRN DISP (MISCELLANEOUS) IMPLANT
TIP UTERINE 6.7X6CM WHT DISP (MISCELLANEOUS) IMPLANT
TIP UTERINE 6.7X8CM BLUE DISP (MISCELLANEOUS) IMPLANT
TOWEL OR 17X24 6PK STRL BLUE (TOWEL DISPOSABLE) ×12 IMPLANT
TRAY FOLEY BAG SILVER LF 14FR (CATHETERS) ×4 IMPLANT
TROCAR 12M 150ML BLUNT (TROCAR) IMPLANT
TROCAR DILATING TIP 12MM 150MM (ENDOMECHANICALS) ×4 IMPLANT
TROCAR DISP BLADELESS 8 DVNC (TROCAR) ×3 IMPLANT
TROCAR DISP BLADELESS 8MM (TROCAR) ×1
TROCAR OPTI TIP 12M 100M (ENDOMECHANICALS) ×4 IMPLANT
TUBING FILTER THERMOFLATOR (ELECTROSURGICAL) ×4 IMPLANT
WARMER LAPAROSCOPE (MISCELLANEOUS) ×4 IMPLANT

## 2012-06-02 NOTE — Transfer of Care (Signed)
Immediate Anesthesia Transfer of Care Note  Patient: Addley Ballinger  Procedure(s) Performed: Procedure(s) with comments: ROBOTIC ASSISTED BILATERAL SALPINGO OOPHORECTOMY (Bilateral) -  pelvic washings. ROBOTIC ASSISTED LAPAROSCOPIC LYSIS OF ADHESION (N/A)  Patient Location: PACU  Anesthesia Type:General  Level of Consciousness: awake, alert , oriented and patient cooperative  Airway & Oxygen Therapy: Patient Spontanous Breathing and Patient connected to face mask oxygen  Post-op Assessment: Report given to PACU RN and Post -op Vital signs reviewed and stable  Post vital signs: Reviewed and stable  Complications: No apparent anesthesia complications

## 2012-06-02 NOTE — Brief Op Note (Signed)
06/02/2012  4:57 PM  PATIENT:  [name]  47 y.o. female  PRE-OPERATIVE DIAGNOSIS:   Left  Solid Adnexal Mass, Previous hysterectomy  POST-OPERATIVE DIAGNOSIS:  Pelvic adhesions. Left retroperitoneal fibroid, previous hysterectomy  PROCEDURE:  DaVinci robotic assisted left salpingo-oophrectomy, robotic assisted lysis of adhesions, myomectomy, pelvic washings  SURGEON:  Surgeon(s) and Role:    * Ayoub Arey Cathie Beams, MD - Primary      PHYSICIAN ASSISTANT:   ASSISTANTS: Genia Del, MD   ANESTHESIA:   general FINDINGS; left 5-6 cm solid mass retroperitoneal c/w fibroid and attached to left tube and ovary, pelvic adhesion to cuff, right ovary nl but adherent in part to bowel and displaced at pelvic brim, omental adhesion at umbilicus. Right upper abdominal adhesions blanket visibility of liver EBL:  Total I/O In: 2000 [I.V.:2000] Out: 425 [Urine:300; Blood:125]  BLOOD ADMINISTERED:none  DRAINS: none   LOCAL MEDICATIONS USED:  MARCAINE     SPECIMEN:  Source of Specimen:  left tube and ovary with morcellated fibroid  DISPOSITION OF SPECIMEN:  PATHOLOGY  COUNTS:  YES  TOURNIQUET:  * No tourniquets in log *  DICTATION: .Other Dictation: Dictation Number [id number]  PLAN OF CARE: Discharge to home after PACU  PATIENT DISPOSITION:  PACU - hemodynamically stable.   Delay start of Pharmacological VTE agent (>24hrs) due to surgical blood loss or risk of bleeding: no

## 2012-06-02 NOTE — Preoperative (Signed)
Beta Blockers   Reason not to administer Beta Blockers:Hold beta blocker due to other;patient took medication at home this a.m.

## 2012-06-02 NOTE — Anesthesia Preprocedure Evaluation (Signed)
Anesthesia Evaluation  Patient identified by MRN, date of birth, ID band Patient awake    Reviewed: Allergy & Precautions, H&P , NPO status , Patient's Chart, lab work & pertinent test results  Airway Mallampati: II TM Distance: >3 FB Neck ROM: Full    Dental no notable dental hx. (+) Teeth Intact   Pulmonary sleep apnea and Continuous Positive Airway Pressure Ventilation ,  breath sounds clear to auscultation  Pulmonary exam normal       Cardiovascular hypertension, Pt. on medications and Pt. on home beta blockers Rhythm:Regular Rate:Normal     Neuro/Psych  Headaches, PSYCHIATRIC DISORDERS Depression    GI/Hepatic negative GI ROS, Neg liver ROS,   Endo/Other  diabetes, Well Controlled, Type 2, Oral Hypoglycemic AgentsHyperthyroidism   Renal/GU negative Renal ROS  negative genitourinary   Musculoskeletal negative musculoskeletal ROS (+)   Abdominal   Peds  Hematology  (+) Blood dyscrasia, anemia ,   Anesthesia Other Findings   Reproductive/Obstetrics Adnexal Mass                           Anesthesia Physical Anesthesia Plan  ASA: II  Anesthesia Plan: General   Post-op Pain Management:    Induction: Intravenous  Airway Management Planned: Oral ETT  Additional Equipment:   Intra-op Plan:   Post-operative Plan: Extubation in OR  Informed Consent: I have reviewed the patients History and Physical, chart, labs and discussed the procedure including the risks, benefits and alternatives for the proposed anesthesia with the patient or authorized representative who has indicated his/her understanding and acceptance.   Dental advisory given  Plan Discussed with: CRNA, Anesthesiologist and Surgeon  Anesthesia Plan Comments:         Anesthesia Quick Evaluation

## 2012-06-02 NOTE — Anesthesia Postprocedure Evaluation (Signed)
  Anesthesia Post-op Note  Anesthesia Post Note  Patient: [name]  Procedure(s) Performed: Procedure(s) (LRB): ROBOTIC ASSISTED LAPAROSCOPIC LYSIS OF ADHESION (N/A) ROBOTIC ASSISTED SALPINGO OOPHORECTOMY (Left) ROBOTIC ASSISTED MYOMECTOMY (Left)  Anesthesia type: General  Patient location: PACU  Post pain: Pain level controlled  Post assessment: Post-op Vital signs reviewed  Last Vitals:  Filed Vitals:   06/02/12 1900  BP:   Pulse: 91  Temp:   Resp: 17    Post vital signs: Reviewed  Level of consciousness: sedated  Complications: No apparent anesthesia complications

## 2012-06-03 MED FILL — Heparin Sodium (Porcine) Inj 5000 Unit/ML: INTRAMUSCULAR | Qty: 1 | Status: AC

## 2012-06-04 ENCOUNTER — Encounter (HOSPITAL_COMMUNITY): Payer: Self-pay | Admitting: Obstetrics and Gynecology

## 2012-06-06 NOTE — Op Note (Signed)
[name], [name] NO.:  [id number]  MEDICAL RECORD NO.:  [id number]  LOCATION:  PERIO                         FACILITY:  [facility]  PHYSICIAN:  Maxie Better, M.D.DATE OF BIRTH:  [birth year]  DATE OF PROCEDURE:  06/02/2012 DATE OF DISCHARGE:                              OPERATIVE REPORT   PREOPERATIVE DIAGNOSIS:  Previous abdominal hysterectomy, left adnexal mass.  POSTOPERATIVE DIAGNOSIS:  Left adnexal mass consistent with left intraligamentous fibroid, pelvic adhesions, status post hysterectomy.  PROCEDURE:  Da Vinci robotic left salpingo-oophorectomy, left intraligamentous myomectomy, robotic lysis of adhesions, pelvic washings.  ANESTHESIA:  General.  SURGEON:  Maxie Better, M.D.  ASSISTANT:  Genia Del, M.D.  PROCEDURE:  Under adequate general anesthesia, the patient was placed in the dorsal lithotomy position.  She was positioned for robotic surgery. She was sterilely prepped and draped in usual fashion.  A 3-way Foley catheter was sterilely placed.  Examination under anesthesia revealed about a 5-cm mass to the left of the vaginal cuff.  The patient was sterilely prepped and draped in usual fashion.  Attention was then turned to the abdomen.  The patient had several previous surgeries, a supraumbilical previous incision was noted, was injected with 0.25% Marcaine and opened transversely.  Veress needle was introduced, tested with a drop test.  A 3 liters of CO2 was insufflated.  Veress needle was then removed.  A 10-mm disposable trocar with sleeve was introduced into the abdomen.  The robotic camera port was then placed.  The camera was then placed through that port.  On entry, it was noted that the omentum was there at the umbilicus.  The patient had previous gallbladder surgery and the upper abdomen could not be seen on the right side. Coursing the camera around that omental adhesion,the pelvis otherwise could be seen.   The patient was placed in deep Trendelenburg position.  There was evidence of adhesions at the vaginal cuff and focal areas throughout the pelvis.  The right ovary initially could not be seen nor was its tube seen. A very smooth- Appearing approximately 5-6 cm mass adjacent to and contiguous with the left tube and ovary was noted. There was no evidence of any lesions on the anterior peritoneum, anterior abdominal wall, or around that lesion.  A 500 mL of fluid was instilled in the abdomen.  A 375 mL was removed and sent for pelvic washings.  The procedure was then started, with additional robotic ports sites being placed, two on the left 8-mm robotic ports 10 cm apart and on the right, a 5-mm assistant port with an 8-mm robotic port site was also placed.  The robot was then docked to the patient's left in the arm #1, Monopolar scissors, arm #2, was the PK dissector, and arm #3, the ProGrasp.  I then went to the surgical console.  At the surgical console, the adhesions in the posterior cul-de-sac was removed.  The epiploica adhesion of the bowel to the mass and to the vaginal cuff was also lysed.  The left retroperitoneal space was then opened.  The round ligament was identified and the mass was underneath that and well circumscribed, but attached to that ovary.  The adhesions of mass to the vaginal cuff anteriorly was noted.  The round ligament was grasped, cauterized and cut.  The left retroperitoneal space was opened and careful dissection was done, but the ureter was not easily seen, it was clearly deep in the pelvis, based on the depth of the dissection that had been done.  The round ligament opening was extended anteriorly overlying this mass, which appeared to be a fibroid.  The area was enucleated partially.  In terms of fibroid, the left IP ligament was then serially clamped, cauterized, and then cut.  This brought the mass further up away from the pelvic sidewall.  Continued  dissection was then performed, with the bladder being retrofilled with 300 mL of fluid to identify its location with respect to with the mass and the vaginal cuff.  The mass was then continued to be sharply dissected off the cuff and ultimately was then removed and identified as probably a benign intraligamentous fibroid.  Remaining scar tissues were then cut.  The right ovary was found to be up at the level of the pelvic brim on the right with adhesions of the bowel around it.  Some of the adhesions were lysed; however, given the fact that this mass on the left appeared to be benign, the initial plan of removing both ovaries was then forgone.  The partial adhesions on the right ovary was removed, the entire adhesions to the bowel from that area was not touched due to the marked involvement, which would possibly compromise that ovary, which was not going to be removed and therefore, it was left in place since the patient was asymptomatic.  The robotic camera was removed, endobag was in place and the tube and ovary with the mass attached was then pushed in the bag.  The robot was undocked due to inability to cut the specimen in the bag with it in place.  An 8-mm robotic camera was introduced. The robot was removed from the patient's bedside.  Initially, using the hot scissors and the tenaculum, the mass was partially dissected within the bag.  After that was done, the endobag was closed.  The specimen was brought up to the umbilicus, that incision was extended including to the level of the fascia and the bag was then opened externally and the specimen was then brought out, it was chopped up in pieces, brought in the bag and then brought out entirely.  The specimen itself weighed 99 g.  Once this was done, the fascia was identified at the supraumbilical site and was closed with running stitch of 0 Vicryl.  The abdomen was then reinsufflated.  The pelvis was inspected, irrigated, suction,  no evidence of any problems there.  At that point, the robotic ports sites were removed.  The incisions were then closed with 4-0 Vicryl subcuticular sutures.  Specimen was the left tube and ovary with the fibroid was morcellated and sent to Pathology.  ESTIMATED BLOOD LOSS:  About 50 mL.  INTRAOPERATIVE FLUIDS:  1 L.  URINE OUTPUT:  200 mL.  COMPLICATION:  None.  Sponge and instrument counts x2 was correct.  The patient tolerated the procedure well, was transferred to recovery room in stable condition.     Maxie Better, M.D.     [place]/[facility]  D:  06/02/2012  T:  06/03/2012  Job:  [id number]

## 2012-08-24 ENCOUNTER — Encounter: Payer: Self-pay | Admitting: Internal Medicine

## 2012-11-24 ENCOUNTER — Telehealth: Payer: Self-pay | Admitting: Endocrinology

## 2012-11-24 MED ORDER — METFORMIN HCL 1000 MG PO TABS: 1000.0000 mg | ORAL_TABLET | Freq: Two times a day (BID) | ORAL | Status: DC

## 2012-11-24 NOTE — Telephone Encounter (Signed)
Okay to refill, make sure she has followup appointment and previous records

## 2012-11-24 NOTE — Telephone Encounter (Signed)
PATIENT NEEDS A RX FOR THE METFORMIN SHE IS OUT PLEASE CALL PATIENT BACK, okay to fill

## 2012-11-28 LAB — HM MAMMOGRAPHY: HM Mammogram: NORMAL

## 2012-12-28 ENCOUNTER — Encounter: Payer: Self-pay | Admitting: Endocrinology

## 2012-12-28 ENCOUNTER — Other Ambulatory Visit: Payer: Self-pay | Admitting: *Deleted

## 2012-12-28 ENCOUNTER — Ambulatory Visit (INDEPENDENT_AMBULATORY_CARE_PROVIDER_SITE_OTHER): Payer: BC Managed Care – PPO | Admitting: Endocrinology

## 2012-12-28 VITALS — BP 118/80 | HR 79 | Temp 98.5°F | Resp 12 | Ht 63.0 in | Wt 135.8 lb

## 2012-12-28 DIAGNOSIS — I10 Essential (primary) hypertension: Secondary | ICD-10-CM

## 2012-12-28 DIAGNOSIS — E119 Type 2 diabetes mellitus without complications: Secondary | ICD-10-CM | POA: Insufficient documentation

## 2012-12-28 DIAGNOSIS — E7800 Pure hypercholesterolemia, unspecified: Secondary | ICD-10-CM

## 2012-12-28 DIAGNOSIS — E042 Nontoxic multinodular goiter: Secondary | ICD-10-CM

## 2012-12-28 HISTORY — DX: Nontoxic multinodular goiter: E04.2

## 2012-12-28 LAB — COMPREHENSIVE METABOLIC PANEL
ALT: 8 U/L (ref 0–35)
AST: 14 U/L (ref 0–37)
Albumin: 3.9 g/dL (ref 3.5–5.2)
Alkaline Phosphatase: 47 U/L (ref 39–117)
BUN: 10 mg/dL (ref 6–23)
CO2: 28 mEq/L (ref 19–32)
Calcium: 9.4 mg/dL (ref 8.4–10.5)
Chloride: 102 mEq/L (ref 96–112)
Creatinine, Ser: 0.8 mg/dL (ref 0.4–1.2)
GFR: 106.13 mL/min (ref >60.00)
Glucose, Bld: 86 mg/dL (ref 70–99)
Potassium: 4.7 mEq/L (ref 3.5–5.1)
Sodium: 137 mEq/L (ref 135–145)
Total Bilirubin: 0.4 mg/dL (ref 0.3–1.2)
Total Protein: 7.4 g/dL (ref 6.0–8.3)

## 2012-12-28 LAB — HEMOGLOBIN A1C: Hgb A1c MFr Bld: 7 % — ABNORMAL HIGH (ref 4.6–6.5)

## 2012-12-28 MED ORDER — PRAVASTATIN SODIUM 40 MG PO TABS: 40.0000 mg | ORAL_TABLET | Freq: Every day | ORAL | Status: DC

## 2012-12-28 MED ORDER — VALSARTAN 80 MG PO TABS: 80.0000 mg | ORAL_TABLET | Freq: Every day | ORAL | Status: DC

## 2012-12-28 NOTE — Progress Notes (Signed)
Patient ID: [name], female   DOB: Sep 23, 1965, 47 y.o.   MRN: [id number]  [name] is an 47 y.o. female.   Reason for Appointment: Diabetes follow-up   History of Present Illness   Diagnosis: Type 2 DIABETES MELITUS  Previous history: Her previous records are not available at present  Recent history: Her blood sugars appear to be very well controlled with continuing her Victoza along with metformin. She also has done very well with losing weight over the last few months     Oral hypoglycemic drugs: Metformin Side effects from medications: None       Monitors blood glucose: Once a day.    Glucometer: One Touch.          Blood Glucose readings from meter download: readings before breakfast:  Mornings 96-119, p.c. breakfast highest 165. Midday and afternoon 90-134 and around 8 PM 110, 112. Not checking any postprandial readings Hypoglycemia frequency:  none        Meals: 3 meals per day.          Physical activity: exercise: walking regularly and is relatively active at work also              The last HbgA1c was reported as ?   Wt Readings from Last 3 Encounters:  12/28/12 135 lb 12.8 oz (61.598 kg)  06/01/12 159 lb (72.122 kg)  07/29/07 181 lb 6.1 oz (82.274 kg)      Medication List       This list is accurate as of: 12/28/12  3:28 PM.  Always use your most recent med list.               aspirin-acetaminophen-caffeine 250-250-65 MG per tablet  Commonly known as:  EXCEDRIN MIGRAINE  Take 1 tablet by mouth every 6 (six) hours as needed for pain (headache).     metFORMIN 1000 MG tablet  Commonly known as:  GLUCOPHAGE  Take 1 tablet (1,000 mg total) by mouth 2 (two) times daily with a meal.     multivitamin with minerals Tabs tablet  Take 1 tablet by mouth daily.     nebivolol 5 MG tablet  Commonly known as:  BYSTOLIC  Take 5 mg by mouth daily.     oxyCODONE-acetaminophen 5-325 MG per tablet  Commonly known as:  ROXICET  Take 1 tablet by mouth every 4 (four)  hours as needed for pain.     pravastatin 40 MG tablet  Commonly known as:  PRAVACHOL  Take 40 mg by mouth daily.     valsartan 80 MG tablet  Commonly known as:  DIOVAN  Take 80 mg by mouth daily.     valsartan-hydrochlorothiazide 80-12.5 MG per tablet  Commonly known as:  DIOVAN-HCT  80 tablets.     VICTOZA 18 MG/3ML Soln injection  Generic drug:  Liraglutide  Inject 1.2 mg into the skin daily.        Allergies: No Known Allergies  Past Medical History  Diagnosis Date  . Hypertension   . Diabetes mellitus without complication   . Hyperthyroidism   . Sleep apnea   . Headache(784.0)     migraine  . Hyperlipidemia     Past Surgical History  Procedure Laterality Date  . Abdominal hysterectomy    . Cholecystectomy    . Robotic assisted laparoscopic lysis of adhesion N/A 06/02/2012    Procedure: ROBOTIC ASSISTED LAPAROSCOPIC LYSIS OF ADHESION;  Surgeon: Serita Kyle, MD;  Location: [facility];  Service: Gynecology;  Laterality: N/A;  . Robotic assisted salpingo oopherectomy Left 06/02/2012    Procedure: ROBOTIC ASSISTED SALPINGO OOPHORECTOMY;  Surgeon: Serita Kyle, MD;  Location: [facility];  Service: Gynecology;  Laterality: Left;  pelvic washings  . Robot assisted myomectomy Left 06/02/2012    Procedure: ROBOTIC ASSISTED MYOMECTOMY;  Surgeon: Serita Kyle, MD;  Location: [facility];  Service: Gynecology;  Laterality: Left;  anexal mass is a myoma left side    No family history on file.  Social History:  reports that she has never smoked. She does not have any smokeless tobacco history on file. She reports that she does not drink alcohol or use illicit drugs.  Review of Systems:  Hypertension:  she has been out of her Diovan HCT prescription for about 1 week, previously also on Bystolic which was stopped  Lipids: Has been on pravastatin long-standing    She has a long-standing history of multinodular goiter. No local discomfort, pressure sensation or  difficulty swallowing   Examination:   BP 118/80  Pulse 79  Temp(Src) 98.5 F (36.9 C)  Resp 12  Ht 5\' 3"  (1.6 m)  Wt 135 lb 12.8 oz (61.598 kg)  BMI 24.06 kg/m2  SpO2 99%  Body mass index is 24.06 kg/(m^2).   She has a 3-4 times enlarged right lobe of the thyroid and about a 3 times enlarged left lobe, forearm, relatively smooth and somewhat irregular.  No pedal edema  ASSESSMENT/ PLAN::   Diabetes type 2   Blood glucose control appears excellent with mostly good readings at home Has not done many postprandial readings and will check her A1c today. Overall has done very well with losing weight progressively with taking Victoza which she is tolerating. Also is more motivated to watch her diet and exercise now  HYPERTENSION: Her blood pressure is excellent and is requiring fewer medications for control. Since her blood pressure is low normal even with not taking her medication for week will stop her HCTZ and continue Diovan low dose only Will continue to monitor her urine microalbumin periodically  GOITER: She has a large goiter which appears to be stable in size. Will check her thyroid functions again  Hyperlipidemia: She is on pravastatin. She will need to get a refill of her prescription and a prescription sent in. Discussed low saturated fat diet Will review her previous records and check lipids periodically  Zebulun Deman 12/28/2012, 3:28 PM   Office Visit on 12/28/2012  Component Date Value Range Status  . Hemoglobin A1C 12/28/2012 7.0* 4.6 - 6.5 % Final   Glycemic Control Guidelines for People with Diabetes:Non Diabetic:  <6%Goal of Therapy: <7%Additional Action Suggested:  >8%   . Sodium 12/28/2012 137  135 - 145 mEq/L Final  . Potassium 12/28/2012 4.7  3.5 - 5.1 mEq/L Final  . Chloride 12/28/2012 102  96 - 112 mEq/L Final  . CO2 12/28/2012 28  19 - 32 mEq/L Final  . Glucose, Bld 12/28/2012 86  70 - 99 mg/dL Final  . BUN 16/12/9602 10  6 - 23 mg/dL Final  .  Creatinine, Ser 12/28/2012 0.8  0.4 - 1.2 mg/dL Final  . Total Bilirubin 12/28/2012 0.4  0.3 - 1.2 mg/dL Final  . Alkaline Phosphatase 12/28/2012 47  39 - 117 U/L Final  . AST 12/28/2012 14  0 - 37 U/L Final  . ALT 12/28/2012 8  0 - 35 U/L Final  . Total Protein 12/28/2012 7.4  6.0 - 8.3 g/dL Final  . Albumin 54/11/8117 3.9  3.5 - 5.2 g/dL Final  . Calcium 78/29/5621 9.4  8.4 - 10.5 mg/dL Final  . GFR 30/86/5784 106.13  >60.00 mL/min Final

## 2012-12-29 ENCOUNTER — Other Ambulatory Visit (INDEPENDENT_AMBULATORY_CARE_PROVIDER_SITE_OTHER): Payer: BC Managed Care – PPO

## 2012-12-29 DIAGNOSIS — E042 Nontoxic multinodular goiter: Secondary | ICD-10-CM

## 2012-12-30 LAB — TSH: TSH: 0.66 u[IU]/mL (ref 0.35–5.50)

## 2012-12-30 LAB — T4, FREE: Free T4: 0.93 ng/dL (ref 0.60–1.60)

## 2012-12-30 NOTE — Progress Notes (Signed)
Quick Note:  Please let patient know that the lab result is normal and no further action needed ______ 

## 2013-03-10 ENCOUNTER — Other Ambulatory Visit: Payer: Self-pay | Admitting: *Deleted

## 2013-03-10 MED ORDER — LIRAGLUTIDE 18 MG/3ML ~~LOC~~ SOPN: 1.2000 mg | PEN_INJECTOR | Freq: Every day | SUBCUTANEOUS | Status: DC

## 2013-04-14 ENCOUNTER — Ambulatory Visit: Payer: BC Managed Care – PPO | Admitting: Physician Assistant

## 2013-05-01 ENCOUNTER — Encounter: Payer: Self-pay | Admitting: Physician Assistant

## 2013-05-01 ENCOUNTER — Ambulatory Visit (INDEPENDENT_AMBULATORY_CARE_PROVIDER_SITE_OTHER): Payer: BC Managed Care – PPO | Admitting: Physician Assistant

## 2013-05-01 ENCOUNTER — Ambulatory Visit: Payer: BC Managed Care – PPO | Admitting: Endocrinology

## 2013-05-01 ENCOUNTER — Encounter: Payer: Self-pay | Admitting: *Deleted

## 2013-05-01 VITALS — BP 126/78 | HR 109 | Temp 98.1°F | Resp 20 | Ht 63.0 in | Wt 168.1 lb

## 2013-05-01 DIAGNOSIS — Z0000 Encounter for general adult medical examination without abnormal findings: Secondary | ICD-10-CM

## 2013-05-01 DIAGNOSIS — E039 Hypothyroidism, unspecified: Secondary | ICD-10-CM

## 2013-05-01 DIAGNOSIS — F3289 Other specified depressive episodes: Secondary | ICD-10-CM

## 2013-05-01 DIAGNOSIS — E119 Type 2 diabetes mellitus without complications: Secondary | ICD-10-CM

## 2013-05-01 DIAGNOSIS — I10 Essential (primary) hypertension: Secondary | ICD-10-CM

## 2013-05-01 DIAGNOSIS — F32A Depression, unspecified: Secondary | ICD-10-CM

## 2013-05-01 DIAGNOSIS — F329 Major depressive disorder, single episode, unspecified: Secondary | ICD-10-CM

## 2013-05-01 DIAGNOSIS — E785 Hyperlipidemia, unspecified: Secondary | ICD-10-CM

## 2013-05-01 DIAGNOSIS — Z8669 Personal history of other diseases of the nervous system and sense organs: Secondary | ICD-10-CM

## 2013-05-01 DIAGNOSIS — N390 Urinary tract infection, site not specified: Secondary | ICD-10-CM

## 2013-05-01 NOTE — Progress Notes (Signed)
Pre-visit discussion using our clinic review tool. No additional management support is needed unless otherwise documented below in the visit note.  

## 2013-05-01 NOTE — Progress Notes (Signed)
Patient ID: [name] is a 48 y.o. female DOB: [phone] MRN: [id number]     HPI: Patient is a 48 year old female who presents to the clinic to establish care. Reports history of DM well controlled with current meds. Glucose readings at home average 115-117 fasting. Hypothyroid not currently on medications. Follow with endocrinologist, Dr. Dwyane Dee for both previous issues, sees him this Friday. Reports history of depression not managed with medications, states she use to see psychologist and would like referral to one now. States no thoughts of hurting self or others. History of HTN and high cholesterol , cholesterol well managed by statin. Migraines, controlled with Excedrin Migraine. Reports history of waking up with bilateral numb/tingling feeling to hands, resolves within minutes of waking. Denies chest pain/palpitations, visual change/disturbance, change in bowel/bladder habits, fever/chills or other concerns at this time.   Influenza:2014 Pneumonia: Tetanus:2014 PAP:05/2012 nl LMP:1993 Mammogram:11/2012    ROS: As stated in HPI. All other systems negative  Past Medical History  Diagnosis Date  . Hypertension   . Diabetes mellitus without complication   . Hyperthyroidism   . Sleep apnea   . Headache(784.0)     migraine  . Hyperlipidemia   . Depression   . Chicken pox     CHILDHOOD   Family History  Problem Relation Age of Onset  . Arthritis Mother   . Arthritis Father   . Hyperlipidemia Father   . Diabetes Father   . Hypertension Sister   . Mental illness Sister   . Diabetes Sister   . Diabetes Brother   . Cancer Paternal Aunt     BREAST   History   Social History  . Marital Status: Divorced    Spouse Name: N/A    Number of Children: N/A  . Years of Education: N/A   Social History Main Topics  . Smoking status: Never Smoker   . Smokeless tobacco: None  . Alcohol Use: No  . Drug Use: No  . Sexual Activity: None   Other Topics Concern  . None   Social  History Narrative  . None   Past Surgical History  Procedure Laterality Date  . Abdominal hysterectomy    . Cholecystectomy    . Robotic assisted laparoscopic lysis of adhesion N/A 06/02/2012    Procedure: ROBOTIC ASSISTED LAPAROSCOPIC LYSIS OF ADHESION;  Surgeon: Marvene Staff, MD;  Location: [facility];  Service: Gynecology;  Laterality: N/A;  . Robotic assisted salpingo oopherectomy Left 06/02/2012    Procedure: ROBOTIC ASSISTED SALPINGO OOPHORECTOMY;  Surgeon: Marvene Staff, MD;  Location: [facility];  Service: Gynecology;  Laterality: Left;  pelvic washings  . Robot assisted myomectomy Left 06/02/2012    Procedure: ROBOTIC ASSISTED MYOMECTOMY;  Surgeon: Marvene Staff, MD;  Location: [facility];  Service: Gynecology;  Laterality: Left;  anexal mass is a myoma left side     PE: CONSTITUTIONAL: Well developed, well nourished, pleasant, appears stated age, in NAD HEENT: normocephalic, atraumatic, bilateral ext/int canals normal. Bilateral TM's without injections, bulging, erythema. Nose normal, uvula midline, oropharynx clear and moist. EYES: PERRLA, bilateral EOM and conjunctiva normal NECK: FROM, supple, without thyromegaly or mass CARDIO: RRR, normal S1 and S2, distal pulses intact. PULM/CHEST CTA bilateral, no wheezes, rales or rhonchi. Non tender. ABD: appearance normal, soft, nontender. Normal bowel sounds x 4 quadrants GU: deferred. MUSC: FROM U/LE bilateral. Diabetic foot exam completed. LYMPH: no cervical, supraclavicular adenopathy NEURO: alert and oriented x 3, no cranial nerve deficit, motor strength and coordination  NL. Negative romberg. Gait normal. SKIN: warm, dry, no rash or lesions noted. PSYCH: Mood and affect normal, speech normal.     ASSESSMENT and PLAN   CPX/v70.0 - Patient has been counseled on age-appropriate routine health concerns for screening and prevention. These are reviewed and up-to-date. Immunizations are up-to-date or declined. Labs ordered  and will be reviewed.  Depression: Patient referred to Psychology per patient request.  Patient instructed to continue with medications as prescribed. Patient requested refill of statin, informed her records show 5 refill left on current prescription.

## 2013-05-01 NOTE — Patient Instructions (Signed)
It was great meeting you today [name]!  Labs have been ordered for you, when you report to lab please be fasting.    Depression, Adult Depression is feeling sad, low, down in the dumps, blue, gloomy, or empty. In general, there are two kinds of depression:  Normal sadness or grief. This can happen after something upsetting. It often goes away on its own within 2 weeks. After losing a loved one (bereavement), normal sadness and grief may last longer than two weeks. It usually gets better with time.  Clinical depression. This kind lasts longer than normal sadness or grief. It keeps you from doing the things you normally do in life. It is often hard to function at home, work, or at school. It may affect your relationships with others. Treatment is often needed. GET HELP RIGHT AWAY IF:  You have thoughts about hurting yourself or others.  You lose touch with reality (psychotic symptoms). You may:  See or hear things that are not real.  Have untrue beliefs about your life or people around you.  Your medicine is giving you problems. MAKE SURE YOU:  Understand these instructions.  Will watch your condition.  Will get help right away if you are not doing well or get worse. Document Released: 04/18/2010 Document Revised: 12/09/2011 Document Reviewed: 07/16/2011 [facility] Patient Information 2014 [place], [place].    Health Maintenance, Female A healthy lifestyle and preventative care can promote health and wellness.  Maintain regular health, dental, and eye exams.  Eat a healthy diet. Foods like vegetables, fruits, whole grains, low-fat dairy products, and lean protein foods contain the nutrients you need without too many calories. Decrease your intake of foods high in solid fats, added sugars, and salt. Get information about a proper diet from your caregiver, if necessary.  Regular physical exercise is one of the most important things you can do for your health. Most adults should  get at least 150 minutes of moderate-intensity exercise (any activity that increases your heart rate and causes you to sweat) each week. In addition, most adults need muscle-strengthening exercises on 2 or more days a week.   Maintain a healthy weight. The body mass index (BMI) is a screening tool to identify possible weight problems. It provides an estimate of body fat based on height and weight. Your caregiver can help determine your BMI, and can help you achieve or maintain a healthy weight. For adults 20 years and older:  A BMI below 18.5 is considered underweight.  A BMI of 18.5 to 24.9 is normal.  A BMI of 25 to 29.9 is considered overweight.  A BMI of 30 and above is considered obese.  Maintain normal blood lipids and cholesterol by exercising and minimizing your intake of saturated fat. Eat a balanced diet with plenty of fruits and vegetables. Blood tests for lipids and cholesterol should begin at age 9 and be repeated every 5 years. If your lipid or cholesterol levels are high, you are over 50, or you are a high risk for heart disease, you may need your cholesterol levels checked more frequently.Ongoing high lipid and cholesterol levels should be treated with medicines if diet and exercise are not effective.  If you smoke, find out from your caregiver how to quit. If you do not use tobacco, do not start.  Lung cancer screening is recommended for adults aged 71 80 years who are at high risk for developing lung cancer because of a history of smoking. Yearly low-dose computed tomography (CT)  is recommended for people who have at least a 30-pack-year history of smoking and are a current smoker or have quit within the past 15 years. A pack year of smoking is smoking an average of 1 pack of cigarettes a day for 1 year (for example: 1 pack a day for 30 years or 2 packs a day for 15 years). Yearly screening should continue until the smoker has stopped smoking for at least 15 years. Yearly  screening should also be stopped for people who develop a health problem that would prevent them from having lung cancer treatment.  If you are pregnant, do not drink alcohol. If you are breastfeeding, be very cautious about drinking alcohol. If you are not pregnant and choose to drink alcohol, do not exceed 1 drink per day. One drink is considered to be 12 ounces (355 mL) of beer, 5 ounces (148 mL) of wine, or 1.5 ounces (44 mL) of liquor.  Avoid use of street drugs. Do not share needles with anyone. Ask for help if you need support or instructions about stopping the use of drugs.  High blood pressure causes heart disease and increases the risk of stroke. Blood pressure should be checked at least every 1 to 2 years. Ongoing high blood pressure should be treated with medicines, if weight loss and exercise are not effective.  If you are 10 to 49 years old, ask your caregiver if you should take aspirin to prevent strokes.  Diabetes screening involves taking a blood sample to check your fasting blood sugar level. This should be done once every 3 years, after age 58, if you are within normal weight and without risk factors for diabetes. Testing should be considered at a younger age or be carried out more frequently if you are overweight and have at least 1 risk factor for diabetes.  Breast cancer screening is essential preventative care for women. You should practice "breast self-awareness." This means understanding the normal appearance and feel of your breasts and may include breast self-examination. Any changes detected, no matter how small, should be reported to a caregiver. Women in their 58s and 30s should have a clinical breast exam (CBE) by a caregiver as part of a regular health exam every 1 to 3 years. After age 88, women should have a CBE every year. Starting at age 9, women should consider having a mammogram (breast X-ray) every year. Women who have a family history of breast cancer should talk  to their caregiver about genetic screening. Women at a high risk of breast cancer should talk to their caregiver about having an MRI and a mammogram every year.  Breast cancer gene (BRCA)-related cancer risk assessment is recommended for women who have family members with BRCA-related cancers. BRCA-related cancers include breast, ovarian, tubal, and peritoneal cancers. Having family members with these cancers may be associated with an increased risk for harmful changes (mutations) in the breast cancer genes BRCA1 and BRCA2. Results of the assessment will determine the need for genetic counseling and BRCA1 and BRCA2 testing.  The Pap test is a screening test for cervical cancer. Women should have a Pap test starting at age 74. Between ages 54 and 77, Pap tests should be repeated every 2 years. Beginning at age 49, you should have a Pap test every 3 years as long as the past 3 Pap tests have been normal. If you had a hysterectomy for a problem that was not cancer or a condition that could lead to cancer, then you  no longer need Pap tests. If you are between ages 63 and 93, and you have had normal Pap tests going back 10 years, you no longer need Pap tests. If you have had past treatment for cervical cancer or a condition that could lead to cancer, you need Pap tests and screening for cancer for at least 20 years after your treatment. If Pap tests have been discontinued, risk factors (such as a new sexual partner) need to be reassessed to determine if screening should be resumed. Some women have medical problems that increase the chance of getting cervical cancer. In these cases, your caregiver may recommend more frequent screening and Pap tests.  The human papillomavirus (HPV) test is an additional test that may be used for cervical cancer screening. The HPV test looks for the virus that can cause the cell changes on the cervix. The cells collected during the Pap test can be tested for HPV. The HPV test could be  used to screen women aged 90 years and older, and should be used in women of any age who have unclear Pap test results. After the age of 60, women should have HPV testing at the same frequency as a Pap test.  Colorectal cancer can be detected and often prevented. Most routine colorectal cancer screening begins at the age of 81 and continues through age 31. However, your caregiver may recommend screening at an earlier age if you have risk factors for colon cancer. On a yearly basis, your caregiver may provide home test kits to check for hidden blood in the stool. Use of a small camera at the end of a tube, to directly examine the colon (sigmoidoscopy or colonoscopy), can detect the earliest forms of colorectal cancer. Talk to your caregiver about this at age 78, when routine screening begins. Direct examination of the colon should be repeated every 5 to 10 years through age 69, unless early forms of pre-cancerous polyps or small growths are found.  Hepatitis C blood testing is recommended for all people born from [birth year] through 1966 and any individual with known risks for hepatitis C.  Practice safe sex. Use condoms and avoid high-risk sexual practices to reduce the spread of sexually transmitted infections (STIs). Sexually active women aged 67 and younger should be checked for Chlamydia, which is a common sexually transmitted infection. Older women with new or multiple partners should also be tested for Chlamydia. Testing for other STIs is recommended if you are sexually active and at increased risk.  Osteoporosis is a disease in which the bones lose minerals and strength with aging. This can result in serious bone fractures. The risk of osteoporosis can be identified using a bone density scan. Women ages 13 and over and women at risk for fractures or osteoporosis should discuss screening with their caregivers. Ask your caregiver whether you should be taking a calcium supplement or vitamin D to reduce the rate  of osteoporosis.  Menopause can be associated with physical symptoms and risks. Hormone replacement therapy is available to decrease symptoms and risks. You should talk to your caregiver about whether hormone replacement therapy is right for you.  Use sunscreen. Apply sunscreen liberally and repeatedly throughout the day. You should seek shade when your shadow is shorter than you. Protect yourself by wearing long sleeves, pants, a wide-brimmed hat, and sunglasses year round, whenever you are outdoors.  Notify your caregiver of new moles or changes in moles, especially if there is a change in shape or color. Also notify  your caregiver if a mole is larger than the size of a pencil eraser.  Stay current with your immunizations. Document Released: 09/29/2010 Document Revised: 07/11/2012 Document Reviewed: 09/29/2010 [facility] Patient Information 2014 [place].

## 2013-05-02 ENCOUNTER — Other Ambulatory Visit (INDEPENDENT_AMBULATORY_CARE_PROVIDER_SITE_OTHER): Payer: BC Managed Care – PPO

## 2013-05-02 DIAGNOSIS — I10 Essential (primary) hypertension: Secondary | ICD-10-CM | POA: Insufficient documentation

## 2013-05-02 DIAGNOSIS — F329 Major depressive disorder, single episode, unspecified: Secondary | ICD-10-CM

## 2013-05-02 DIAGNOSIS — F3289 Other specified depressive episodes: Secondary | ICD-10-CM

## 2013-05-02 DIAGNOSIS — E039 Hypothyroidism, unspecified: Secondary | ICD-10-CM

## 2013-05-02 DIAGNOSIS — F32A Depression, unspecified: Secondary | ICD-10-CM | POA: Insufficient documentation

## 2013-05-02 DIAGNOSIS — Z0000 Encounter for general adult medical examination without abnormal findings: Secondary | ICD-10-CM

## 2013-05-02 DIAGNOSIS — E785 Hyperlipidemia, unspecified: Secondary | ICD-10-CM

## 2013-05-02 DIAGNOSIS — E7800 Pure hypercholesterolemia, unspecified: Secondary | ICD-10-CM

## 2013-05-02 DIAGNOSIS — E119 Type 2 diabetes mellitus without complications: Secondary | ICD-10-CM

## 2013-05-02 LAB — URINALYSIS, ROUTINE W REFLEX MICROSCOPIC
Bilirubin Urine: NEGATIVE
Hgb urine dipstick: NEGATIVE
Ketones, ur: NEGATIVE
Leukocytes, UA: NEGATIVE
Nitrite: POSITIVE — AB
Specific Gravity, Urine: >=1.03 — AB (ref 1.000–1.030)
Total Protein, Urine: NEGATIVE
Urine Glucose: NEGATIVE
Urobilinogen, UA: 0.2 (ref 0.0–1.0)
pH: 5.5 (ref 5.0–8.0)

## 2013-05-02 LAB — CBC WITH DIFFERENTIAL/PLATELET
Basophils Absolute: 0 10*3/uL (ref 0.0–0.1)
Basophils Relative: 0.4 % (ref 0.0–3.0)
Eosinophils Absolute: 0.1 10*3/uL (ref 0.0–0.7)
Eosinophils Relative: 1.6 % (ref 0.0–5.0)
HCT: 41.6 % (ref 36.0–46.0)
Hemoglobin: 12.7 g/dL (ref 12.0–15.0)
Lymphocytes Relative: 33.1 % (ref 12.0–46.0)
Lymphs Abs: 2.5 10*3/uL (ref 0.7–4.0)
MCHC: 30.5 g/dL (ref 30.0–36.0)
MCV: 75.7 fl — ABNORMAL LOW (ref 78.0–100.0)
Monocytes Absolute: 0.7 10*3/uL (ref 0.1–1.0)
Monocytes Relative: 8.6 % (ref 3.0–12.0)
Neutro Abs: 4.3 10*3/uL (ref 1.4–7.7)
Neutrophils Relative %: 56.3 % (ref 43.0–77.0)
Platelets: 381 10*3/uL (ref 150.0–400.0)
RBC: 5.5 Mil/uL — ABNORMAL HIGH (ref 3.87–5.11)
RDW: 14.2 % (ref 11.5–14.6)
WBC: 7.6 10*3/uL (ref 4.5–10.5)

## 2013-05-02 LAB — MICROALBUMIN / CREATININE URINE RATIO
Creatinine,U: 265.2 mg/dL
Microalb Creat Ratio: 2.4 mg/g (ref 0.0–30.0)
Microalb, Ur: 6.4 mg/dL — ABNORMAL HIGH (ref 0.0–1.9)

## 2013-05-02 LAB — BASIC METABOLIC PANEL
BUN: 19 mg/dL (ref 6–23)
CO2: 28 mEq/L (ref 19–32)
Calcium: 9.2 mg/dL (ref 8.4–10.5)
Chloride: 101 mEq/L (ref 96–112)
Creatinine, Ser: 0.9 mg/dL (ref 0.4–1.2)
GFR: 86.98 mL/min (ref >60.00)
Glucose, Bld: 104 mg/dL — ABNORMAL HIGH (ref 70–99)
Potassium: 4.1 mEq/L (ref 3.5–5.1)
Sodium: 138 mEq/L (ref 135–145)

## 2013-05-02 LAB — HEPATIC FUNCTION PANEL
ALT: 13 U/L (ref 0–35)
AST: 17 U/L (ref 0–37)
Albumin: 4 g/dL (ref 3.5–5.2)
Alkaline Phosphatase: 53 U/L (ref 39–117)
Bilirubin, Direct: 0 mg/dL (ref 0.0–0.3)
Total Bilirubin: 0.7 mg/dL (ref 0.3–1.2)
Total Protein: 7.9 g/dL (ref 6.0–8.3)

## 2013-05-02 LAB — LIPID PANEL
Cholesterol: 195 mg/dL (ref 0–200)
HDL: 47.7 mg/dL (ref >39.00)
LDL Cholesterol: 118 mg/dL — ABNORMAL HIGH (ref 0–99)
Total CHOL/HDL Ratio: 4
Triglycerides: 146 mg/dL (ref 0.0–149.0)
VLDL: 29.2 mg/dL (ref 0.0–40.0)

## 2013-05-02 LAB — TSH: TSH: 0.68 u[IU]/mL (ref 0.35–5.50)

## 2013-05-02 LAB — HEMOGLOBIN A1C: Hgb A1c MFr Bld: 7.4 % — ABNORMAL HIGH (ref 4.6–6.5)

## 2013-05-02 MED ORDER — SULFAMETHOXAZOLE-TMP DS 800-160 MG PO TABS: 1.0000 | ORAL_TABLET | Freq: Two times a day (BID) | ORAL | Status: DC

## 2013-05-02 MED ORDER — LISINOPRIL 10 MG PO TABS: 10.0000 mg | ORAL_TABLET | Freq: Every day | ORAL | Status: DC

## 2013-05-02 NOTE — Addendum Note (Signed)
Addended by: Kyra Leyland on: 05/02/2013 08:45 PM   Modules accepted: Orders

## 2013-05-02 NOTE — Addendum Note (Signed)
Addended by: Kyra Leyland on: 05/02/2013 08:51 PM   Modules accepted: Orders

## 2013-05-04 ENCOUNTER — Other Ambulatory Visit: Payer: Self-pay | Admitting: *Deleted

## 2013-05-04 MED ORDER — METFORMIN HCL 1000 MG PO TABS: 1000.0000 mg | ORAL_TABLET | Freq: Two times a day (BID) | ORAL | Status: DC

## 2013-05-05 ENCOUNTER — Ambulatory Visit: Payer: BC Managed Care – PPO | Admitting: Endocrinology

## 2013-05-09 ENCOUNTER — Encounter: Payer: Self-pay | Admitting: Endocrinology

## 2013-05-09 ENCOUNTER — Other Ambulatory Visit: Payer: Self-pay | Admitting: *Deleted

## 2013-05-09 ENCOUNTER — Ambulatory Visit (INDEPENDENT_AMBULATORY_CARE_PROVIDER_SITE_OTHER): Payer: BC Managed Care – PPO | Admitting: Endocrinology

## 2013-05-09 ENCOUNTER — Encounter: Payer: Self-pay | Admitting: *Deleted

## 2013-05-09 VITALS — BP 114/62 | HR 106 | Temp 97.9°F | Resp 16 | Ht 63.0 in | Wt 168.2 lb

## 2013-05-09 DIAGNOSIS — E119 Type 2 diabetes mellitus without complications: Secondary | ICD-10-CM

## 2013-05-09 DIAGNOSIS — E785 Hyperlipidemia, unspecified: Secondary | ICD-10-CM

## 2013-05-09 DIAGNOSIS — I10 Essential (primary) hypertension: Secondary | ICD-10-CM

## 2013-05-09 DIAGNOSIS — E042 Nontoxic multinodular goiter: Secondary | ICD-10-CM

## 2013-05-09 MED ORDER — METFORMIN HCL ER 500 MG PO TB24: 2000.0000 mg | ORAL_TABLET | Freq: Every day | ORAL | Status: DC

## 2013-05-09 MED ORDER — GLUCOSE BLOOD VI STRP: ORAL_STRIP | Status: DC

## 2013-05-09 NOTE — Progress Notes (Signed)
Patient ID: [name], female   DOB: 1965-08-18, 48 y.o.   MRN: [id number]   Reason for Appointment: Diabetes follow-up   History of Present Illness   Diagnosis: Type 2 DIABETES MELITUS  Previous history: Her previous records are not available at present  Recent history: Her blood sugars appear to be very well controlled with using 1.2 mg Victoza along with metformin. She also has done very well with losing weight over the last few months     Oral hypoglycemic drugs: Metformin Side effects from medications: ? GI from Metformin       Monitors blood glucose: Once a day.    Glucometer: One Touch.          Blood Glucose readings from meter download: readings before breakfast: 97-114, nonfasting 96-150 with highest reading after breakfast Overall median 110  Hypoglycemia frequency:  none        Meals: 3 meals per day. Largest meal 6:30 am; occasionally goes off diet         Physical activity: exercise: is relatively active at work but is not doing any programmed exercise             Wt Readings from Last 3 Encounters:  05/09/13 168 lb 3.2 oz (76.295 kg)  05/01/13 168 lb 1.9 oz (76.259 kg)  12/28/12 135 lb 12.8 oz (61.598 kg)   Lab Results  Component Value Date   HGBA1C 7.4* 05/02/2013   HGBA1C 7.0* 12/28/2012   Lab Results  Component Value Date   MICROALBUR 6.4* 05/02/2013   LDLCALC 118* 05/02/2013   CREATININE 0.9 05/02/2013       Medication List       This list is accurate as of: 05/09/13  3:06 PM.  Always use your most recent med list.               aspirin-acetaminophen-caffeine 250-250-65 MG per tablet  Commonly known as:  EXCEDRIN MIGRAINE  Take 1 tablet by mouth every 6 (six) hours as needed for pain (headache).     glucose blood test strip  Commonly known as:  ONETOUCH VERIO  Use as instructed to check blood sugars 2 times per day dx code 250.00     Liraglutide 18 MG/3ML Sopn  Inject 1.2 mg into the skin daily.     lisinopril 10 MG tablet  Commonly known as:   PRINIVIL,ZESTRIL  Take 1 tablet (10 mg total) by mouth daily.     metFORMIN 1000 MG tablet  Commonly known as:  GLUCOPHAGE  Take 1 tablet (1,000 mg total) by mouth 2 (two) times daily with a meal.     multivitamin with minerals Tabs tablet  Take 1 tablet by mouth daily.     pravastatin 40 MG tablet  Commonly known as:  PRAVACHOL  Take 1 tablet (40 mg total) by mouth daily.     sulfamethoxazole-trimethoprim 800-160 MG per tablet  Commonly known as:  BACTRIM DS  Take 1 tablet by mouth 2 (two) times daily.        Allergies: No Known Allergies  Past Medical History  Diagnosis Date  . Hypertension   . Diabetes mellitus without complication   . Hyperthyroidism   . Sleep apnea   . Headache(784.0)     migraine  . Hyperlipidemia   . Depression   . Chicken pox     CHILDHOOD    Past Surgical History  Procedure Laterality Date  . Abdominal hysterectomy    . Cholecystectomy    .  Robotic assisted laparoscopic lysis of adhesion N/A 06/02/2012    Procedure: ROBOTIC ASSISTED LAPAROSCOPIC LYSIS OF ADHESION;  Surgeon: Marvene Staff, MD;  Location: [facility];  Service: Gynecology;  Laterality: N/A;  . Robotic assisted salpingo oopherectomy Left 06/02/2012    Procedure: ROBOTIC ASSISTED SALPINGO OOPHORECTOMY;  Surgeon: Marvene Staff, MD;  Location: [facility];  Service: Gynecology;  Laterality: Left;  pelvic washings  . Robot assisted myomectomy Left 06/02/2012    Procedure: ROBOTIC ASSISTED MYOMECTOMY;  Surgeon: Marvene Staff, MD;  Location: [facility];  Service: Gynecology;  Laterality: Left;  anexal mass is a myoma left side    Family History  Problem Relation Age of Onset  . Arthritis Mother   . Arthritis Father   . Hyperlipidemia Father   . Diabetes Father   . Hypertension Sister   . Mental illness Sister   . Diabetes Sister   . Diabetes Brother   . Cancer Paternal Aunt     BREAST    Social History:  reports that she has never smoked. She does not have any  smokeless tobacco history on file. She reports that she does not drink alcohol or use illicit drugs.  Review of Systems:  She is complaining of nausea in the last week and not feeling well overall. Apparently has been given Bactrim by PCP because of his symptomatic UTI  Hypertension:  she is on lisinopril alone though for hypertension with good control  Lipids: Has been on pravastatin for quite some time with only fair control of lipids, has been prescribed by PCP    She has a long-standing history of euthyroid multinodular goiter. No local discomfort, pressure sensation or difficulty swallowing  Lab Results  Component Value Date   TSH 0.68 05/02/2013      Examination:   BP 114/62  Pulse 106  Temp(Src) 97.9 F (36.6 C)  Resp 16  Ht 5\' 3"  (1.6 m)  Wt 168 lb 3.2 oz (76.295 kg)  BMI 29.80 kg/m2  SpO2 97%  Body mass index is 29.8 kg/(m^2).   No pedal edema  ASSESSMENT/ PLAN::   Diabetes type 2   Blood glucose control appears good with near-normal readings at home However A1c is higher than expected and may be related to higher postprandial readings after supper She needs to start monitoring readings after her evening meal also She generally  motivated to watch her diet and exercise now but may not be consistent on diet  HYPERTENSION: Her blood pressure is excellent and is requiring fewer medications for control. Will continue to monitor her urine microalbumin periodically  Hyperlipidemia: She is on pravastatin. She will need to discuss adjusting her medication with PCP since LDL is over 100 Discussed low saturated fat diet  Nausea: May be related to her taking Septra. She can stop this for now as she has had only a symptomatic UTI  [facility] 05/09/2013, 3:06 PM   No visits with results within 1 Week(s) from this visit. Latest known visit with results is:  Appointment on 05/02/2013  Component Date Value Range Status  . TSH 05/02/2013 0.68  0.35 - 5.50 uIU/mL Final   . Total Bilirubin 05/02/2013 0.7  0.3 - 1.2 mg/dL Final  . Bilirubin, Direct 05/02/2013 0.0  0.0 - 0.3 mg/dL Final  . Alkaline Phosphatase 05/02/2013 53  39 - 117 U/L Final  . AST 05/02/2013 17  0 - 37 U/L Final  . ALT 05/02/2013 13  0 - 35 U/L Final  . Total Protein  05/02/2013 7.9  6.0 - 8.3 g/dL Final  . Albumin 05/02/2013 4.0  3.5 - 5.2 g/dL Final  . Sodium 05/02/2013 138  135 - 145 mEq/L Final  . Potassium 05/02/2013 4.1  3.5 - 5.1 mEq/L Final  . Chloride 05/02/2013 101  96 - 112 mEq/L Final  . CO2 05/02/2013 28  19 - 32 mEq/L Final  . Glucose, Bld 05/02/2013 104* 70 - 99 mg/dL Final  . BUN 05/02/2013 19  6 - 23 mg/dL Final  . Creatinine, Ser 05/02/2013 0.9  0.4 - 1.2 mg/dL Final  . Calcium 05/02/2013 9.2  8.4 - 10.5 mg/dL Final  . GFR 05/02/2013 86.98  >60.00 mL/min Final  . Hemoglobin A1C 05/02/2013 7.4* 4.6 - 6.5 % Final   Glycemic Control Guidelines for People with Diabetes:Non Diabetic:  <6%Goal of Therapy: <7%Additional Action Suggested:  >8%   . Microalb, Ur 05/02/2013 6.4* 0.0 - 1.9 mg/dL Final  . Creatinine,U 05/02/2013 265.2   Final  . Microalb Creat Ratio 05/02/2013 2.4  0.0 - 30.0 mg/g Final  . WBC 05/02/2013 7.6  4.5 - 10.5 K/uL Final  . RBC 05/02/2013 5.50* 3.87 - 5.11 Mil/uL Final  . Hemoglobin 05/02/2013 12.7  12.0 - 15.0 g/dL Final  . HCT 05/02/2013 41.6  36.0 - 46.0 % Final  . MCV 05/02/2013 75.7* 78.0 - 100.0 fl Final  . MCHC 05/02/2013 30.5  30.0 - 36.0 g/dL Final  . RDW 05/02/2013 14.2  11.5 - 14.6 % Final  . Platelets 05/02/2013 381.0  150.0 - 400.0 K/uL Final  . Neutrophils Relative % 05/02/2013 56.3  43.0 - 77.0 % Final  . Lymphocytes Relative 05/02/2013 33.1  12.0 - 46.0 % Final  . Monocytes Relative 05/02/2013 8.6  3.0 - 12.0 % Final  . Eosinophils Relative 05/02/2013 1.6  0.0 - 5.0 % Final  . Basophils Relative 05/02/2013 0.4  0.0 - 3.0 % Final  . Neutro Abs 05/02/2013 4.3  1.4 - 7.7 K/uL Final  . Lymphs Abs 05/02/2013 2.5  0.7 - 4.0 K/uL Final  .  Monocytes Absolute 05/02/2013 0.7  0.1 - 1.0 K/uL Final  . Eosinophils Absolute 05/02/2013 0.1  0.0 - 0.7 K/uL Final  . Basophils Absolute 05/02/2013 0.0  0.0 - 0.1 K/uL Final  . Color, Urine 05/02/2013 YELLOW  Yellow;Lt. Yellow Final  . APPearance 05/02/2013 CLEAR  Clear Final  . Specific Gravity, Urine 05/02/2013 >=1.030* 1.000 - 1.030 Final  . pH 05/02/2013 5.5  5.0 - 8.0 Final  . Total Protein, Urine 05/02/2013 NEGATIVE  Negative Final  . Urine Glucose 05/02/2013 NEGATIVE  Negative Final  . Ketones, ur 05/02/2013 NEGATIVE  Negative Final  . Bilirubin Urine 05/02/2013 NEGATIVE  Negative Final  . Hgb urine dipstick 05/02/2013 NEGATIVE  Negative Final  . Urobilinogen, UA 05/02/2013 0.2  0.0 - 1.0 Final  . Leukocytes, UA 05/02/2013 NEGATIVE  Negative Final   Rechecked and verified result.  . Nitrite 05/02/2013 POSITIVE* Negative Final  . WBC, UA 05/02/2013 7-10/hpf* 0-2/hpf Final   Results faxed to site/floor on 05/02/2013 11:27 AM by Delorise Jackson.  . RBC / HPF 05/02/2013 0-2/hpf  0-2/hpf Final  . Mucus, UA 05/02/2013 Presence of* None Final  . Squamous Epithelial / LPF 05/02/2013 Rare(0-4/hpf)  Rare(0-4/hpf) Final  . Bacteria, UA 05/02/2013 Many(>50/hpf)* None Final  . Cholesterol 05/02/2013 195  0 - 200 mg/dL Final   ATP III Classification       Desirable:  < 200 mg/dL  Borderline High:  200 - 239 mg/dL          High:  > = 240 mg/dL  . Triglycerides 05/02/2013 146.0  0.0 - 149.0 mg/dL Final   Normal:  <150 mg/dLBorderline High:  150 - 199 mg/dL  . HDL 05/02/2013 47.70  >39.00 mg/dL Final  . VLDL 05/02/2013 29.2  0.0 - 40.0 mg/dL Final  . LDL Cholesterol 05/02/2013 118* 0 - 99 mg/dL Final  . Total CHOL/HDL Ratio 05/02/2013 4   Final                  Men          Women1/2 Average Risk     3.4          3.3Average Risk          5.0          4.42X Average Risk          9.6          7.13X Average Risk          15.0          11.0

## 2013-05-15 ENCOUNTER — Ambulatory Visit: Payer: BC Managed Care – PPO | Admitting: Licensed Clinical Social Worker

## 2013-05-22 ENCOUNTER — Ambulatory Visit (INDEPENDENT_AMBULATORY_CARE_PROVIDER_SITE_OTHER): Payer: BC Managed Care – PPO | Admitting: Licensed Clinical Social Worker

## 2013-05-22 DIAGNOSIS — F331 Major depressive disorder, recurrent, moderate: Secondary | ICD-10-CM

## 2013-06-05 ENCOUNTER — Ambulatory Visit: Payer: BC Managed Care – PPO | Admitting: Licensed Clinical Social Worker

## 2013-06-07 ENCOUNTER — Other Ambulatory Visit: Payer: Self-pay | Admitting: *Deleted

## 2013-06-12 ENCOUNTER — Telehealth: Payer: Self-pay | Admitting: Endocrinology

## 2013-06-12 ENCOUNTER — Ambulatory Visit (INDEPENDENT_AMBULATORY_CARE_PROVIDER_SITE_OTHER): Payer: BC Managed Care – PPO | Admitting: Licensed Clinical Social Worker

## 2013-06-12 DIAGNOSIS — F321 Major depressive disorder, single episode, moderate: Secondary | ICD-10-CM

## 2013-06-12 NOTE — Telephone Encounter (Signed)
Pt states she went to pick up her Rx and both have been denied  Please advise pt   Call back: [phone]  Thank You

## 2013-06-13 ENCOUNTER — Other Ambulatory Visit: Payer: Self-pay | Admitting: *Deleted

## 2013-06-13 MED ORDER — LIRAGLUTIDE 18 MG/3ML ~~LOC~~ SOPN: 1.2000 mg | PEN_INJECTOR | Freq: Every day | SUBCUTANEOUS | Status: DC

## 2013-06-13 MED ORDER — PRAVASTATIN SODIUM 40 MG PO TABS: 40.0000 mg | ORAL_TABLET | Freq: Every day | ORAL | Status: DC

## 2013-06-13 NOTE — Telephone Encounter (Signed)
rx for pravastatin and victoza has been sent to patients pharmacy.

## 2013-06-15 ENCOUNTER — Encounter: Payer: Self-pay | Admitting: *Deleted

## 2013-06-15 ENCOUNTER — Encounter: Payer: Self-pay | Admitting: Physician Assistant

## 2013-06-15 ENCOUNTER — Ambulatory Visit (INDEPENDENT_AMBULATORY_CARE_PROVIDER_SITE_OTHER): Payer: BC Managed Care – PPO | Admitting: Physician Assistant

## 2013-06-15 VITALS — BP 142/92 | HR 82 | Temp 98.3°F | Wt 172.4 lb

## 2013-06-15 DIAGNOSIS — I10 Essential (primary) hypertension: Secondary | ICD-10-CM

## 2013-06-15 DIAGNOSIS — G43909 Migraine, unspecified, not intractable, without status migrainosus: Secondary | ICD-10-CM

## 2013-06-15 MED ORDER — LISINOPRIL 10 MG PO TABS: 10.0000 mg | ORAL_TABLET | Freq: Every day | ORAL | Status: DC

## 2013-06-15 MED ORDER — ZOLMITRIPTAN 5 MG NA SOLN: 1.0000 | NASAL | Status: DC | PRN

## 2013-06-15 NOTE — Progress Notes (Signed)
Pre visit review using our clinic review tool, if applicable. No additional management support is needed unless otherwise documented below in the visit note. 

## 2013-06-15 NOTE — Patient Instructions (Signed)
Good to see you again [name]!  I have sent prescriptions to your pharmacy for the Lisinopril and Zomig. Please take as directed.   Migraine Headache A migraine headache is very bad, throbbing pain on one or both sides of your head. Talk to your doctor about what things may bring on (trigger) your migraine headaches. HOME CARE  Only take medicines as told by your doctor.  Lie down in a dark, quiet room when you have a migraine.  Keep a journal to find out if certain things bring on migraine headaches. For example, write down:  What you eat and drink.  How much sleep you get.  Any change to your diet or medicines.  Lessen how much alcohol you drink.  Quit smoking if you smoke.  Get enough sleep.  Lessen any stress in your life.  Keep lights dim if bright lights bother you or make your migraines worse. GET HELP RIGHT AWAY IF:   Your migraine becomes really bad.  You have a fever.  You have a stiff neck.  You have trouble seeing.  Your muscles are weak, or you lose muscle control.  You lose your balance or have trouble walking.  You feel like you will pass out (faint), or you pass out.  You have really bad symptoms that are different than your first symptoms. MAKE SURE YOU:   Understand these instructions.  Will watch your condition.  Will get help right away if you are not doing well or get worse. Document Released: 12/24/2007 Document Revised: 06/08/2011 Document Reviewed: 11/21/2012 [facility] Patient Information 2014 [place].

## 2013-06-15 NOTE — Progress Notes (Signed)
Subjective:    Patient ID: [name], female    DOB: Feb 13, 1966, 48 y.o.   MRN: [id number]  HPI Comments: Patient is a 48 year old female who presents to the office with complaint of headache. Has history of migraines which normally respond to Excedrin Migraine medication. Reports it is not helping this time. States this headache has been on and off for the last two weeks, alternating between the left and right temples. Describes it as a throbbing, squeezing pain. States normal triggers are sound and light. She has noticed new triggers to include strong smells like cigarette smoke. States has intermittent nausea with it. Has continued to work while having headaches. Patient reports not taking Lisinopril any longer, stating endocrinologist told her to stop taking it. Denies visual change/disturbances, vomiting, lightheadedness, vertigo, eye pain or pressure, numbness, weakness.  Denies sinus or cold symptoms, denies cough.   Review of Systems  Constitutional: Negative for fever, chills and fatigue.  Eyes: Negative for pain and visual disturbance.  Gastrointestinal: Positive for nausea (usually resolves quickly). Negative for vomiting.  Musculoskeletal: Negative for gait problem.  Neurological: Positive for headaches. Negative for dizziness, seizures, facial asymmetry, weakness, light-headedness and numbness.   Past Medical History  Diagnosis Date  . Hypertension   . Diabetes mellitus without complication   . Hyperthyroidism   . Sleep apnea   . Headache(784.0)     migraine  . Hyperlipidemia   . Depression   . Chicken pox     CHILDHOOD      Objective:   Physical Exam  Vitals reviewed. Constitutional: She is oriented to person, place, and time. She appears well-developed and well-nourished. No distress.  HENT:  Head: Normocephalic and atraumatic.  Right Ear: Hearing, tympanic membrane, external ear and ear canal normal. Tympanic membrane is not injected and not erythematous.  Left  Ear: Hearing, tympanic membrane, external ear and ear canal normal. Tympanic membrane is not injected and not erythematous.  Nose: Nose normal. No rhinorrhea. Right sinus exhibits no maxillary sinus tenderness and no frontal sinus tenderness. Left sinus exhibits no maxillary sinus tenderness and no frontal sinus tenderness.  Mouth/Throat: Uvula is midline, oropharynx is clear and moist and mucous membranes are normal. Normal dentition.  Eyes: Conjunctivae and EOM are normal. Pupils are equal, round, and reactive to light. No scleral icterus.  Neck: Normal range of motion.  Cardiovascular: Normal rate and regular rhythm.  Exam reveals no gallop and no friction rub.   No murmur heard. Pulmonary/Chest: Effort normal and breath sounds normal. No respiratory distress. She has no wheezes. She has no rales.  Musculoskeletal: Normal range of motion.  Neurological: She is alert and oriented to person, place, and time. She has normal strength. No cranial nerve deficit or sensory deficit. She displays a negative Romberg sign. Coordination and gait normal. GCS eye subscore is 4. GCS verbal subscore is 5. GCS motor subscore is 6.  Reflex Scores:      Patellar reflexes are 2+ on the right side and 2+ on the left side. Gait normal, able to perform heel/toe gait without difficulty, able to perform heel to shin and rapid alternating hand movements without difficulty.  Skin: Skin is warm and dry. She is not diaphoretic.  Psychiatric: She has a normal mood and affect.    Filed Vitals:   06/15/13 1413  BP: 142/92  Pulse: 82  Temp: 98.3 F (36.8 C)   BP Readings from Last 3 Encounters:  06/15/13 142/92  05/09/13 114/62  05/01/13  126/78       Assessment & Plan:    Migraine Symptoms seem migraine related. No sinus or URI symptoms. Rx for Zomig 5 mg nasal solution Patient provided sample while in office, noted some improvement of symptoms.   HTN: Blood pressure elevated today, patient reports was  elevated other day at home 170/? Patient instructed to start bp medications again.  Refill for Lisinopril today.

## 2013-06-23 ENCOUNTER — Ambulatory Visit (INDEPENDENT_AMBULATORY_CARE_PROVIDER_SITE_OTHER): Payer: BC Managed Care – PPO | Admitting: Licensed Clinical Social Worker

## 2013-06-23 DIAGNOSIS — F331 Major depressive disorder, recurrent, moderate: Secondary | ICD-10-CM

## 2013-07-10 ENCOUNTER — Ambulatory Visit: Payer: BC Managed Care – PPO | Admitting: Licensed Clinical Social Worker

## 2013-07-21 ENCOUNTER — Ambulatory Visit (INDEPENDENT_AMBULATORY_CARE_PROVIDER_SITE_OTHER): Payer: BC Managed Care – PPO | Admitting: Licensed Clinical Social Worker

## 2013-07-21 DIAGNOSIS — F331 Major depressive disorder, recurrent, moderate: Secondary | ICD-10-CM

## 2013-07-31 ENCOUNTER — Ambulatory Visit: Payer: BC Managed Care – PPO | Admitting: Physician Assistant

## 2013-08-07 ENCOUNTER — Other Ambulatory Visit (INDEPENDENT_AMBULATORY_CARE_PROVIDER_SITE_OTHER): Payer: BC Managed Care – PPO

## 2013-08-07 DIAGNOSIS — E119 Type 2 diabetes mellitus without complications: Secondary | ICD-10-CM

## 2013-08-07 DIAGNOSIS — I10 Essential (primary) hypertension: Secondary | ICD-10-CM

## 2013-08-07 LAB — URINALYSIS, ROUTINE W REFLEX MICROSCOPIC
Bilirubin Urine: NEGATIVE
Hgb urine dipstick: NEGATIVE
Ketones, ur: NEGATIVE
Nitrite: POSITIVE — AB
RBC / HPF: NONE SEEN (ref 0–?)
Specific Gravity, Urine: >=1.03 — AB (ref 1.000–1.030)
Total Protein, Urine: NEGATIVE
Urine Glucose: NEGATIVE
Urobilinogen, UA: 0.2 (ref 0.0–1.0)
pH: 5.5 (ref 5.0–8.0)

## 2013-08-07 LAB — BASIC METABOLIC PANEL
BUN: 8 mg/dL (ref 6–23)
CO2: 26 mEq/L (ref 19–32)
Calcium: 9.1 mg/dL (ref 8.4–10.5)
Chloride: 104 mEq/L (ref 96–112)
Creatinine, Ser: 0.7 mg/dL (ref 0.4–1.2)
GFR: 107.51 mL/min (ref >60.00)
Glucose, Bld: 98 mg/dL (ref 70–99)
Potassium: 3.9 mEq/L (ref 3.5–5.1)
Sodium: 138 mEq/L (ref 135–145)

## 2013-08-07 LAB — LIPID PANEL
Cholesterol: 117 mg/dL (ref 0–200)
HDL: 48.2 mg/dL (ref >39.00)
LDL Cholesterol: 54 mg/dL (ref 0–99)
Total CHOL/HDL Ratio: 2
Triglycerides: 74 mg/dL (ref 0.0–149.0)
VLDL: 14.8 mg/dL (ref 0.0–40.0)

## 2013-08-07 LAB — HEMOGLOBIN A1C: Hgb A1c MFr Bld: 6.7 % — ABNORMAL HIGH (ref 4.6–6.5)

## 2013-08-08 ENCOUNTER — Other Ambulatory Visit: Payer: Self-pay | Admitting: Orthopedic Surgery

## 2013-08-10 ENCOUNTER — Ambulatory Visit: Payer: BC Managed Care – PPO | Admitting: Endocrinology

## 2013-08-11 ENCOUNTER — Encounter (HOSPITAL_BASED_OUTPATIENT_CLINIC_OR_DEPARTMENT_OTHER)
Admission: RE | Admit: 2013-08-11 | Discharge: 2013-08-11 | Disposition: A | Discharge status: Home / Self Care | Payer: BC Managed Care – PPO | Source: Ambulatory Visit | Attending: Orthopedic Surgery | Admitting: Orthopedic Surgery

## 2013-08-11 ENCOUNTER — Other Ambulatory Visit: Payer: Self-pay

## 2013-08-11 ENCOUNTER — Ambulatory Visit (INDEPENDENT_AMBULATORY_CARE_PROVIDER_SITE_OTHER): Payer: BC Managed Care – PPO | Admitting: Licensed Clinical Social Worker

## 2013-08-11 ENCOUNTER — Encounter (HOSPITAL_BASED_OUTPATIENT_CLINIC_OR_DEPARTMENT_OTHER): Payer: Self-pay | Admitting: *Deleted

## 2013-08-11 DIAGNOSIS — Z01810 Encounter for preprocedural cardiovascular examination: Secondary | ICD-10-CM | POA: Insufficient documentation

## 2013-08-11 DIAGNOSIS — F331 Major depressive disorder, recurrent, moderate: Secondary | ICD-10-CM

## 2013-08-11 NOTE — Progress Notes (Signed)
Will come in for ekg-uses cpap-will bring cpap and meds dos-will use post op

## 2013-08-14 NOTE — H&P (Signed)
[name] is an 48 y.o. female.   Chief Complaint: c/o chronic and progressive numbness and tingling of the right hand HPI: [name] returns for follow-up examination of her right arm.  In 2011 she had numbness in her left arm with electrodiagnostic studies confirming significant ulnar entrapment neuropathy.  She subsequently has had some difficulties with shoulder discomfort and adhesive capsulitis.  She does have type II diabetes.  Her left arm responded very well to in situ decompression accomplished May 07, 2009.  She has had no further symptoms on the left.   Past Medical History  Diagnosis Date  . Hypertension   . Diabetes mellitus without complication   . Hyperthyroidism   . Headache(784.0)     migraine  . Hyperlipidemia   . Depression   . Chicken pox     CHILDHOOD  . Sleep apnea     uses a cpap    Past Surgical History  Procedure Laterality Date  . Abdominal hysterectomy    . Cholecystectomy    . Robotic assisted laparoscopic lysis of adhesion N/A 06/02/2012    Procedure: ROBOTIC ASSISTED LAPAROSCOPIC LYSIS OF ADHESION;  Surgeon: Marvene Staff, MD;  Location: [facility];  Service: Gynecology;  Laterality: N/A;  . Robotic assisted salpingo oopherectomy Left 06/02/2012    Procedure: ROBOTIC ASSISTED SALPINGO OOPHORECTOMY;  Surgeon: Marvene Staff, MD;  Location: [facility];  Service: Gynecology;  Laterality: Left;  pelvic washings  . Robot assisted myomectomy Left 06/02/2012    Procedure: ROBOTIC ASSISTED MYOMECTOMY;  Surgeon: Marvene Staff, MD;  Location: [facility];  Service: Gynecology;  Laterality: Left;  anexal mass is a myoma left side    Family History  Problem Relation Age of Onset  . Arthritis Mother   . Arthritis Father   . Hyperlipidemia Father   . Diabetes Father   . Hypertension Sister   . Mental illness Sister   . Diabetes Sister   . Diabetes Brother   . Cancer Paternal Aunt     BREAST   Social History:  reports that she has never smoked. She  does not have any smokeless tobacco history on file. She reports that she does not drink alcohol or use illicit drugs.  Allergies: No Known Allergies  No prescriptions prior to admission    No results found for this or any previous visit (from the past 48 hour(s)).  No results found.   Pertinent items are noted in HPI.  Height 5\' 3"  (1.6 m), weight 78.019 kg (172 lb).  General appearance: alert Head: Normocephalic, without obvious abnormality Neck: supple, symmetrical, trachea midline Resp: clear to auscultation bilaterally Cardio: regular rate and rhythm GI: normal findings: bowel sounds normal Extremities:She now has numbness at night and during the day time in her ulnar innervated digits and has weakness of pinch and grasp on the right.  She has elbow range of motion 0 to 140 degrees flexion, she has full pronation/supination.  She has 5-/5 pinch strength right compared to left, she has 5- / 5 abduction, adduction of her fingers.  Her Froment and Wartenberg signs are negative.    Pulses: 2+ and symmetric Skin: normal Neurologic: Grossly normal    Assessment/Plan Impression:  Right ulnar nerve compression at cubital tunnel.  Plan: To the OR for in-situ decompression right ulnar nerve with possible transposition.The procedure, risks,benefits and post-op course were discussed with the patient at length and they were in agreement with the plan.  [name] 08/14/2013, 2:36 PM  H&P  documentation: 08/15/2013  -History and Physical Reviewed  -Patient has been re-examined  -No change in the plan of care  Emily Sickle, MD

## 2013-08-15 ENCOUNTER — Ambulatory Visit (HOSPITAL_BASED_OUTPATIENT_CLINIC_OR_DEPARTMENT_OTHER): Payer: BC Managed Care – PPO | Admitting: Anesthesiology

## 2013-08-15 ENCOUNTER — Encounter (HOSPITAL_BASED_OUTPATIENT_CLINIC_OR_DEPARTMENT_OTHER): Payer: Self-pay

## 2013-08-15 ENCOUNTER — Encounter (HOSPITAL_BASED_OUTPATIENT_CLINIC_OR_DEPARTMENT_OTHER): Payer: BC Managed Care – PPO | Admitting: Anesthesiology

## 2013-08-15 ENCOUNTER — Encounter (HOSPITAL_BASED_OUTPATIENT_CLINIC_OR_DEPARTMENT_OTHER)
Admission: RE | Disposition: A | Discharge status: Home / Self Care | Payer: Self-pay | Source: Ambulatory Visit | Attending: Orthopedic Surgery

## 2013-08-15 ENCOUNTER — Ambulatory Visit (HOSPITAL_BASED_OUTPATIENT_CLINIC_OR_DEPARTMENT_OTHER)
Admission: RE | Admit: 2013-08-15 | Discharge: 2013-08-15 | Disposition: A | Discharge status: Home / Self Care | Payer: BC Managed Care – PPO | Source: Ambulatory Visit | Attending: Orthopedic Surgery | Admitting: Orthopedic Surgery

## 2013-08-15 DIAGNOSIS — G4730 Sleep apnea, unspecified: Secondary | ICD-10-CM | POA: Insufficient documentation

## 2013-08-15 DIAGNOSIS — I10 Essential (primary) hypertension: Secondary | ICD-10-CM | POA: Insufficient documentation

## 2013-08-15 DIAGNOSIS — G5620 Lesion of ulnar nerve, unspecified upper limb: Secondary | ICD-10-CM | POA: Insufficient documentation

## 2013-08-15 DIAGNOSIS — E0590 Thyrotoxicosis, unspecified without thyrotoxic crisis or storm: Secondary | ICD-10-CM | POA: Insufficient documentation

## 2013-08-15 DIAGNOSIS — F329 Major depressive disorder, single episode, unspecified: Secondary | ICD-10-CM | POA: Insufficient documentation

## 2013-08-15 DIAGNOSIS — E785 Hyperlipidemia, unspecified: Secondary | ICD-10-CM | POA: Insufficient documentation

## 2013-08-15 DIAGNOSIS — E119 Type 2 diabetes mellitus without complications: Secondary | ICD-10-CM | POA: Insufficient documentation

## 2013-08-15 DIAGNOSIS — F3289 Other specified depressive episodes: Secondary | ICD-10-CM | POA: Insufficient documentation

## 2013-08-15 DIAGNOSIS — D649 Anemia, unspecified: Secondary | ICD-10-CM | POA: Insufficient documentation

## 2013-08-15 HISTORY — PX: ULNAR NERVE TRANSPOSITION: SHX2595

## 2013-08-15 LAB — GLUCOSE, CAPILLARY
Glucose-Capillary: 95 mg/dL (ref 70–99)
Glucose-Capillary: 79 mg/dL (ref 70–99)

## 2013-08-15 LAB — POCT HEMOGLOBIN-HEMACUE: Hemoglobin: 11 g/dL — ABNORMAL LOW (ref 12.0–15.0)

## 2013-08-15 SURGERY — ULNAR NERVE DECOMPRESSION/TRANSPOSITION
Anesthesia: General | Site: Elbow | Laterality: Right

## 2013-08-15 MED ORDER — METHYLPREDNISOLONE ACETATE 40 MG/ML IJ SUSP
INTRAMUSCULAR | Status: AC
  Filled 2013-08-15: qty 1

## 2013-08-15 MED ORDER — FENTANYL CITRATE 0.05 MG/ML IJ SOLN: 50.0000 ug | INTRAMUSCULAR | Status: DC | PRN

## 2013-08-15 MED ORDER — CEFAZOLIN SODIUM-DEXTROSE 2-3 GM-% IV SOLR
INTRAVENOUS | Status: DC | PRN
  Administered 2013-08-15: 2 g via INTRAVENOUS

## 2013-08-15 MED ORDER — PROPOFOL 10 MG/ML IV EMUL
INTRAVENOUS | Status: AC
Start: 2013-08-15 — End: 2013-08-15
  Filled 2013-08-15: qty 150

## 2013-08-15 MED ORDER — FENTANYL CITRATE 0.05 MG/ML IJ SOLN: 25.0000 ug | INTRAMUSCULAR | Status: DC | PRN

## 2013-08-15 MED ORDER — OXYCODONE HCL 5 MG PO TABS: 5.0000 mg | ORAL_TABLET | Freq: Once | ORAL | Status: DC | PRN

## 2013-08-15 MED ORDER — CHLORHEXIDINE GLUCONATE 4 % EX LIQD: 60.0000 mL | Freq: Once | CUTANEOUS | Status: DC

## 2013-08-15 MED ORDER — LACTATED RINGERS IV SOLN
INTRAVENOUS | Status: DC
  Administered 2013-08-15: 07:00:00 via INTRAVENOUS

## 2013-08-15 MED ORDER — OXYCODONE-ACETAMINOPHEN 5-325 MG PO TABS: ORAL_TABLET | ORAL | Status: DC

## 2013-08-15 MED ORDER — OXYCODONE HCL 5 MG/5ML PO SOLN: 5.0000 mg | Freq: Once | ORAL | Status: DC | PRN

## 2013-08-15 MED ORDER — MEPERIDINE HCL 25 MG/ML IJ SOLN: 6.2500 mg | INTRAMUSCULAR | Status: DC | PRN

## 2013-08-15 MED ORDER — METHYLPREDNISOLONE ACETATE 80 MG/ML IJ SUSP
INTRAMUSCULAR | Status: AC
  Filled 2013-08-15: qty 1

## 2013-08-15 MED ORDER — DEXAMETHASONE SODIUM PHOSPHATE 10 MG/ML IJ SOLN
INTRAMUSCULAR | Status: DC | PRN
  Administered 2013-08-15: 5 mg via INTRAVENOUS

## 2013-08-15 MED ORDER — LIDOCAINE HCL (CARDIAC) 20 MG/ML IV SOLN
INTRAVENOUS | Status: DC | PRN
  Administered 2013-08-15: 40 mg via INTRAVENOUS

## 2013-08-15 MED ORDER — LIDOCAINE HCL 2 % IJ SOLN
INTRAMUSCULAR | Status: AC
  Filled 2013-08-15: qty 20

## 2013-08-15 MED ORDER — MIDAZOLAM HCL 2 MG/2ML IJ SOLN: 1.0000 mg | INTRAMUSCULAR | Status: DC | PRN

## 2013-08-15 MED ORDER — MIDAZOLAM HCL 2 MG/2ML IJ SOLN: 0.5000 mg | Freq: Once | INTRAMUSCULAR | Status: DC | PRN

## 2013-08-15 MED ORDER — PROPOFOL 10 MG/ML IV BOLUS
INTRAVENOUS | Status: DC | PRN
  Administered 2013-08-15: 150 mg via INTRAVENOUS
  Administered 2013-08-15: 50 mg via INTRAVENOUS

## 2013-08-15 MED ORDER — CEFAZOLIN SODIUM-DEXTROSE 2-3 GM-% IV SOLR
INTRAVENOUS | Status: AC
Start: 2013-08-15 — End: 2013-08-15
  Filled 2013-08-15: qty 50

## 2013-08-15 MED ORDER — MIDAZOLAM HCL 2 MG/2ML IJ SOLN
INTRAMUSCULAR | Status: AC
  Filled 2013-08-15: qty 2

## 2013-08-15 MED ORDER — MIDAZOLAM HCL 5 MG/5ML IJ SOLN
INTRAMUSCULAR | Status: DC | PRN
  Administered 2013-08-15: 2 mg via INTRAVENOUS

## 2013-08-15 MED ORDER — PROMETHAZINE HCL 25 MG/ML IJ SOLN: 6.2500 mg | INTRAMUSCULAR | Status: DC | PRN

## 2013-08-15 MED ORDER — FENTANYL CITRATE 0.05 MG/ML IJ SOLN
INTRAMUSCULAR | Status: AC
  Filled 2013-08-15: qty 4

## 2013-08-15 MED ORDER — ONDANSETRON HCL 4 MG/2ML IJ SOLN
INTRAMUSCULAR | Status: DC | PRN
  Administered 2013-08-15: 4 mg via INTRAVENOUS

## 2013-08-15 MED ORDER — LIDOCAINE HCL 2 % IJ SOLN
INTRAMUSCULAR | Status: DC | PRN
  Administered 2013-08-15: 2 mL

## 2013-08-15 MED ORDER — FENTANYL CITRATE 0.05 MG/ML IJ SOLN
INTRAMUSCULAR | Status: DC | PRN
  Administered 2013-08-15: 100 ug via INTRAVENOUS

## 2013-08-15 SURGICAL SUPPLY — 43 items
BANDAGE ADH SHEER 1  50/CT (GAUZE/BANDAGES/DRESSINGS) IMPLANT
BANDAGE ELASTIC 3 VELCRO ST LF (GAUZE/BANDAGES/DRESSINGS) ×2 IMPLANT
BANDAGE ELASTIC 4 VELCRO ST LF (GAUZE/BANDAGES/DRESSINGS) ×1 IMPLANT
BLADE 15 SAFETY STRL DISP (BLADE) ×2 IMPLANT
BLADE MINI RND TIP GREEN BEAV (BLADE) ×1 IMPLANT
BNDG CMPR 9X4 STRL LF SNTH (GAUZE/BANDAGES/DRESSINGS) ×1
BNDG ESMARK 4X9 LF (GAUZE/BANDAGES/DRESSINGS) ×2 IMPLANT
BRUSH SCRUB EZ PLAIN DRY (MISCELLANEOUS) ×2 IMPLANT
CORDS BIPOLAR (ELECTRODE) ×2 IMPLANT
COVER MAYO STAND STRL (DRAPES) ×2 IMPLANT
COVER TABLE BACK 60X90 (DRAPES) ×2 IMPLANT
CUFF TOURNIQUET SINGLE 18IN (TOURNIQUET CUFF) ×1 IMPLANT
DECANTER SPIKE VIAL GLASS SM (MISCELLANEOUS) IMPLANT
DRAPE EXTREMITY T 121X128X90 (DRAPE) ×2 IMPLANT
DRAPE SURG 17X23 STRL (DRAPES) ×2 IMPLANT
DRSG TEGADERM 4X4.75 (GAUZE/BANDAGES/DRESSINGS) ×2 IMPLANT
GAUZE SPONGE 4X4 12PLY STRL (GAUZE/BANDAGES/DRESSINGS) ×2 IMPLANT
GLOVE BIO SURGEON STRL SZ 6.5 (GLOVE) ×1 IMPLANT
GLOVE BIOGEL M STRL SZ7.5 (GLOVE) IMPLANT
GLOVE BIOGEL PI IND STRL 7.0 (GLOVE) IMPLANT
GLOVE BIOGEL PI INDICATOR 7.0 (GLOVE) ×1
GLOVE ORTHO TXT STRL SZ7.5 (GLOVE) ×2 IMPLANT
GOWN STRL REUS W/ TWL LRG LVL3 (GOWN DISPOSABLE) ×1 IMPLANT
GOWN STRL REUS W/ TWL XL LVL3 (GOWN DISPOSABLE) ×2 IMPLANT
GOWN STRL REUS W/TWL LRG LVL3 (GOWN DISPOSABLE) ×2
GOWN STRL REUS W/TWL XL LVL3 (GOWN DISPOSABLE) ×4
LOOP VESSEL MAXI BLUE (MISCELLANEOUS) IMPLANT
NEEDLE 27GAX1X1/2 (NEEDLE) ×1 IMPLANT
PACK BASIN DAY SURGERY FS (CUSTOM PROCEDURE TRAY) ×2 IMPLANT
PADDING CAST ABS 4INX4YD NS (CAST SUPPLIES)
PADDING CAST ABS COTTON 4X4 ST (CAST SUPPLIES) ×1 IMPLANT
SLEEVE SCD COMPRESS KNEE MED (MISCELLANEOUS) ×1 IMPLANT
SPONGE GAUZE 4X4 12PLY STER LF (GAUZE/BANDAGES/DRESSINGS) ×2 IMPLANT
STOCKINETTE 4X48 STRL (DRAPES) ×2 IMPLANT
STRIP CLOSURE SKIN 1/2X4 (GAUZE/BANDAGES/DRESSINGS) ×2 IMPLANT
SUT PROLENE 3 0 PS 2 (SUTURE) ×2 IMPLANT
SUT VIC AB 4-0 P-3 18XBRD (SUTURE) IMPLANT
SUT VIC AB 4-0 P3 18 (SUTURE)
SYR 3ML 23GX1 SAFETY (SYRINGE) IMPLANT
SYR BULB 3OZ (MISCELLANEOUS) ×1 IMPLANT
SYR CONTROL 10ML LL (SYRINGE) ×1 IMPLANT
TOWEL OR 17X24 6PK STRL BLUE (TOWEL DISPOSABLE) ×4 IMPLANT
UNDERPAD 30X30 INCONTINENT (UNDERPADS AND DIAPERS) ×2 IMPLANT

## 2013-08-15 NOTE — Op Note (Signed)
[id number] 

## 2013-08-15 NOTE — Anesthesia Procedure Notes (Signed)
Procedure Name: LMA Insertion Date/Time: 08/15/2013 7:42 AM Performed by: Maryella Shivers Pre-anesthesia Checklist: Patient identified, Emergency Drugs available, Suction available and Patient being monitored Patient Re-evaluated:Patient Re-evaluated prior to inductionOxygen Delivery Method: Circle System Utilized Preoxygenation: Pre-oxygenation with 100% oxygen Intubation Type: IV induction Ventilation: Mask ventilation without difficulty LMA: LMA inserted LMA Size: 4.0 Number of attempts: 1 Airway Equipment and Method: bite block Placement Confirmation: positive ETCO2 Tube secured with: Tape Dental Injury: Teeth and Oropharynx as per pre-operative assessment

## 2013-08-15 NOTE — Discharge Instructions (Addendum)

## 2013-08-15 NOTE — Brief Op Note (Signed)
08/15/2013  8:29 AM  PATIENT:  [name]  48 y.o. female  PRE-OPERATIVE DIAGNOSIS:  right ulnar nerve entrapment at cubital tunnel  POST-OPERATIVE DIAGNOSIS:  right ulnar nerve entrapment at cubital tunnnel  PROCEDURE:  Procedure(s): RIGHT ULNAR NERVE IN SITU DECOMPRESSION/POSSIBLE TRANSPOSITION (Right)  SURGEON:  Surgeon(s) and Role:    * Cammie Sickle., MD - Primary  PHYSICIAN ASSISTANT:   ASSISTANTS: surgical tech   ANESTHESIA:   general  EBL:  Total I/O In: 200 [I.V.:200] Out: -   BLOOD ADMINISTERED:none  DRAINS: none   LOCAL MEDICATIONS USED:  XYLOCAINE   SPECIMEN:  No Specimen  DISPOSITION OF SPECIMEN:  N/A  COUNTS:  YES  TOURNIQUET:   Total Tourniquet Time Documented: Upper Arm (Right) - 16 minutes Total: Upper Arm (Right) - 16 minutes   DICTATION: .Other Dictation: Dictation Number [id number]  PLAN OF CARE: Discharge to home after PACU  PATIENT DISPOSITION:  PACU - hemodynamically stable.   Delay start of Pharmacological VTE agent (>24hrs) due to surgical blood loss or risk of bleeding: not applicable

## 2013-08-15 NOTE — Anesthesia Preprocedure Evaluation (Addendum)
Anesthesia Evaluation  Patient identified by MRN, date of birth, ID band Patient awake    Reviewed: Allergy & Precautions, H&P , NPO status , Patient's Chart, lab work & pertinent test results  History of Anesthesia Complications Negative for: history of anesthetic complications  Airway Mallampati: II TM Distance: >3 FB Neck ROM: Full    Dental  (+) Dental Advisory Given   Pulmonary sleep apnea and Continuous Positive Airway Pressure Ventilation ,  breath sounds clear to auscultation  Pulmonary exam normal       Cardiovascular hypertension, Pt. on medications - anginaRhythm:Regular Rate:Normal     Neuro/Psych  Headaches, Depression    GI/Hepatic negative GI ROS, Neg liver ROS,   Endo/Other  diabetes (glu 95), Oral Hypoglycemic AgentsHyperthyroidism   Renal/GU negative Renal ROS     Musculoskeletal   Abdominal   Peds  Hematology  (+) Blood dyscrasia (Hb 11.0), anemia ,   Anesthesia Other Findings   Reproductive/Obstetrics                          Anesthesia Physical Anesthesia Plan  ASA: III  Anesthesia Plan: General   Post-op Pain Management:    Induction: Intravenous  Airway Management Planned: LMA  Additional Equipment:   Intra-op Plan:   Post-operative Plan:   Informed Consent: I have reviewed the patients History and Physical, chart, labs and discussed the procedure including the risks, benefits and alternatives for the proposed anesthesia with the patient or authorized representative who has indicated his/her understanding and acceptance.   Dental advisory given  Plan Discussed with: CRNA and Surgeon  Anesthesia Plan Comments: (Plan routine monitors, GA- LMA OK)        Anesthesia Quick Evaluation

## 2013-08-15 NOTE — Anesthesia Postprocedure Evaluation (Signed)
  Anesthesia Post-op Note  Patient: [name]  Procedure(s) Performed: Procedure(s): RIGHT ULNAR NERVE IN SITU DECOMPRESSION/POSSIBLE TRANSPOSITION (Right)  Patient Location: PACU  Anesthesia Type:General  Level of Consciousness: awake, alert , oriented and patient cooperative  Airway and Oxygen Therapy: Patient Spontanous Breathing  Post-op Pain: 2 /10, mild  Post-op Assessment: Post-op Vital signs reviewed, Patient's Cardiovascular Status Stable, Respiratory Function Stable, Patent Airway, No signs of Nausea or vomiting and Pain level controlled  Post-op Vital Signs: Reviewed and stable  Last Vitals:  Filed Vitals:   08/15/13 0943  BP: 122/78  Pulse: 75  Temp: 36.4 C  Resp: 16    Complications: No apparent anesthesia complications

## 2013-08-15 NOTE — Op Note (Signed)
NAMESHATONYA, PASSON NO.:  [id number]  MEDICAL RECORD NO.:  [id number]  LOCATION:                                 FACILITY:  PHYSICIAN:  [name], M.D.      DATE OF BIRTH:  DATE OF PROCEDURE:  08/15/2013 DATE OF DISCHARGE:                              OPERATIVE REPORT   PREOPERATIVE DIAGNOSIS:  Chronic ulnar entrapment neuropathy at cubital tunnel, right elbow with positive electrodiagnostic studies, documenting delayed conduction velocity, diminished amplitude of ulnar nerve conduction across right elbow with background type 2 diabetes.  POSTOPERATIVE DIAGNOSIS:  Chronic ulnar entrapment neuropathy at cubital tunnel, right elbow with positive electrodiagnostic studies, documenting delayed conduction velocity, diminished amplitude of ulnar nerve conduction across right elbow with background type 2 diabetes.  OPERATION:  In-situ decompression of right ulnar nerve.  OPERATING SURGEON:  [name], M.D.  ASSISTANT:  Surgical technician.  ANESTHESIA:  General by LMA.  SUPERVISING ANESTHESIOLOGIST:  [name], M.D.  INDICATIONS:  [name] is a 48 year old woman with type 2 diabetes.  I have known [name] for more than 4 years.  In 2011, we evaluated significant left hand numbness and identified left ulnar entrapment neuropathy at its cubital tunnel.  Electrodiagnostic studies in 2011, confirmed significant neuropathy. She responded quite well to an in-situ decompression.  We identified the fascial contracture of her arcuate ligament and the fascia at the heads of flexor carpi ulnaris.  [name] returned in 2015, reporting identical symptoms on the right as she experienced on the left to her primary care physician, Dr. Lucianne Lei.  We advised her to undergo repeat electrodiagnostic studies.  These confirmed the presence of significant ulnar entrapment neuropathy on the right side without evidence of median  neuropathy.  We advised her to undergo a similar in-situ decompression.  Once again, she was reminded of potential risks and benefits of surgery. With a history of diabetes, she has a somewhat increased risk of infection.  We can never guarantee complete recovery of the nerve in an entrapment neuropathy setting, although typically individuals will do quite well.  She understands at their baseline, risks of infection, anesthetic complication, uncomfortable scar, and failure to relieve all symptoms.  After detailed informed consent, she was brought to the operating room at this time.  DESCRIPTION OF PROCEDURE:  [name] was interviewed in the holding area and her right arm was marked as proper surgical site per protocol with a marking pen.  She had anesthesia interviewed by [name] and was recommended to undergo general anesthesia by LMA technique.  She accepted this and questions with [name] were invited and answered in detail.  She was subsequently transferred to room 2 of the [facility] where under Dr. Marisue Brooklyn direct supervision, general anesthesia by LMA technique was induced.  The right arm was prepped with Betadine soap and solution, sterilely draped.  A pneumatic tourniquet was applied proximally on the right brachium.  Following exsanguination of right arm with Esmarch bandage, arterial tourniquet inflated to 220 mmHg.  Following a routine surgical time-out, confirming administration of 2 g of Ancef as an IV prophylactic antibiotic, procedure commenced  with a 2- cm incision directly over the path of the ulnar nerve posterior to the medial epicondyle.  Subcutaneous tissues were carefully divided, taking care to identify and spare the medial antebrachial cutaneous sensory branches.  The ulnar nerve was identified by palpation.  The arcuate ligament was markedly thickened.  This was released along its ulnar border and the fascia at the heads of flexor  carpi ulnaris was released approximately 4 cm distal to the epicondyle.  The muscle fibers of the flexor carpi ulnaris were teased apart with a Insurance account manager and Freer and multiple fascial bands were noted within the muscle, causing compression of the ulnar nerve proper.  The brachial fascia was released proximally 4 cm above the cubital tunnel and no significant compression proximally was noted.  Bleeding points along the margin of the release of the fascia were electrocauterized with bipolar current.  We carefully examined the nerve after decompression over distance of approximately 10 cm in elbow range of motion 0 to 140 degrees of flexion.  The nerve was stable posterior to the epicondyle.  The tourniquet was released and bleeding points were addressed by bipolar cautery under saline.    The wound was then repaired with subcutaneous 4-0 Vicryl and intradermal 3-0 Prolene.  At the conclusion of procedure, there was no bleeding whatsoever from the wound margins nor from the fascia released.  A compressive dressing was applied with Steri-Strips, sterile gauze, Tegaderm followed by Ace wrap.  For aftercare, [name] was provided prescription for Percocet 5 mg 1 p.o. q.4-6 hours p.r.n. pain, 30 tablets without refill.  We will see her back for followup in the office in a week to examine her wound.  We will likely leave her suture in place for 2 weeks, but will begin immediate range of motion exercises in 24 hours.     Emily Mighty Sayyid Harewood, M.D.     RVS/MEDQ  D:  08/15/2013  T:  08/15/2013  Job:  [id number]  cc:   Lucianne Lei, M.D.

## 2013-08-15 NOTE — Transfer of Care (Signed)
Immediate Anesthesia Transfer of Care Note  Patient: [name]  Procedure(s) Performed: Procedure(s): RIGHT ULNAR NERVE IN SITU DECOMPRESSION/POSSIBLE TRANSPOSITION (Right)  Patient Location: PACU  Anesthesia Type:General  Level of Consciousness: awake, alert  and oriented  Airway & Oxygen Therapy: Patient Spontanous Breathing and Patient connected to face mask oxygen  Post-op Assessment: Report given to PACU RN and Post -op Vital signs reviewed and stable  Post vital signs: Reviewed and stable  Complications: No apparent anesthesia complications

## 2013-08-17 ENCOUNTER — Encounter (HOSPITAL_BASED_OUTPATIENT_CLINIC_OR_DEPARTMENT_OTHER): Payer: Self-pay | Admitting: Orthopedic Surgery

## 2013-08-24 ENCOUNTER — Encounter: Payer: Self-pay | Admitting: *Deleted

## 2013-08-24 ENCOUNTER — Ambulatory Visit: Payer: BC Managed Care – PPO | Admitting: Endocrinology

## 2013-08-24 DIAGNOSIS — Z0289 Encounter for other administrative examinations: Secondary | ICD-10-CM

## 2013-08-25 ENCOUNTER — Ambulatory Visit: Payer: BC Managed Care – PPO | Admitting: Family

## 2013-09-19 ENCOUNTER — Ambulatory Visit: Payer: BC Managed Care – PPO | Admitting: Family

## 2013-09-19 DIAGNOSIS — Z0289 Encounter for other administrative examinations: Secondary | ICD-10-CM

## 2013-11-02 ENCOUNTER — Telehealth: Payer: Self-pay | Admitting: Endocrinology

## 2013-11-02 ENCOUNTER — Other Ambulatory Visit: Payer: Self-pay | Admitting: *Deleted

## 2013-11-02 MED ORDER — METFORMIN HCL ER 500 MG PO TB24: 2000.0000 mg | ORAL_TABLET | Freq: Every day | ORAL | Status: DC

## 2013-11-02 MED ORDER — INSULIN PEN NEEDLE 31G X 5 MM MISC: Status: DC

## 2013-11-02 NOTE — Telephone Encounter (Signed)
rx sent for 30 day supply until seen in office

## 2013-11-02 NOTE — Telephone Encounter (Signed)
Patient had scheduled an appt with Dr. Dwyane Dee on Aug 31st  She needs Pen needles and Metformin  Sen to Out patient [facility]    Thank you :)

## 2013-11-10 ENCOUNTER — Encounter: Payer: Self-pay | Admitting: Family

## 2013-11-10 ENCOUNTER — Other Ambulatory Visit: Payer: BC Managed Care – PPO

## 2013-11-10 ENCOUNTER — Ambulatory Visit (INDEPENDENT_AMBULATORY_CARE_PROVIDER_SITE_OTHER): Payer: BC Managed Care – PPO | Admitting: Family

## 2013-11-10 ENCOUNTER — Other Ambulatory Visit: Payer: Self-pay | Admitting: *Deleted

## 2013-11-10 VITALS — BP 120/74 | HR 82 | Ht 63.0 in | Wt 162.0 lb

## 2013-11-10 DIAGNOSIS — E119 Type 2 diabetes mellitus without complications: Secondary | ICD-10-CM

## 2013-11-10 DIAGNOSIS — G43809 Other migraine, not intractable, without status migrainosus: Secondary | ICD-10-CM

## 2013-11-10 DIAGNOSIS — I10 Essential (primary) hypertension: Secondary | ICD-10-CM

## 2013-11-10 DIAGNOSIS — E042 Nontoxic multinodular goiter: Secondary | ICD-10-CM

## 2013-11-10 MED ORDER — AMITRIPTYLINE HCL 10 MG PO TABS: 10.0000 mg | ORAL_TABLET | Freq: Every day | ORAL | Status: DC

## 2013-11-10 MED ORDER — KETOROLAC TROMETHAMINE 60 MG/2ML IM SOLN
60.0000 mg | Freq: Once | INTRAMUSCULAR | Status: AC
  Administered 2013-11-10: 60 mg via INTRAMUSCULAR

## 2013-11-10 NOTE — Patient Instructions (Signed)

## 2013-11-10 NOTE — Addendum Note (Signed)
Addended by: Joyce Gross R on: 11/10/2013 05:13 PM   Modules accepted: Orders

## 2013-11-10 NOTE — Progress Notes (Signed)
Pre visit review using our clinic review tool, if applicable. No additional management support is needed unless otherwise documented below in the visit note. 

## 2013-11-10 NOTE — Progress Notes (Signed)
Subjective:    Patient ID: [name], female    DOB: 28-Mar-1966, 48 y.o.   MRN: [id number]  HPI 48 year old AAF, nonsmoker with a history of Type 2 DM, HTN, Hypercholesterolemia, and [place] currently under the care of endocrinology. She is here to get established. Reports having a headache that typically occurs daily lasting 4-5 hours. He has been taking Excedrin without relief. Pain 7/10. No nausea or vomiting.   Review of Systems  Constitutional: Negative.   Respiratory: Negative.   Cardiovascular: Negative.   Endocrine: Negative.  Negative for polydipsia and polyphagia.  Genitourinary: Negative.   Musculoskeletal: Negative.   Skin: Negative.   Allergic/Immunologic: Negative.   Neurological: Positive for headaches.  Hematological: Negative.   Psychiatric/Behavioral: Positive for sleep disturbance. The patient is nervous/anxious.    Past Medical History  Diagnosis Date  . Hypertension   . Diabetes mellitus without complication   . Hyperthyroidism   . Headache(784.0)     migraine  . Hyperlipidemia   . Depression   . Chicken pox     CHILDHOOD  . Sleep apnea     uses a cpap    History   Social History  . Marital Status: Divorced    Spouse Name: N/A    Number of Children: N/A  . Years of Education: N/A   Occupational History  . Not on file.   Social History Main Topics  . Smoking status: Never Smoker   . Smokeless tobacco: Not on file  . Alcohol Use: No  . Drug Use: No  . Sexual Activity: Not on file   Other Topics Concern  . Not on file   Social History Narrative  . No narrative on file    Past Surgical History  Procedure Laterality Date  . Abdominal hysterectomy    . Cholecystectomy    . Robotic assisted laparoscopic lysis of adhesion N/A 06/02/2012    Procedure: ROBOTIC ASSISTED LAPAROSCOPIC LYSIS OF ADHESION;  Surgeon: Marvene Staff, MD;  Location: [facility];  Service: Gynecology;  Laterality: N/A;  . Robotic assisted salpingo oopherectomy Left  06/02/2012    Procedure: ROBOTIC ASSISTED SALPINGO OOPHORECTOMY;  Surgeon: Marvene Staff, MD;  Location: [facility];  Service: Gynecology;  Laterality: Left;  pelvic washings  . Robot assisted myomectomy Left 06/02/2012    Procedure: ROBOTIC ASSISTED MYOMECTOMY;  Surgeon: Marvene Staff, MD;  Location: [facility];  Service: Gynecology;  Laterality: Left;  anexal mass is a myoma left side  . Ulnar nerve transposition Right 08/15/2013    Procedure: RIGHT ULNAR NERVE IN SITU DECOMPRESSION/POSSIBLE TRANSPOSITION;  Surgeon: Cammie Sickle., MD;  Location: [facility];  Service: Orthopedics;  Laterality: Right;    Family History  Problem Relation Age of Onset  . Arthritis Mother   . Arthritis Father   . Hyperlipidemia Father   . Diabetes Father   . Hypertension Sister   . Mental illness Sister   . Diabetes Sister   . Diabetes Brother   . Cancer Paternal Aunt     BREAST    No Known Allergies  Current Outpatient Prescriptions on File Prior to Visit  Medication Sig Dispense Refill  . aspirin-acetaminophen-caffeine (EXCEDRIN MIGRAINE) 250-250-65 MG per tablet Take 1 tablet by mouth every 6 (six) hours as needed for pain (headache).      Marland Kitchen glucose blood (ONETOUCH VERIO) test strip Use as instructed to check blood sugars 2 times per day dx code 250.00  100 each  5  .  Insulin Pen Needle (B-D UF III MINI PEN NEEDLES) 31G X 5 MM MISC Use one daily to inject victoza  30 each  0  . Liraglutide 18 MG/3ML SOPN Inject 1.2 mg into the skin daily.  2 pen  5  . lisinopril (PRINIVIL,ZESTRIL) 10 MG tablet Take 1 tablet (10 mg total) by mouth daily.  30 tablet  5  . metFORMIN (GLUCOPHAGE-XR) 500 MG 24 hr tablet Take 4 tablets (2,000 mg total) by mouth daily with supper.  120 tablet  0  . pravastatin (PRAVACHOL) 40 MG tablet Take 1 tablet (40 mg total) by mouth daily.  30 tablet  5   No current facility-administered medications on file prior to visit.    BP 120/74  Pulse 82  Ht 5\' 3"   (1.6 m)  Wt 162 lb (73.483 kg)  BMI 28.70 kg/m2chart    Objective:   Physical Exam  Constitutional: She is oriented to person, place, and time. She appears well-developed and well-nourished.  HENT:  Right Ear: External ear normal.  Left Ear: External ear normal.  Nose: Nose normal.  Mouth/Throat: Oropharynx is clear and moist.  Eyes: Pupils are equal, round, and reactive to light.  Neck: Normal range of motion. Neck supple. No thyromegaly present.  Cardiovascular: Normal rate, regular rhythm and normal heart sounds.   Pulmonary/Chest: Effort normal and breath sounds normal.  Abdominal: Soft. Bowel sounds are normal.  Musculoskeletal: Normal range of motion.  Neurological: She is alert and oriented to person, place, and time.  Skin: Skin is warm and dry.  Psychiatric: She has a normal mood and affect.          Assessment & Plan:  Meiko was seen today for establish care.  Diagnoses and associated orders for this visit:  Unspecified essential hypertension  Type II or unspecified type diabetes mellitus without mention of complication, not stated as uncontrolled - Hemoglobin A1c - Lipid panel - Comprehensive metabolic panel  Multinodular goiter - TSH - T4, free  Other type of migraine without status migrainosus - ketorolac (TORADOL) injection 60 mg; Inject 2 mLs (60 mg total) into the muscle once.  Other Orders - amitriptyline (ELAVIL) 10 MG tablet; Take 1 tablet (10 mg total) by mouth at bedtime.   Call the office with any questions or concerns. Recheck in 3 months and sooner as needed.

## 2013-11-15 ENCOUNTER — Other Ambulatory Visit (INDEPENDENT_AMBULATORY_CARE_PROVIDER_SITE_OTHER): Payer: BC Managed Care – PPO

## 2013-11-15 ENCOUNTER — Other Ambulatory Visit: Payer: Self-pay | Admitting: *Deleted

## 2013-11-15 DIAGNOSIS — E042 Nontoxic multinodular goiter: Secondary | ICD-10-CM

## 2013-11-15 DIAGNOSIS — E119 Type 2 diabetes mellitus without complications: Secondary | ICD-10-CM

## 2013-11-15 LAB — LIPID PANEL
Cholesterol: 118 mg/dL (ref 0–200)
HDL: 44.8 mg/dL (ref >39.00)
LDL Cholesterol: 52 mg/dL (ref 0–99)
NonHDL: 73.2
Total CHOL/HDL Ratio: 3
Triglycerides: 105 mg/dL (ref 0.0–149.0)
VLDL: 21 mg/dL (ref 0.0–40.0)

## 2013-11-15 LAB — COMPREHENSIVE METABOLIC PANEL
ALT: 8 U/L (ref 0–35)
AST: 17 U/L (ref 0–37)
Albumin: 3.9 g/dL (ref 3.5–5.2)
Alkaline Phosphatase: 51 U/L (ref 39–117)
BUN: 9 mg/dL (ref 6–23)
CO2: 30 mEq/L (ref 19–32)
Calcium: 9.4 mg/dL (ref 8.4–10.5)
Chloride: 102 mEq/L (ref 96–112)
Creatinine, Ser: 0.8 mg/dL (ref 0.4–1.2)
GFR: 96.75 mL/min (ref >60.00)
Glucose, Bld: 98 mg/dL (ref 70–99)
Potassium: 4.5 mEq/L (ref 3.5–5.1)
Sodium: 138 mEq/L (ref 135–145)
Total Bilirubin: 0.5 mg/dL (ref 0.2–1.2)
Total Protein: 7.8 g/dL (ref 6.0–8.3)

## 2013-11-15 LAB — T4, FREE: Free T4: 0.92 ng/dL (ref 0.60–1.60)

## 2013-11-15 LAB — TSH: TSH: 0.49 u[IU]/mL (ref 0.35–4.50)

## 2013-11-15 LAB — HEMOGLOBIN A1C: Hgb A1c MFr Bld: 7.4 % — ABNORMAL HIGH (ref 4.6–6.5)

## 2013-11-27 ENCOUNTER — Encounter: Payer: Self-pay | Admitting: Endocrinology

## 2013-11-27 ENCOUNTER — Other Ambulatory Visit: Payer: Self-pay | Admitting: *Deleted

## 2013-11-27 ENCOUNTER — Ambulatory Visit (INDEPENDENT_AMBULATORY_CARE_PROVIDER_SITE_OTHER): Payer: BC Managed Care – PPO | Admitting: Endocrinology

## 2013-11-27 VITALS — BP 128/82 | HR 100 | Temp 97.7°F | Resp 14 | Ht 63.0 in | Wt 159.6 lb

## 2013-11-27 DIAGNOSIS — I10 Essential (primary) hypertension: Secondary | ICD-10-CM

## 2013-11-27 DIAGNOSIS — IMO0001 Reserved for inherently not codable concepts without codable children: Secondary | ICD-10-CM

## 2013-11-27 DIAGNOSIS — E785 Hyperlipidemia, unspecified: Secondary | ICD-10-CM

## 2013-11-27 DIAGNOSIS — E1165 Type 2 diabetes mellitus with hyperglycemia: Principal | ICD-10-CM

## 2013-11-27 LAB — GLUCOSE, POCT (MANUAL RESULT ENTRY): POC Glucose: 152 mg/dl — AB (ref 70–99)

## 2013-11-27 MED ORDER — LIRAGLUTIDE 18 MG/3ML ~~LOC~~ SOPN: PEN_INJECTOR | SUBCUTANEOUS | Status: DC

## 2013-11-27 MED ORDER — GLUCOSE BLOOD VI STRP
ORAL_STRIP | Status: DC
Start: 2013-11-27 — End: 2014-04-30

## 2013-11-27 NOTE — Progress Notes (Signed)
Patient ID: [name], female   DOB: 05-Jul-1965, 48 y.o.   MRN: [id number]   Reason for Appointment: Diabetes follow-up   History of Present Illness   Diagnosis: Type 2 DIABETES MELITUS  Previous history: Her previous records are not available at present  Recent history: Her blood sugars are usually  well controlled with using 1.2 mg Victoza along with metformin. However has not followed up since 2/15 She also has been able to keep her weight down and had lost another 9 pounds since her last visit However is probably not checking her blood sugars very often and did not bring her monitor for review Her A1c has gone up from her last visit and she thinks she has not been consistent with her diet as far as types of foods and portions     Oral hypoglycemic drugs: Metformin  Side effects from medications: ? GI intolerance with high dose Metformin        Monitors blood glucose: Once a day or less.    Glucometer: One Touch.          Blood Glucose readings from recall <140   Hypoglycemia frequency:  none        Meals: 2 meals per day. Largest meal 6:30 pm; occasionally goes off diet         Physical activity: exercise: is relatively active at work, uses home DVD for exercise 3/7 days       Wt Readings from Last 3 Encounters:  11/27/13 159 lb 9.6 oz (72.394 kg)  11/10/13 162 lb (73.483 kg)  08/15/13 159 lb (72.122 kg)   Lab Results  Component Value Date   HGBA1C 7.4* 11/15/2013   HGBA1C 6.7* 08/07/2013   HGBA1C 7.4* 05/02/2013   Lab Results  Component Value Date   MICROALBUR 6.4* 05/02/2013   LDLCALC 52 11/15/2013   CREATININE 0.8 11/15/2013       Medication List       This list is accurate as of: 11/27/13  3:20 PM.  Always use your most recent med list.               amitriptyline 10 MG tablet  Commonly known as:  ELAVIL  Take 1 tablet (10 mg total) by mouth at bedtime.     aspirin-acetaminophen-caffeine 778-242-35 MG per tablet  Commonly known as:  EXCEDRIN MIGRAINE   Take 1 tablet by mouth every 6 (six) hours as needed for pain (headache).     glucose blood test strip  Commonly known as:  ONETOUCH VERIO  Use as instructed to check blood sugars 2 times per day dx code 250.00     Insulin Pen Needle 31G X 5 MM Misc  Commonly known as:  B-D UF III MINI PEN NEEDLES  Use one daily to inject victoza     Liraglutide 18 MG/3ML Sopn  Inject 1.2 mg into the skin daily.     lisinopril 10 MG tablet  Commonly known as:  PRINIVIL,ZESTRIL  Take 1 tablet (10 mg total) by mouth daily.     metFORMIN 500 MG 24 hr tablet  Commonly known as:  GLUCOPHAGE-XR  Take 4 tablets (2,000 mg total) by mouth daily with supper.     pravastatin 40 MG tablet  Commonly known as:  PRAVACHOL  Take 1 tablet (40 mg total) by mouth daily.        Allergies: No Known Allergies  Past Medical History  Diagnosis Date  . Hypertension   . Diabetes mellitus without complication   .  Hyperthyroidism   . Headache(784.0)     migraine  . Hyperlipidemia   . Depression   . Chicken pox     CHILDHOOD  . Sleep apnea     uses a cpap    Past Surgical History  Procedure Laterality Date  . Abdominal hysterectomy    . Cholecystectomy    . Robotic assisted laparoscopic lysis of adhesion N/A 06/02/2012    Procedure: ROBOTIC ASSISTED LAPAROSCOPIC LYSIS OF ADHESION;  Surgeon: Marvene Staff, MD;  Location: [facility];  Service: Gynecology;  Laterality: N/A;  . Robotic assisted salpingo oopherectomy Left 06/02/2012    Procedure: ROBOTIC ASSISTED SALPINGO OOPHORECTOMY;  Surgeon: Marvene Staff, MD;  Location: [facility];  Service: Gynecology;  Laterality: Left;  pelvic washings  . Robot assisted myomectomy Left 06/02/2012    Procedure: ROBOTIC ASSISTED MYOMECTOMY;  Surgeon: Marvene Staff, MD;  Location: [facility];  Service: Gynecology;  Laterality: Left;  anexal mass is a myoma left side  . Ulnar nerve transposition Right 08/15/2013    Procedure: RIGHT ULNAR NERVE IN SITU  DECOMPRESSION/POSSIBLE TRANSPOSITION;  Surgeon: Cammie Sickle., MD;  Location: [facility];  Service: Orthopedics;  Laterality: Right;    Family History  Problem Relation Age of Onset  . Arthritis Mother   . Arthritis Father   . Hyperlipidemia Father   . Diabetes Father   . Hypertension Sister   . Mental illness Sister   . Diabetes Sister   . Diabetes Brother   . Cancer Paternal Aunt     BREAST    Social History:  reports that she has never smoked. She does not have any smokeless tobacco history on file. She reports that she does not drink alcohol or use illicit drugs.  Review of Systems:   Hypertension:  she is on lisinopril 10 mg alone though for hypertension with good control  Lipids: Has been on pravastatin for quite some time and her levels are better with improved compliance. Is followed by PCP  Lab Results  Component Value Date   CHOL 118 11/15/2013   HDL 44.80 11/15/2013   LDLCALC 52 11/15/2013   LDLDIRECT 144* 04/02/2006   TRIG 105.0 11/15/2013   CHOLHDL 3 11/15/2013       She has a long-standing history of euthyroid multinodular goiter. No local discomfort, pressure sensation or difficulty swallowing  Lab Results  Component Value Date   TSH 0.49 11/15/2013      Examination:   BP 128/82  Pulse 100  Temp(Src) 97.7 F (36.5 C)  Resp 14  Ht 5\' 3"  (1.6 m)  Wt 159 lb 9.6 oz (72.394 kg)  BMI 28.28 kg/m2  SpO2 97%  Body mass index is 28.28 kg/(m^2).   No pedal edema  She has a very large diffuse goiter, about 4 times normal on the right and 3 times on the left, soft to firm  ASSESSMENT/ PLAN:  Diabetes type 2   Blood glucose control appears good with near-normal readings at home However A1c is again higher than expected and may be related to higher postprandial readings are inconsistent diet She needs to start monitoring readings after meals and bring her monitor for review She recently has not been  motivated to watch her diet   However is trying to exercise now and overall has kept her weight down Will review her home readings again before suggesting any changes in treatment  HYPERTENSION: Her blood pressure is well-controlled and is requiring only 10 mg lisinopril now  Hyperlipidemia: She is on pravastatin. She is taking this more regularly now and LDL is better    [facility] 11/27/2013, 3:20 PM   Office Visit on 11/27/2013  Component Date Value Ref Range Status  . POC Glucose 11/27/2013 152* 70 - 99 mg/dl Final

## 2013-11-27 NOTE — Patient Instructions (Signed)
Please check blood sugars at least half the time about 2 hours after any meal and 2-3 times per week on waking up. Please bring blood sugar monitor to each visit  Try Victoza 1.8 mg daily

## 2013-11-29 ENCOUNTER — Ambulatory Visit: Payer: BC Managed Care – PPO | Admitting: Family

## 2013-12-05 ENCOUNTER — Ambulatory Visit (INDEPENDENT_AMBULATORY_CARE_PROVIDER_SITE_OTHER): Payer: BC Managed Care – PPO | Admitting: Family

## 2013-12-05 ENCOUNTER — Encounter: Payer: Self-pay | Admitting: Family

## 2013-12-05 VITALS — BP 120/90 | HR 87 | Wt 159.0 lb

## 2013-12-05 DIAGNOSIS — R51 Headache: Secondary | ICD-10-CM

## 2013-12-05 DIAGNOSIS — E7800 Pure hypercholesterolemia, unspecified: Secondary | ICD-10-CM

## 2013-12-05 DIAGNOSIS — E1165 Type 2 diabetes mellitus with hyperglycemia: Secondary | ICD-10-CM

## 2013-12-05 DIAGNOSIS — IMO0001 Reserved for inherently not codable concepts without codable children: Secondary | ICD-10-CM

## 2013-12-05 DIAGNOSIS — I10 Essential (primary) hypertension: Secondary | ICD-10-CM

## 2013-12-05 MED ORDER — PRAVASTATIN SODIUM 40 MG PO TABS: 40.0000 mg | ORAL_TABLET | Freq: Every day | ORAL | Status: DC

## 2013-12-05 NOTE — Patient Instructions (Signed)
Cluster Headache Cluster headaches are recognized by their pattern of deep, intense head pain. They normally occur on one side of your head, but they may "switch sides" in subsequent episodes. Typically, cluster headaches:   Are severe in nature.   Occur repeatedly over weeks to months and are followed by periods of no headaches.   Can last from 15 minutes to 3 hours.   Occur at the same time each day, often at night.   Occur several times a day. CAUSES The exact cause of cluster headaches is not known. Alcohol use may be associated with cluster headaches. SIGNS AND SYMPTOMS   Severe pain that begins in or around your eye or temple.   One-sided head pain.   Feeling sick to your stomach (nauseous).   Sensitivity to light.   Runny nose.   Eye redness, tearing, and nasal stuffiness on the side of your head where you are experiencing pain.   Sweaty, pale skin of the face.   Droopy or swollen eyelid.   Restlessness. DIAGNOSIS  Cluster headaches are diagnosed based on symptoms and a physical exam. Your health care provider may order a CT scan or an MRI of your head or lab tests to see if your headaches are caused by other medical conditions.  TREATMENT   Medicines for pain relief and to prevent recurrent attacks. Some people may need a combination of medicines.  Oxygen for pain relief.   Biofeedback programs to help reduce headache pain.  It may be helpful to keep a headache diary. This may help you find a trend for what is triggering your headaches. Your health care provider can develop a treatment plan.  HOME CARE INSTRUCTIONS  During cluster periods:   Follow a regular sleep schedule. Do not vary the amount and time that you sleep from day to day. It is important to stay on the same schedule during a cluster period to help prevent headaches.   Avoid alcohol.   Stop smoking if you smoke.  SEEK MEDICAL CARE IF:  You have any changes from your previous  cluster headaches either in intensity or frequency.   You are not getting relief from medicines you are taking.  SEEK IMMEDIATE MEDICAL CARE IF:   You faint.   You have weakness or numbness, especially on one side of your body or face.   You have double vision.   You have nausea or vomiting that is not relieved within several hours.   You cannot keep your balance or have difficulty talking or walking.   You have neck pain or stiffness.   You have a fever. MAKE SURE YOU:  Understand these instructions.   Will watch your condition.   Will get help right away if you are not doing well or get worse. Document Released: 03/16/2005 Document Revised: 01/04/2013 Document Reviewed: 10/06/2012 Exit[employer] Patient Information 2015 [employer]. This information is not intended to replace advice given to you by your health care provider. Make sure you discuss any questions you have with your health care provider.  

## 2013-12-05 NOTE — Progress Notes (Signed)
Pre visit review using our clinic review tool, if applicable. No additional management support is needed unless otherwise documented below in the visit note. 

## 2013-12-07 NOTE — Progress Notes (Signed)
Subjective:    Patient ID: [name], female    DOB: 12/31/1965, 48 y.o.   MRN: [id number]  HPI 48 year old AAF, nonsmoker is in today for a recheck of headaches. She was started on Amitriptyline once daily at bedtime that has helped the frequency of her headaches. She received a Toradol injection that helped. She would like to continue current medication at the same dose although she continues to get a headache about 5 days a week. She has a history of Hypertension, Type 2 DM.   Review of Systems  Constitutional: Negative.   HENT: Negative for congestion, postnasal drip and sore throat.   Respiratory: Negative.   Cardiovascular: Negative.   Gastrointestinal: Negative.   Endocrine: Negative.   Genitourinary: Negative.   Musculoskeletal: Negative.   Skin: Negative.   Neurological: Positive for headaches.  Psychiatric/Behavioral: Negative.    Past Medical History  Diagnosis Date  . Hypertension   . Diabetes mellitus without complication   . Hyperthyroidism   . Headache(784.0)     migraine  . Hyperlipidemia   . Depression   . Chicken pox     CHILDHOOD  . Sleep apnea     uses a cpap    History   Social History  . Marital Status: Divorced    Spouse Name: N/A    Number of Children: N/A  . Years of Education: N/A   Occupational History  . Not on file.   Social History Main Topics  . Smoking status: Never Smoker   . Smokeless tobacco: Not on file  . Alcohol Use: No  . Drug Use: No  . Sexual Activity: Not on file   Other Topics Concern  . Not on file   Social History Narrative  . No narrative on file    Past Surgical History  Procedure Laterality Date  . Abdominal hysterectomy    . Cholecystectomy    . Robotic assisted laparoscopic lysis of adhesion N/A 06/02/2012    Procedure: ROBOTIC ASSISTED LAPAROSCOPIC LYSIS OF ADHESION;  Surgeon: Marvene Staff, MD;  Location: [facility];  Service: Gynecology;  Laterality: N/A;  . Robotic assisted salpingo  oopherectomy Left 06/02/2012    Procedure: ROBOTIC ASSISTED SALPINGO OOPHORECTOMY;  Surgeon: Marvene Staff, MD;  Location: [facility];  Service: Gynecology;  Laterality: Left;  pelvic washings  . Robot assisted myomectomy Left 06/02/2012    Procedure: ROBOTIC ASSISTED MYOMECTOMY;  Surgeon: Marvene Staff, MD;  Location: [facility];  Service: Gynecology;  Laterality: Left;  anexal mass is a myoma left side  . Ulnar nerve transposition Right 08/15/2013    Procedure: RIGHT ULNAR NERVE IN SITU DECOMPRESSION/POSSIBLE TRANSPOSITION;  Surgeon: Cammie Sickle., MD;  Location: [facility];  Service: Orthopedics;  Laterality: Right;    Family History  Problem Relation Age of Onset  . Arthritis Mother   . Arthritis Father   . Hyperlipidemia Father   . Diabetes Father   . Hypertension Sister   . Mental illness Sister   . Diabetes Sister   . Diabetes Brother   . Cancer Paternal Aunt     BREAST    No Known Allergies  Current Outpatient Prescriptions on File Prior to Visit  Medication Sig Dispense Refill  . amitriptyline (ELAVIL) 10 MG tablet Take 1 tablet (10 mg total) by mouth at bedtime.  30 tablet  1  . aspirin-acetaminophen-caffeine (EXCEDRIN MIGRAINE) 481-856-31 MG per tablet Take 1 tablet by mouth every 6 (six) hours as needed for  pain (headache).      Marland Kitchen glucose blood (ONETOUCH VERIO) test strip Use as instructed to check blood sugars 2 times per day dx code 250.00  100 each  5  . Insulin Pen Needle (B-D UF III MINI PEN NEEDLES) 31G X 5 MM MISC Use one daily to inject victoza  30 each  0  . Liraglutide 18 MG/3ML SOPN Inject 1.8 mg daily  3 pen  3  . lisinopril (PRINIVIL,ZESTRIL) 10 MG tablet Take 1 tablet (10 mg total) by mouth daily.  30 tablet  5  . metFORMIN (GLUCOPHAGE-XR) 500 MG 24 hr tablet Take 4 tablets (2,000 mg total) by mouth daily with supper.  120 tablet  0   No current facility-administered medications on file prior to visit.    BP 120/90  Pulse 87  Wt  159 lb (72.122 kg)chart    Objective:   Physical Exam  Constitutional: She is oriented to person, place, and time. She appears well-developed and well-nourished.  HENT:  Right Ear: External ear normal.  Left Ear: External ear normal.  Nose: Nose normal.  Mouth/Throat: Oropharynx is clear and moist.  Eyes: Pupils are equal, round, and reactive to light.  Neck: Normal range of motion. Neck supple.  Cardiovascular: Normal rate, regular rhythm and normal heart sounds.   Pulmonary/Chest: Effort normal and breath sounds normal.  Musculoskeletal: Normal range of motion.  Neurological: She is alert and oriented to person, place, and time.  Skin: Skin is warm and dry.  Psychiatric: She has a normal mood and affect.          Assessment & Plan:  [place] was seen today for follow-up.  Diagnoses and associated orders for this visit:  Unspecified essential hypertension  Type II or unspecified type diabetes mellitus without mention of complication, uncontrolled  Headache(784.0)  Pure hypercholesterolemia  Other Orders - pravastatin (PRAVACHOL) 40 MG tablet; Take 1 tablet (40 mg total) by mouth daily.    Call the office with any questions or concerns. Recheck as scheduled and as needed. Continue current meds.

## 2013-12-18 ENCOUNTER — Other Ambulatory Visit: Payer: Self-pay | Admitting: Endocrinology

## 2014-02-21 ENCOUNTER — Other Ambulatory Visit: Payer: BC Managed Care – PPO

## 2014-02-26 ENCOUNTER — Ambulatory Visit: Payer: BC Managed Care – PPO | Admitting: Endocrinology

## 2014-04-16 IMAGING — CT CT ABD-PELV W/ CM
2 of 5 series · 17 of 46 positions shown, 19 images · IV contrast (READICAT/WATER & [ID] OMNI 300)
Comparison: None.

CLINICAL DATA: Lower abdominal pain, pelvic mass palpated on
physical exam, some nausea

CT ABDOMEN AND PELVIS WITH CONTRAST
TECHNIQUE: Multidetector CT imaging of the abdomen and pelvis was
performed following the standard protocol during bolus
administration of intravenous contrast.
Contrast: 100mL OMNIPAQUE IOHEXOL 300 MG/ML  SOLN

[Series 2: abd/pelvis with · axial · 0.70mm/px · z∈[-379,+31]mm · 14 of 92 slices shown, 16 images]
[im 5/92  soft-tissue]
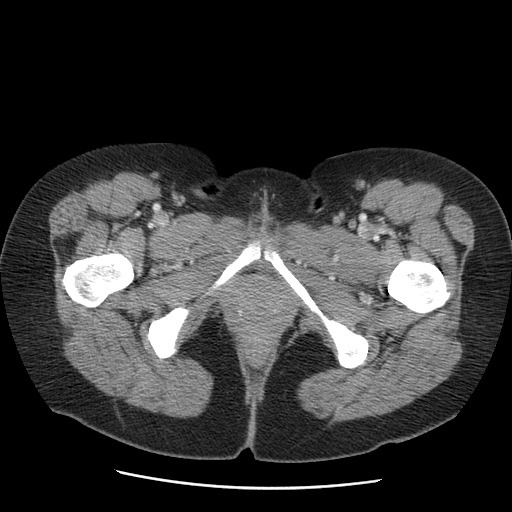
[im 5/92  bone]
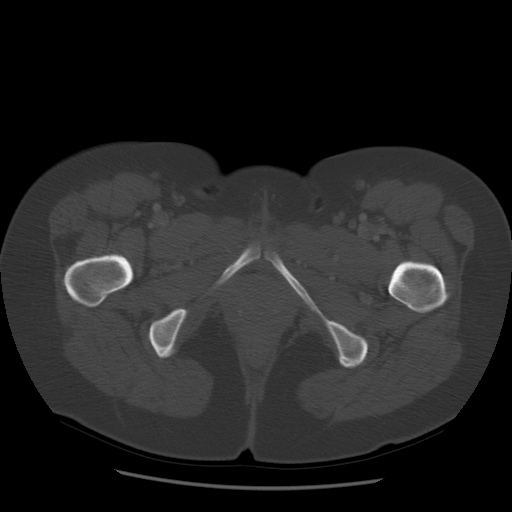
[im 14/92  soft-tissue]
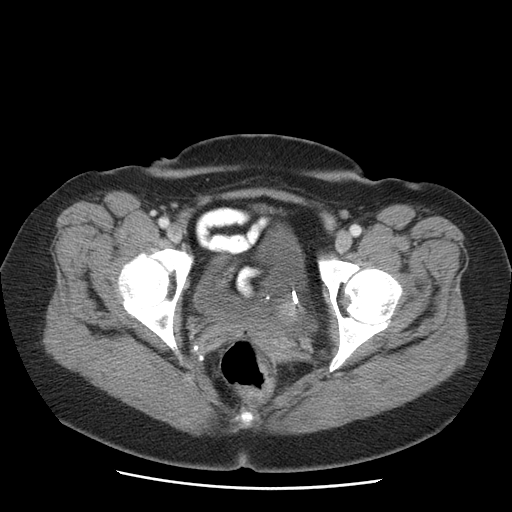
[im 19/92  soft-tissue]
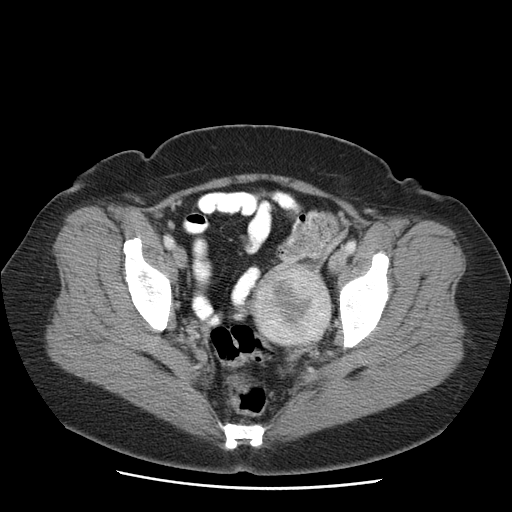
[im 23/92  soft-tissue]
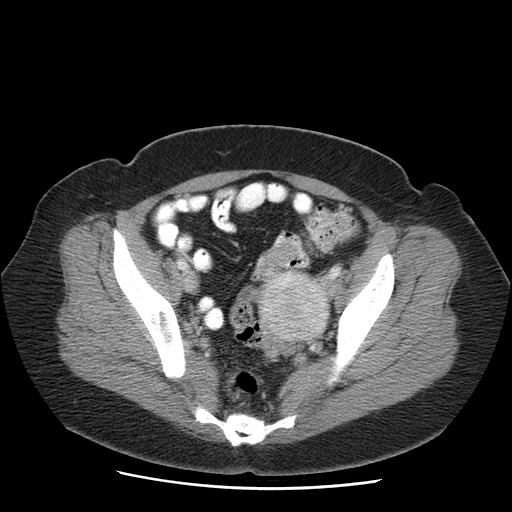
[im 32/92  soft-tissue]
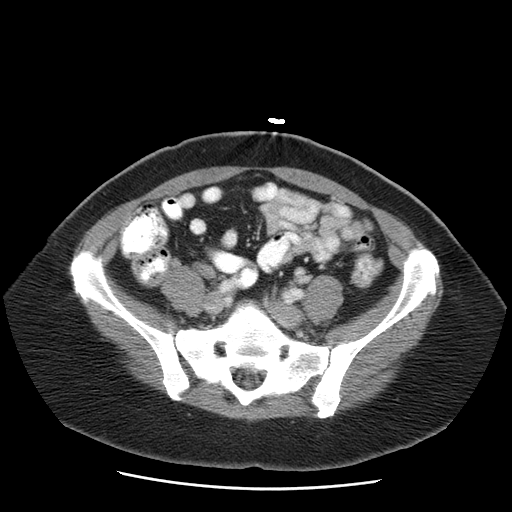
[im 37/92  soft-tissue]
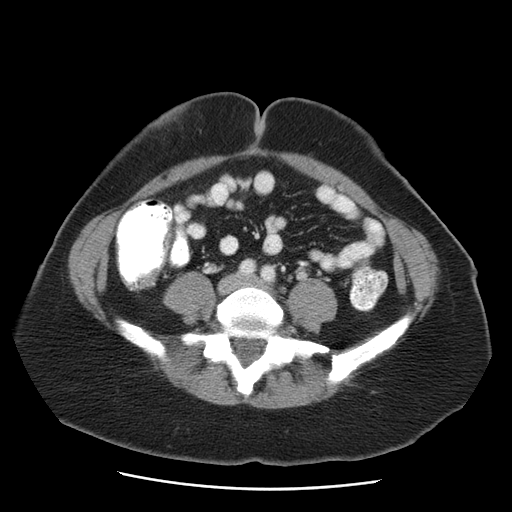
[im 41/92  soft-tissue]
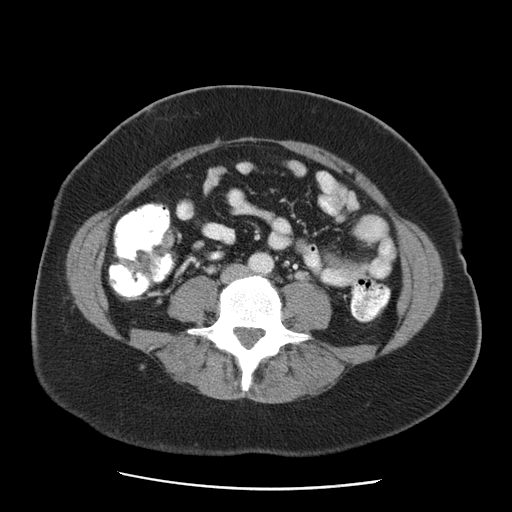
[im 51/92  soft-tissue]
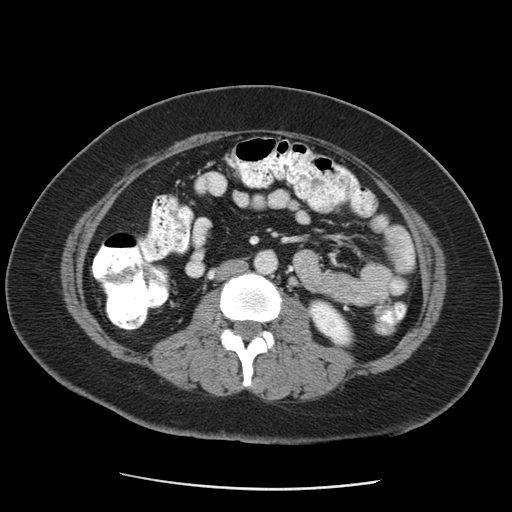
[im 55/92  soft-tissue]
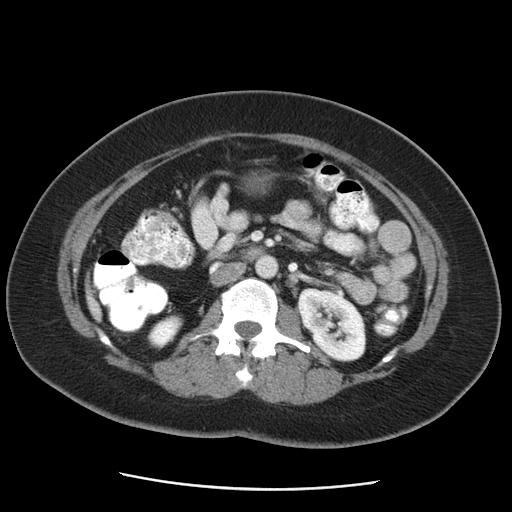
[im 55/92  bone]
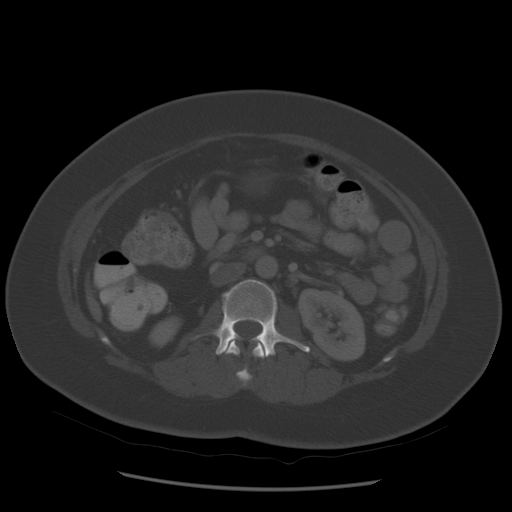
[im 60/92  soft-tissue]
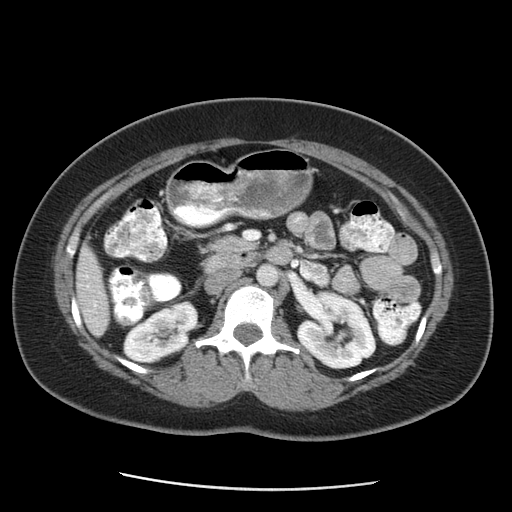
[im 69/92  soft-tissue]
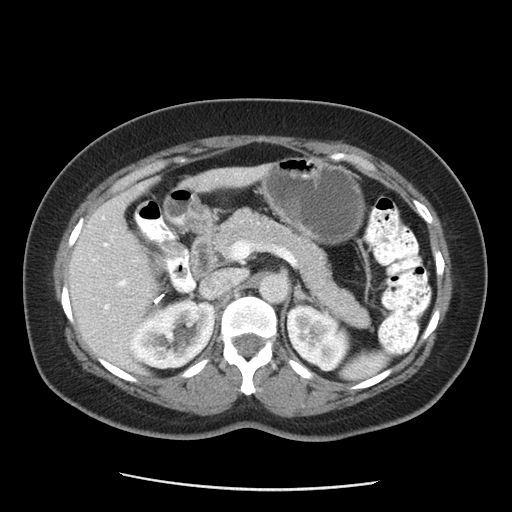
[im 73/92  soft-tissue]
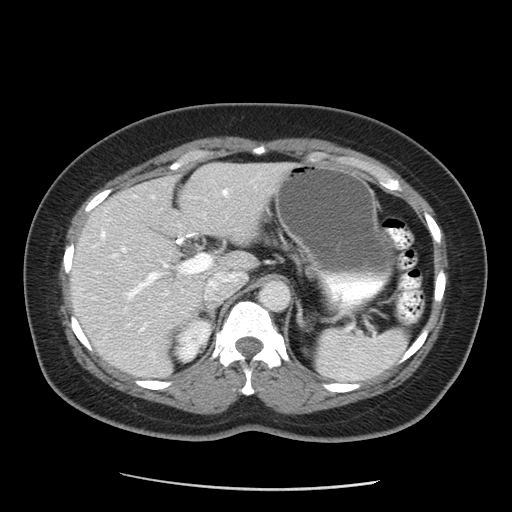
[im 78/92  soft-tissue]
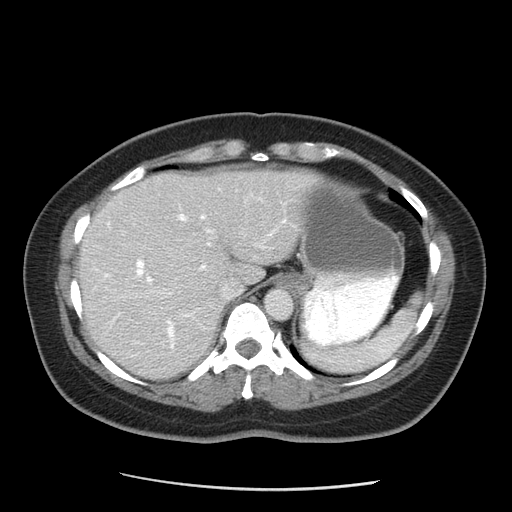
[im 87/92  soft-tissue]
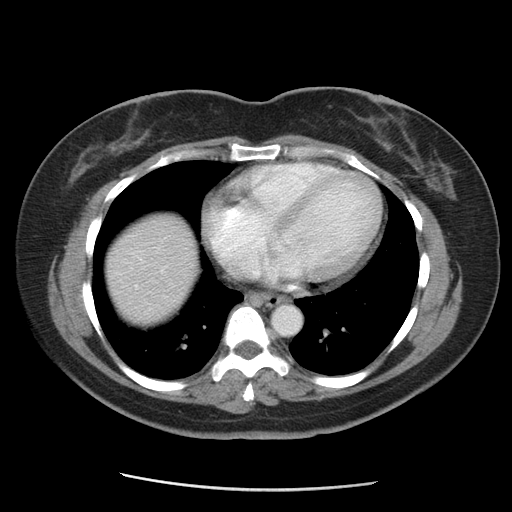

[Series 400: coronal · coronal · 0.96mm/px · 3 of 119 slices shown]
[im 40/119  soft-tissue]
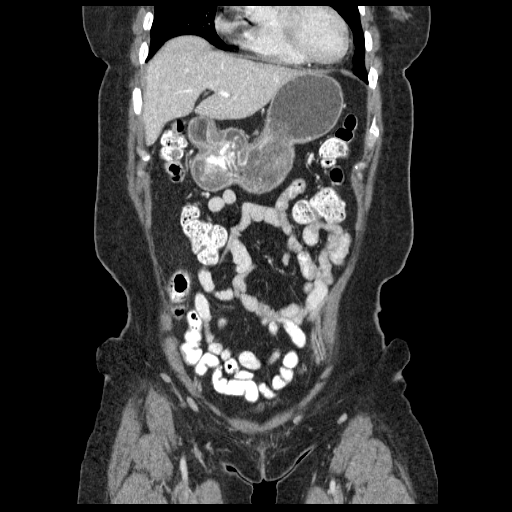
[im 53/119  soft-tissue]
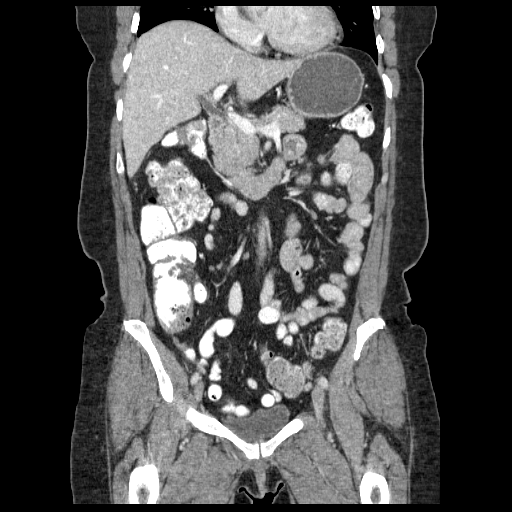
[im 66/119  soft-tissue]
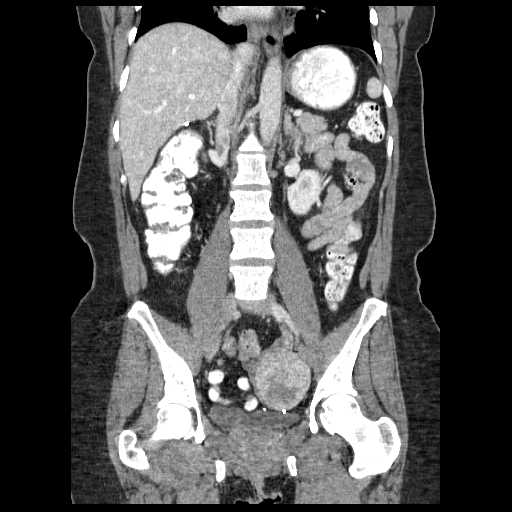

[17 of 46 positions shown; findings below may reference images not displayed]

FINDINGS: The lung bases are clear.  The liver enhances with no
focal abnormality and no ductal dilatation is seen.  Surgical clips
are present from prior cholecystectomy.  The pancreas is normal in
size and the pancreatic duct is not dilated.  The adrenal glands
and spleen are unremarkable.  The stomach is moderately fluid
distended with no abnormality noted.  The kidneys enhance with no
calculus or mass and on delayed images, the pelvocaliceal systems
are unremarkable.  The abdominal aorta is normal in caliber.  No
adenopathy is seen.

A few small lymph nodes are present in the right lower quadrant.
By history the patient has undergone prior hysterectomy.  However,
there is an inhomogeneous soft tissue mass within the left adnexa
measuring 5.8 x 5.4 x 5.8 cm.  This is worrisome for ovarian
malignancy.  The right ovary is not visualized.  No free fluid is
seen within the pelvis.  The urinary bladder is unremarkable.  No
abnormality of the colon is seen.  The terminal ileum is
unremarkable.  The appendix is faintly visualized low in the right
lower quadrant - pelvis and is unremarkable.  No skeletal
abnormality is seen.
IMPRESSION: 1.  5.8 cm inhomogeneous mass in the left adnexa worrisome for
ovarian malignancy. No free fluid is seen.
2. The appendix and terminal ileum are unremarkable.

## 2014-04-30 ENCOUNTER — Telehealth: Payer: Self-pay | Admitting: Endocrinology

## 2014-04-30 ENCOUNTER — Other Ambulatory Visit: Payer: Self-pay | Admitting: *Deleted

## 2014-04-30 MED ORDER — METFORMIN HCL ER 500 MG PO TB24
ORAL_TABLET | ORAL | Status: DC
Start: 2014-04-30 — End: 2014-06-18

## 2014-04-30 MED ORDER — GLUCOSE BLOOD VI STRP: ORAL_STRIP | Status: DC

## 2014-04-30 MED ORDER — LIRAGLUTIDE 18 MG/3ML ~~LOC~~ SOPN: PEN_INJECTOR | SUBCUTANEOUS | Status: DC

## 2014-04-30 NOTE — Telephone Encounter (Signed)
Pt hasn't had a meter for the last 2.5 wks. She needs new one-possible sample  Also please call in metformin and new test strips for whatever meter we give and call in [place].

## 2014-05-28 ENCOUNTER — Other Ambulatory Visit (INDEPENDENT_AMBULATORY_CARE_PROVIDER_SITE_OTHER): Payer: BLUE CROSS/BLUE SHIELD

## 2014-05-28 DIAGNOSIS — IMO0002 Reserved for concepts with insufficient information to code with codable children: Secondary | ICD-10-CM

## 2014-05-28 DIAGNOSIS — E1165 Type 2 diabetes mellitus with hyperglycemia: Secondary | ICD-10-CM

## 2014-05-28 LAB — COMPREHENSIVE METABOLIC PANEL
ALT: 9 U/L (ref 0–35)
AST: 13 U/L (ref 0–37)
Albumin: 4.2 g/dL (ref 3.5–5.2)
Alkaline Phosphatase: 62 U/L (ref 39–117)
BUN: 11 mg/dL (ref 6–23)
CO2: 27 mEq/L (ref 19–32)
Calcium: 9.8 mg/dL (ref 8.4–10.5)
Chloride: 108 mEq/L (ref 96–112)
Creatinine, Ser: 0.79 mg/dL (ref 0.40–1.20)
GFR: 99.36 mL/min (ref >60.00)
Glucose, Bld: 131 mg/dL — ABNORMAL HIGH (ref 70–99)
Potassium: 4.4 mEq/L (ref 3.5–5.1)
Sodium: 142 mEq/L (ref 135–145)
Total Bilirubin: 0.3 mg/dL (ref 0.2–1.2)
Total Protein: 7.8 g/dL (ref 6.0–8.3)

## 2014-05-28 LAB — HEMOGLOBIN A1C: Hgb A1c MFr Bld: 7.8 % — ABNORMAL HIGH (ref 4.6–6.5)

## 2014-05-31 ENCOUNTER — Ambulatory Visit (INDEPENDENT_AMBULATORY_CARE_PROVIDER_SITE_OTHER): Payer: BLUE CROSS/BLUE SHIELD | Admitting: Endocrinology

## 2014-05-31 ENCOUNTER — Encounter: Payer: Self-pay | Admitting: Endocrinology

## 2014-05-31 VITALS — BP 122/62 | HR 100 | Temp 97.6°F | Resp 16 | Ht 63.0 in | Wt 160.8 lb

## 2014-05-31 DIAGNOSIS — E1165 Type 2 diabetes mellitus with hyperglycemia: Secondary | ICD-10-CM

## 2014-05-31 DIAGNOSIS — I10 Essential (primary) hypertension: Secondary | ICD-10-CM

## 2014-05-31 DIAGNOSIS — IMO0002 Reserved for concepts with insufficient information to code with codable children: Secondary | ICD-10-CM

## 2014-05-31 NOTE — Patient Instructions (Signed)
Please check blood sugars at least half the time about 2 hours after any meal and 3 times per week on waking up. Please bring blood sugar monitor to each visit. Recommended blood sugar levels about 2 hours after meal is 140-180 and on waking up 90-130  Walk or home DVD for exercise   Plan meals!

## 2014-05-31 NOTE — Progress Notes (Signed)
Patient ID: [name], female   DOB: 07/08/1965, 49 y.o.   MRN: [id number]   Reason for Appointment: Diabetes follow-up   History of Present Illness   Diagnosis: Type 2 DIABETES MELITUS  Recent history: Her blood sugars are usually  well controlled with using 1.2 mg Victoza along with metformin. Previously was able to also lose some weight with Victoza and this was improving her satiety. Her A1c has gone up from her last visit  This may be partly related to her not being consistent with her dietas far as types of foods and portions Also has not been exercising as before Her blood sugars are being checked mostly in the morning and were relatively high until about a few days ago.  By mistake her pharmacy filled her prescription for Victoza 1.8 mg and she been doing this about 7-10 days, blood sugars appear to be better at least in the morning     Oral hypoglycemic drugs: Metformin 2g  Side effects from medications: mild GI intolerance with high dose  Metformin        Monitors blood glucose: Once a day or less.    Glucometer: One Touch.          Blood Glucose readings from monitor download: Mostly checking fasting readings with range 128-187 and midday 102-134 Only one reading after supper off 151 Overall average 143  Hypoglycemia frequency:  none        Meals: 2 meals per day. Largest meal 6:30 pm; occasionally goes off diet         Physical activity: exercise: is relatively active at work, not using home DVD for exercise  Has not seen a dietitian in some time   Wt Readings from Last 3 Encounters:  05/31/14 160 lb 12.8 oz (72.938 kg)  12/05/13 159 lb (72.122 kg)  11/27/13 159 lb 9.6 oz (72.394 kg)   Lab Results  Component Value Date   HGBA1C 7.8* 05/28/2014   HGBA1C 7.4* 11/15/2013   HGBA1C 6.7* 08/07/2013   Lab Results  Component Value Date   MICROALBUR 6.4* 05/02/2013   LDLCALC 52 11/15/2013   CREATININE 0.79 05/28/2014       Medication List       This list is  accurate as of: 05/31/14  3:58 PM.  Always use your most recent med list.               amitriptyline 10 MG tablet  Commonly known as:  ELAVIL  Take 1 tablet (10 mg total) by mouth at bedtime.     aspirin-acetaminophen-caffeine 751-025-85 MG per tablet  Commonly known as:  EXCEDRIN MIGRAINE  Take 1 tablet by mouth every 6 (six) hours as needed for pain (headache).     baclofen 10 MG tablet  Commonly known as:  LIORESAL     glucose blood test strip  Commonly known as:  ONETOUCH VERIO  Use as instructed to check blood sugars 2 times per day dx code E11.9     Insulin Pen Needle 31G X 5 MM Misc  Commonly known as:  B-D UF III MINI PEN NEEDLES  Use one daily to inject victoza     Liraglutide 18 MG/3ML Sopn  Inject 1.8 mg daily     lisinopril 10 MG tablet  Commonly known as:  PRINIVIL,ZESTRIL  Take 1 tablet (10 mg total) by mouth daily.     metFORMIN 500 MG 24 hr tablet  Commonly known as:  GLUCOPHAGE-XR  TAKE 4 TABLETS (2,000 MG  TOTAL) BY MOUTH DAILY WITH SUPPER.     pravastatin 40 MG tablet  Commonly known as:  PRAVACHOL  Take 1 tablet (40 mg total) by mouth daily.     zonisamide 25 MG capsule  Commonly known as:  ZONEGRAN        Allergies: No Known Allergies  Past Medical History  Diagnosis Date  . Hypertension   . Diabetes mellitus without complication   . Hyperthyroidism   . Headache(784.0)     migraine  . Hyperlipidemia   . Depression   . Chicken pox     CHILDHOOD  . Sleep apnea     uses a cpap    Past Surgical History  Procedure Laterality Date  . Abdominal hysterectomy    . Cholecystectomy    . Robotic assisted laparoscopic lysis of adhesion N/A 06/02/2012    Procedure: ROBOTIC ASSISTED LAPAROSCOPIC LYSIS OF ADHESION;  Surgeon: Marvene Staff, MD;  Location: [facility];  Service: Gynecology;  Laterality: N/A;  . Robotic assisted salpingo oopherectomy Left 06/02/2012    Procedure: ROBOTIC ASSISTED SALPINGO OOPHORECTOMY;  Surgeon: Marvene Staff, MD;  Location: [facility];  Service: Gynecology;  Laterality: Left;  pelvic washings  . Robot assisted myomectomy Left 06/02/2012    Procedure: ROBOTIC ASSISTED MYOMECTOMY;  Surgeon: Marvene Staff, MD;  Location: [facility];  Service: Gynecology;  Laterality: Left;  anexal mass is a myoma left side  . Ulnar nerve transposition Right 08/15/2013    Procedure: RIGHT ULNAR NERVE IN SITU DECOMPRESSION/POSSIBLE TRANSPOSITION;  Surgeon: Cammie Sickle., MD;  Location: [facility];  Service: Orthopedics;  Laterality: Right;    Family History  Problem Relation Age of Onset  . Arthritis Mother   . Arthritis Father   . Hyperlipidemia Father   . Diabetes Father   . Hypertension Sister   . Mental illness Sister   . Diabetes Sister   . Diabetes Brother   . Cancer Paternal Aunt     BREAST    Social History:  reports that she has never smoked. She does not have any smokeless tobacco history on file. She reports that she does not drink alcohol or use illicit drugs.  Review of Systems:   Hypertension:  she is on lisinopril 10 mg alone though for hypertension with good control  Lipids: Has been on pravastatin for quite some time and her levels are better with improved compliance. Is followed by PCP  Lab Results  Component Value Date   CHOL 118 11/15/2013   HDL 44.80 11/15/2013   LDLCALC 52 11/15/2013   LDLDIRECT 144* 04/02/2006   TRIG 105.0 11/15/2013   CHOLHDL 3 11/15/2013       She has a long-standing history of euthyroid multinodular goiter. No local discomfort, pressure sensation or difficulty swallowing  Lab Results  Component Value Date   TSH 0.49 11/15/2013      Examination:   BP 122/62 mmHg  Pulse 100  Temp(Src) 97.6 F (36.4 C)  Resp 16  Ht 5\' 3"  (1.6 m)  Wt 160 lb 12.8 oz (72.938 kg)  BMI 28.49 kg/m2  SpO2 97%  Body mass index is 28.49 kg/(m^2).   No pedal edema  She has a very large diffuse goiter, about 4 times normal on the right and 3  times on the left, soft to firm  ASSESSMENT/ PLAN:  Diabetes type 2   Blood glucose control appears to be somewhat worse with A1c nearly 8% now Although she is having relatively  high fasting readings last month they appear to be somewhat better recently See history of present illness for current management, problems identified in blood sugar better She is not checking her blood sugars adequately and generally none after meals especially supper She can also do better with diet and exercise; encouraged her to plan her meals better and also have a time set up for exercise She agrees to see the dietitian to help with meal planning More recently has taken 1.8 mg of Victoza which is probably helping with her hyperglycemia and satiety Discussed blood sugar targets, A1c goals and need for consistent lifestyle measures  HYPERTENSION: Her blood pressure is well-controlled     Patient Instructions  Please check blood sugars at least half the time about 2 hours after any meal and 3 times per week on waking up. Please bring blood sugar monitor to each visit. Recommended blood sugar levels about 2 hours after meal is 140-180 and on waking up 90-130  Walk or home DVD for exercise   Plan meals!      Merwin Breden 05/31/2014, 3:58 PM   Lab on 05/28/2014  Component Date Value Ref Range Status  . Hgb A1c MFr Bld 05/28/2014 7.8* 4.6 - 6.5 % Final   Glycemic Control Guidelines for People with Diabetes:Non Diabetic:  <6%Goal of Therapy: <7%Additional Action Suggested:  >8%   . Sodium 05/28/2014 142  135 - 145 mEq/L Final  . Potassium 05/28/2014 4.4  3.5 - 5.1 mEq/L Final  . Chloride 05/28/2014 108  96 - 112 mEq/L Final  . CO2 05/28/2014 27  19 - 32 mEq/L Final  . Glucose, Bld 05/28/2014 131* 70 - 99 mg/dL Final  . BUN 05/28/2014 11  6 - 23 mg/dL Final  . Creatinine, Ser 05/28/2014 0.79  0.40 - 1.20 mg/dL Final  . Total Bilirubin 05/28/2014 0.3  0.2 - 1.2 mg/dL Final  . Alkaline Phosphatase  05/28/2014 62  39 - 117 U/L Final  . AST 05/28/2014 13  0 - 37 U/L Final  . ALT 05/28/2014 9  0 - 35 U/L Final  . Total Protein 05/28/2014 7.8  6.0 - 8.3 g/dL Final  . Albumin 05/28/2014 4.2  3.5 - 5.2 g/dL Final  . Calcium 05/28/2014 9.8  8.4 - 10.5 mg/dL Final  . GFR 05/28/2014 99.36  >60.00 mL/min Final

## 2014-06-01 LAB — HM DIABETES EYE EXAM

## 2014-06-01 NOTE — Addendum Note (Signed)
Addended by: Elayne Snare on: 06/01/2014 11:01 AM   Modules accepted: Orders

## 2014-06-05 ENCOUNTER — Ambulatory Visit: Payer: BC Managed Care – PPO | Admitting: Family

## 2014-06-14 ENCOUNTER — Encounter: Payer: Self-pay | Admitting: Family Medicine

## 2014-06-14 ENCOUNTER — Ambulatory Visit (INDEPENDENT_AMBULATORY_CARE_PROVIDER_SITE_OTHER): Payer: BLUE CROSS/BLUE SHIELD | Admitting: Family Medicine

## 2014-06-14 ENCOUNTER — Encounter: Payer: Self-pay | Admitting: *Deleted

## 2014-06-14 VITALS — BP 144/100 | HR 81 | Temp 98.2°F | Ht 64.25 in | Wt 159.4 lb

## 2014-06-14 DIAGNOSIS — Z23 Encounter for immunization: Secondary | ICD-10-CM | POA: Diagnosis not present

## 2014-06-14 DIAGNOSIS — E7800 Pure hypercholesterolemia, unspecified: Secondary | ICD-10-CM

## 2014-06-14 DIAGNOSIS — G43009 Migraine without aura, not intractable, without status migrainosus: Secondary | ICD-10-CM | POA: Diagnosis not present

## 2014-06-14 DIAGNOSIS — E119 Type 2 diabetes mellitus without complications: Secondary | ICD-10-CM

## 2014-06-14 DIAGNOSIS — I10 Essential (primary) hypertension: Secondary | ICD-10-CM

## 2014-06-14 DIAGNOSIS — G4730 Sleep apnea, unspecified: Secondary | ICD-10-CM | POA: Diagnosis not present

## 2014-06-14 MED ORDER — LISINOPRIL 10 MG PO TABS: 10.0000 mg | ORAL_TABLET | Freq: Every day | ORAL | Status: DC

## 2014-06-14 MED ORDER — PRAVASTATIN SODIUM 40 MG PO TABS: 40.0000 mg | ORAL_TABLET | Freq: Every day | ORAL | Status: DC

## 2014-06-14 NOTE — Progress Notes (Signed)
Pre visit review using our clinic review tool, if applicable. No additional management support is needed unless otherwise documented below in the visit note. 

## 2014-06-14 NOTE — Patient Instructions (Signed)
BEFORE YOU LEAVE: -pneumonia vaccine -schedule CPE in 3 months  -We placed a referral for you as discussed for the sleep apnea. It usually takes about 1-2 weeks to process and schedule this referral. If you have not heard from [place] regarding this appointment in 2 weeks please contact our office.  We recommend the following healthy lifestyle measures: - eat a healthy diet consisting of lots of vegetables, fruits, beans, nuts, seeds, healthy meats such as white chicken and fish and whole grains.  - avoid fried foods, fast food, processed foods, sodas, red meet and other fattening foods.  - get a least 150 minutes of aerobic exercise per week.

## 2014-06-14 NOTE — Progress Notes (Signed)
HPI:  [name] is here to establish care Last PCP and physical:  Has the following chronic problems that require follow up and concerns today:  DM/Multinodular goiter/Hypothyroid: -sees Dr. Dwyane Dee for this, s/p RAI per her report for goiter, not on thyroid medicaiton -eye exam: reports had her eye exam last week -diet and exercise: no regular exercise, diet is not good -Meds: asa, lisinopril, metformin 2000mg  daily, victoza -no low blood sugars, denies complications  HLD: -on pravastatin -denies: no leg cramps or cog changes  HTN: -ran out of her lisinopril several days ago -reports bp is good when on medication -denies: CP, SOB, DOE  Migraines: -chronic -seeing Dr. Domingo Cocking in neurology -has been trying shots -on zonisamide recently and baclofen prn  Depression: -On a medication in the past -now doing CBT with Richardo Priest -she denies hx self harm of SI  OSA: -she reports was seeing a specialist a very long time ago and was on CPAP > 10 years ago -wants referral to see sleep doctor and retest -snores, excessive daytime somnelence, apneic  Hx iron def anemia when menstruated, s/p hysterectomy  ROS negative for unless reported above: fevers, unintentional weight loss, hearing or vision loss, chest pain, palpitations, struggling to breath, hemoptysis, melena, hematochezia, hematuria, falls, loc, si, thoughts of self harm  Past Medical History  Diagnosis Date  . Hypertension   . Diabetes mellitus without complication   . Hyperthyroidism   . Headache(784.0)     migraine  . Hyperlipidemia   . Depression   . Chicken pox     CHILDHOOD  . Sleep apnea     uses a cpap  . Multinodular goiter 12/28/2012    Past Surgical History  Procedure Laterality Date  . Abdominal hysterectomy    . Cholecystectomy    . Robotic assisted laparoscopic lysis of adhesion N/A 06/02/2012    Procedure: ROBOTIC ASSISTED LAPAROSCOPIC LYSIS OF ADHESION;  Surgeon: Marvene Staff, MD;   Location: [facility];  Service: Gynecology;  Laterality: N/A;  . Robotic assisted salpingo oopherectomy Left 06/02/2012    Procedure: ROBOTIC ASSISTED SALPINGO OOPHORECTOMY;  Surgeon: Marvene Staff, MD;  Location: [facility];  Service: Gynecology;  Laterality: Left;  pelvic washings  . Robot assisted myomectomy Left 06/02/2012    Procedure: ROBOTIC ASSISTED MYOMECTOMY;  Surgeon: Marvene Staff, MD;  Location: [facility];  Service: Gynecology;  Laterality: Left;  anexal mass is a myoma left side  . Ulnar nerve transposition Right 08/15/2013    Procedure: RIGHT ULNAR NERVE IN SITU DECOMPRESSION/POSSIBLE TRANSPOSITION;  Surgeon: Cammie Sickle., MD;  Location: [facility];  Service: Orthopedics;  Laterality: Right;    Family History  Problem Relation Age of Onset  . Arthritis Mother   . Arthritis Father   . Hyperlipidemia Father   . Diabetes Father   . Hypertension Sister   . Mental illness Sister   . Diabetes Sister   . Diabetes Brother   . Cancer Paternal Aunt     BREAST    History   Social History  . Marital Status: Divorced    Spouse Name: N/A  . Number of Children: N/A  . Years of Education: N/A   Social History Main Topics  . Smoking status: Never Smoker   . Smokeless tobacco: Not on file  . Alcohol Use: No  . Drug Use: No  . Sexual Activity: Not on file   Other Topics Concern  . 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  aspirin 81 MG tablet, Take 81 mg by mouth daily., Disp: , Rfl:  .  baclofen (LIORESAL) 10 MG tablet, , Disp: , Rfl: 0 .  glucose blood (ONETOUCH VERIO) test strip, Use as instructed to check blood sugars 2 times per day dx code E11.9, Disp: 100 each, Rfl: 0 .  Insulin Pen Needle (B-D UF  III MINI PEN NEEDLES) 31G X 5 MM MISC, Use one daily to inject victoza, Disp: 30 each, Rfl: 0 .  Liraglutide 18 MG/3ML SOPN, Inject 1.8 mg daily, Disp: 3 pen, Rfl: 0 .  lisinopril (PRINIVIL,ZESTRIL) 10 MG tablet, Take 1 tablet (10 mg total) by mouth daily., Disp: 90 tablet, Rfl: 5 .  metFORMIN (GLUCOPHAGE-XR) 500 MG 24 hr tablet, TAKE 4 TABLETS (2,000 MG TOTAL) BY MOUTH DAILY WITH SUPPER., Disp: 120 tablet, Rfl: 0 .  pravastatin (PRAVACHOL) 40 MG tablet, Take 1 tablet (40 mg total) by mouth daily., Disp: 90 tablet, Rfl: 3 .  zonisamide (ZONEGRAN) 25 MG capsule, , Disp: , Rfl: 0  EXAM:  Filed Vitals:   06/14/14 1606  BP: 144/100  Pulse: 81  Temp: 98.2 F (36.8 C)    Body mass index is 27.15 kg/(m^2).  GENERAL: vitals reviewed and listed above, alert, oriented, appears well hydrated and in no acute distress  HEENT: atraumatic, conjunttiva clear, no obvious abnormalities on inspection of external nose and ears  NECK: no obvious masses on inspection  LUNGS: clear to auscultation bilaterally, no wheezes, rales or rhonchi, good air movement  CV: HRRR, no peripheral edema  MS: moves all extremities without noticeable abnormality  PSYCH: pleasant and cooperative, no obvious depression or anxiety  ASSESSMENT AND PLAN:  Discussed the following assessment and plan:  Sleep apnea - Plan: Ambulatory referral to Pulmonology  HYPERCHOLESTEROLEMIA - Plan: pravastatin (PRAVACHOL) 40 MG tablet  Migraine without aura and without status migrainosus, not intractable  HYPERTENSION, BENIGN SYSTEMIC - Plan: lisinopril (PRINIVIL,ZESTRIL) 10 MG tablet  Type 2 diabetes mellitus without complication   -We reviewed the PMH, PSH, FH, SH, Meds and Allergies. -We provided refills for any medications we will prescribe as needed. -We addressed current concerns per orders and patient instructions. -We have asked for records for pertinent exams, studies, vaccines and notes from previous providers. -We  have advised patient to follow up per instructions below.   -Patient advised to return or notify a doctor immediately if symptoms worsen or persist or new concerns arise.  Patient Instructions  BEFORE YOU LEAVE: -pneumonia vaccine -schedule CPE in 3 months  -We placed a referral for you as discussed for the sleep apnea. It usually takes about 1-2 weeks to process and schedule this referral. If you have not heard from [place] regarding this appointment in 2 weeks please contact our office.  We recommend the following healthy lifestyle measures: - eat a healthy diet consisting of lots of vegetables, fruits, beans, nuts, seeds, healthy meats such as white chicken and fish and whole grains.  - avoid fried foods, fast food, processed foods, sodas, red meet and other fattening foods.  - get a least 150 minutes of aerobic exercise per week.        Colin Benton R.

## 2014-06-14 NOTE — Addendum Note (Signed)
Addended by: Agnes Lawrence on: 06/14/2014 05:08 PM   Modules accepted: Orders

## 2014-06-18 ENCOUNTER — Other Ambulatory Visit: Payer: Self-pay | Admitting: *Deleted

## 2014-06-18 ENCOUNTER — Telehealth: Payer: Self-pay | Admitting: Endocrinology

## 2014-06-18 MED ORDER — METFORMIN HCL ER 500 MG PO TB24: ORAL_TABLET | ORAL | Status: DC

## 2014-06-18 NOTE — Telephone Encounter (Signed)
Patient called and would like a refill on her medication   Rx: Metformin  Pharmacy: [facility]    Thank you

## 2014-06-18 NOTE — Telephone Encounter (Signed)
rx sent

## 2014-08-01 ENCOUNTER — Institutional Professional Consult (permissible substitution): Payer: BLUE CROSS/BLUE SHIELD | Admitting: Pulmonary Disease

## 2014-08-28 ENCOUNTER — Other Ambulatory Visit: Payer: BLUE CROSS/BLUE SHIELD

## 2014-08-29 ENCOUNTER — Encounter: Payer: Self-pay | Admitting: Endocrinology

## 2014-08-31 ENCOUNTER — Ambulatory Visit: Payer: BLUE CROSS/BLUE SHIELD | Admitting: Endocrinology

## 2014-09-21 ENCOUNTER — Encounter: Payer: BLUE CROSS/BLUE SHIELD | Admitting: Family Medicine

## 2014-10-02 ENCOUNTER — Encounter: Payer: Self-pay | Admitting: *Deleted

## 2014-10-02 ENCOUNTER — Ambulatory Visit (INDEPENDENT_AMBULATORY_CARE_PROVIDER_SITE_OTHER): Payer: BLUE CROSS/BLUE SHIELD | Admitting: Family Medicine

## 2014-10-02 ENCOUNTER — Encounter: Payer: Self-pay | Admitting: Family Medicine

## 2014-10-02 VITALS — BP 122/82 | HR 74 | Temp 98.2°F | Ht 64.25 in | Wt 159.7 lb

## 2014-10-02 DIAGNOSIS — E669 Obesity, unspecified: Secondary | ICD-10-CM

## 2014-10-02 DIAGNOSIS — Z0000 Encounter for general adult medical examination without abnormal findings: Secondary | ICD-10-CM | POA: Diagnosis not present

## 2014-10-02 DIAGNOSIS — E7800 Pure hypercholesterolemia, unspecified: Secondary | ICD-10-CM

## 2014-10-02 DIAGNOSIS — I10 Essential (primary) hypertension: Secondary | ICD-10-CM | POA: Diagnosis not present

## 2014-10-02 DIAGNOSIS — E119 Type 2 diabetes mellitus without complications: Secondary | ICD-10-CM

## 2014-10-02 LAB — BASIC METABOLIC PANEL
BUN: 13 mg/dL (ref 6–23)
CO2: 28 mEq/L (ref 19–32)
Calcium: 9.8 mg/dL (ref 8.4–10.5)
Chloride: 105 mEq/L (ref 96–112)
Creatinine, Ser: 0.82 mg/dL (ref 0.40–1.20)
GFR: 95.05 mL/min (ref >60.00)
Glucose, Bld: 148 mg/dL — ABNORMAL HIGH (ref 70–99)
Potassium: 4.6 mEq/L (ref 3.5–5.1)
Sodium: 142 mEq/L (ref 135–145)

## 2014-10-02 LAB — LIPID PANEL
Cholesterol: 173 mg/dL (ref 0–200)
HDL: 57.5 mg/dL (ref >39.00)
LDL Cholesterol: 99 mg/dL (ref 0–99)
NonHDL: 115.5
Total CHOL/HDL Ratio: 3
Triglycerides: 85 mg/dL (ref 0.0–149.0)
VLDL: 17 mg/dL (ref 0.0–40.0)

## 2014-10-02 LAB — HEMOGLOBIN A1C: Hgb A1c MFr Bld: 7.6 % — ABNORMAL HIGH (ref 4.6–6.5)

## 2014-10-02 MED ORDER — PRAVASTATIN SODIUM 40 MG PO TABS: 40.0000 mg | ORAL_TABLET | Freq: Every day | ORAL | Status: DC

## 2014-10-02 MED ORDER — LISINOPRIL 10 MG PO TABS: 10.0000 mg | ORAL_TABLET | Freq: Every day | ORAL | Status: DC

## 2014-10-02 NOTE — Progress Notes (Addendum)
HPI:  Here for CPE:  -Concerns and/or follow up today:   DM/Multinodular goiter/Hypothyroid: -sees Dr. Dwyane Dee for this, s/p RAI per her report for goiter, not on thyroid medicaiton -eye exam: done -diet and exercise: no regular exercise, diet is not good -Meds: asa, lisinopril, metformin 2000mg  daily, victoza -denies complications -thinks had low blood sugar once when did not eat and was working  HLD: -on pravastatin - ran out of cholesterol medication a few days ago -denies: no leg cramps or cog changes  HTN: -meds: lisinopril 10, ran out a few days ago -reports bp is good when on medication -denies: CP, SOB, DOE  Migraines: -chronic -seeing Dr. Domingo Cocking in neurology -had been trying shots -on zonisamide recently and baclofen prn  Depression: -On a medication in the past -now doing CBT with Richardo Priest -she denies hx self harm of SI  OSA: -she reports was seeing a specialist a very long time ago and was on CPAP > 10 years ago -wanted referral to see sleep doctor and retest; referral done 05/2014 -snores, excessive daytime somnelence, apneic  Hx iron def anemia when menstruated, s/p hysterectomy  -Diet: variety of foods, balance and well rounded, larger portion sizes  -Exercise: no regular exercise  -Taking folic acid, vitamin D or calcium: no  -Diabetes and Dyslipidemia Screening: doing today  -Vaccines: UTD  -pap history: s/p hysterectomy - does see doctor cousins  -FDLMP: n/a  -sexual activity: yes, female partner, no new partners  -wants STI testing (Hep C if born 70-65): no  -FH breast, colon or ovarian ca: see FH Last mammogram: she reports she will schedule her mammogram with [facility] Last colon cancer screening:  Breast Ca Risk Assessment: -SolutionApps.it  Genetic Counseling Screen: Http://www.breastcancergenesscreen.org/startScreen.aspx  FRAX (50-65):  DEXA (>/= 65):   -Alcohol, Tobacco, drug use: see social  history  Review of Systems - no fevers, unintentional weight loss, vision loss, hearing loss, chest pain, sob, hemoptysis, melena, hematochezia, hematuria, genital discharge, changing or concerning skin lesions, bleeding, bruising, loc, thoughts of self harm or SI  Past Medical History  Diagnosis Date  . Hypertension   . Diabetes mellitus without complication   . Hyperthyroidism   . Headache(784.0)     migraine  . Hyperlipidemia   . Depression   . Chicken pox     CHILDHOOD  . Sleep apnea     uses a cpap  . Multinodular goiter 12/28/2012    Past Surgical History  Procedure Laterality Date  . Abdominal hysterectomy    . Cholecystectomy    . Robotic assisted laparoscopic lysis of adhesion N/A 06/02/2012    Procedure: ROBOTIC ASSISTED LAPAROSCOPIC LYSIS OF ADHESION;  Surgeon: Marvene Staff, MD;  Location: [facility];  Service: Gynecology;  Laterality: N/A;  . Robotic assisted salpingo oopherectomy Left 06/02/2012    Procedure: ROBOTIC ASSISTED SALPINGO OOPHORECTOMY;  Surgeon: Marvene Staff, MD;  Location: [facility];  Service: Gynecology;  Laterality: Left;  pelvic washings  . Robot assisted myomectomy Left 06/02/2012    Procedure: ROBOTIC ASSISTED MYOMECTOMY;  Surgeon: Marvene Staff, MD;  Location: [facility];  Service: Gynecology;  Laterality: Left;  anexal mass is a myoma left side  . Ulnar nerve transposition Right 08/15/2013    Procedure: RIGHT ULNAR NERVE IN SITU DECOMPRESSION/POSSIBLE TRANSPOSITION;  Surgeon: Cammie Sickle., MD;  Location: [facility];  Service: Orthopedics;  Laterality: Right;    Family History  Problem Relation Age of Onset  . Arthritis Mother   . Arthritis  Father   . Hyperlipidemia Father   . Diabetes Father   . Hypertension Sister   . Mental illness Sister   . Diabetes Sister   . Diabetes Brother   . Cancer Paternal Aunt     BREAST    History   Social History  . Marital Status: Divorced    Spouse Name: N/A  . Number of  Children: N/A  . Years of Education: N/A   Social History Main Topics  . Smoking status: Never Smoker   . Smokeless tobacco: Not on file  . Alcohol Use: No  . Drug Use: No  . Sexual Activity: Not on file   Other Topics Concern  . 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  aspirin 81 MG tablet, Take 81 mg by mouth daily., Disp: , Rfl:  .  glucose blood (ONETOUCH VERIO) test strip, Use as instructed to check blood sugars 2 times per day dx code E11.9, Disp: 100 each, Rfl: 0 .  Insulin Pen Needle (B-D UF III MINI PEN NEEDLES) 31G X 5 MM MISC, Use one daily to inject victoza, Disp: 30 each, Rfl: 0 .  Liraglutide 18 MG/3ML SOPN, Inject 1.8 mg daily, Disp: 3 pen, Rfl: 0 .  lisinopril (PRINIVIL,ZESTRIL) 10 MG tablet, Take 1 tablet (10 mg total) by mouth daily., Disp: 90 tablet, Rfl: 5 .  metFORMIN (GLUCOPHAGE-XR) 500 MG 24 hr tablet, TAKE 4 TABLETS (2,000 MG TOTAL) BY MOUTH DAILY WITH SUPPER., Disp: 120 tablet, Rfl: 3 .  pravastatin (PRAVACHOL) 40 MG tablet, Take 1 tablet (40 mg total) by mouth daily., Disp: 90 tablet, Rfl: 3  EXAM:  Filed Vitals:   10/02/14 0817  BP: 122/82  Pulse: 74  Temp: 98.2 F (36.8 C)    GENERAL: vitals reviewed and listed below, alert, oriented, appears well hydrated and in no acute distress  HEENT: head atraumatic, PERRLA, normal appearance of eyes, ears, nose and mouth. moist mucus membranes.  NECK: supple, goiter  LUNGS: clear to auscultation bilaterally, no rales, rhonchi or wheeze  CV: HRRR, no peripheral edema or cyanosis, normal pedal pulses  BREAST: declined - sees gyn  ABDOMEN: bowel sounds normal, soft, non tender to palpation, no masses, no rebound or guarding  GU: declined - sees  gyn  SKIN: no rash or abnormal lesions  MS: normal gait, moves all extremities normally  NEURO: CN II-XII grossly intact, normal muscle strength and sensation to light touch on extremities  PSYCH: normal affect, pleasant and cooperative  FOOT EXAM: done  ASSESSMENT AND PLAN:  Discussed the following assessment and plan:  Essential hypertension - Plan: Basic metabolic panel  Type 2 diabetes mellitus without complication - Plan: Hemoglobin G9Q, Basic metabolic panel  HYPERCHOLESTEROLEMIA - Plan: Lipid Panel, pravastatin (PRAVACHOL) 40 MG tablet  Obesity  Visit for preventive health examination - Plan: HIV antibody (with reflex)  HYPERTENSION, BENIGN SYSTEMIC - Plan: lisinopril (PRINIVIL,ZESTRIL) 10 MG tablet    -Discussed and advised all [place] preventive services health task force level A and B recommendations for age, sex and risks.  -Advised at least 150 minutes of exercise per week and a healthy diet low in saturated fats and sweets and consisting  of fresh fruits and vegetables, lean meats such as fish and white chicken and whole grains.  -Foot exam done  -HIV screen offered  -labs (hgba1c, lipids, bmp), studies and vaccines per orders this encounter  Orders Placed This Encounter  Procedures  . Lipid Panel  . HIV antibody (with reflex)  . Hemoglobin A1c  . Basic metabolic panel    Patient advised to return to clinic immediately if symptoms worsen or persist or new concerns.  Patient Instructions  Before you leave: -labs -follow up in 6 months  Vit D3 [phone] IU   Please eat small regular healthy meals and check blood sugar if feeling like you are having low blood sugar - then notify your endocrinologist immediately  We recommend the following healthy lifestyle measures: - eat a healthy diet consisting of lots of vegetables, fruits, beans, nuts, seeds, healthy meats such as white chicken and fish and whole grains.  - avoid fried foods, fast food, processed  foods, sodas, red meet and other fattening foods.  - get a least 150 minutes of aerobic exercise per week.   Get you annual pap and breast exam with your gynecologist  Set up mammogram  Colon cancer screening due when you turn 9 - please call if you want to do the colonoscopy     No Follow-up on file.  Colin Benton R.

## 2014-10-02 NOTE — Patient Instructions (Addendum)
Before you leave: -labs -follow up in 6 months  Vit D3 [phone] IU   Please eat small regular healthy meals and check blood sugar if feeling like you are having low blood sugar - then notify your endocrinologist immediately  We recommend the following healthy lifestyle measures: - eat a healthy diet consisting of lots of vegetables, fruits, beans, nuts, seeds, healthy meats such as white chicken and fish and whole grains.  - avoid fried foods, fast food, processed foods, sodas, red meet and other fattening foods.  - get a least 150 minutes of aerobic exercise per week.   Get you annual pap and breast exam with your gynecologist  Set up mammogram  Colon cancer screening due when you turn 57 - please call if you want to do the colonoscopy

## 2014-10-02 NOTE — Progress Notes (Signed)
Pre visit review using our clinic review tool, if applicable. No additional management support is needed unless otherwise documented below in the visit note. 

## 2014-10-03 LAB — HIV ANTIBODY (ROUTINE TESTING W REFLEX): HIV 1&2 Ab, 4th Generation: NONREACTIVE

## 2014-10-10 ENCOUNTER — Other Ambulatory Visit: Payer: Self-pay | Admitting: *Deleted

## 2014-10-10 ENCOUNTER — Telehealth: Payer: Self-pay | Admitting: Endocrinology

## 2014-10-10 MED ORDER — LIRAGLUTIDE 18 MG/3ML ~~LOC~~ SOPN: PEN_INJECTOR | SUBCUTANEOUS | Status: DC

## 2014-10-10 NOTE — Telephone Encounter (Signed)
Pt needs victoza-she doesn't have the money but will on Friday if we can call in a rx to [facility]

## 2014-10-10 NOTE — Telephone Encounter (Signed)
rx sent

## 2014-11-14 ENCOUNTER — Encounter: Payer: Self-pay | Admitting: Endocrinology

## 2014-11-14 ENCOUNTER — Ambulatory Visit (INDEPENDENT_AMBULATORY_CARE_PROVIDER_SITE_OTHER): Payer: BLUE CROSS/BLUE SHIELD | Admitting: Endocrinology

## 2014-11-14 VITALS — BP 128/78 | HR 81 | Temp 98.5°F | Resp 12 | Wt 162.8 lb

## 2014-11-14 DIAGNOSIS — IMO0002 Reserved for concepts with insufficient information to code with codable children: Secondary | ICD-10-CM

## 2014-11-14 DIAGNOSIS — E1165 Type 2 diabetes mellitus with hyperglycemia: Secondary | ICD-10-CM | POA: Diagnosis not present

## 2014-11-14 DIAGNOSIS — E042 Nontoxic multinodular goiter: Secondary | ICD-10-CM | POA: Diagnosis not present

## 2014-11-14 MED ORDER — GLIMEPIRIDE 1 MG PO TABS: 1.0000 mg | ORAL_TABLET | Freq: Every day | ORAL | Status: DC

## 2014-11-14 NOTE — Progress Notes (Signed)
Patient ID: [name], female   DOB: 07/10/1965, 49 y.o.   MRN: [id number]   Reason for Appointment: Diabetes follow-up   History of Present Illness   Diagnosis: Type 2 DIABETES MELITUS  Recent history: Her blood sugars are usually well controlled with using 1.2 mg Victoza along with metformin. Previously was able to also lose some weight with Victoza and this was improving her satiety.  Her A1c had increased in 2/16 and she has not been seen in follow-up since then At that time she had started taking 1.8 mg of Victoza which was helping her satiety better. However she now says that she has been more nauseated recently and for the last week or so has taken only 1.2 mg Victoza Her A1c is only slightly better at 7.6 Did not bring her monitor for download and not clear what her blood sugar patterns are. Also her weight has gone up slightly Still not checking readings after meals as directed  Also has not been exercising as before     Oral hypoglycemic drugs: Metformin 2g  Side effects from medications:  Nausea with 1.8 mg Victoza        Monitors blood glucose: Once a day or less.    Glucometer: One Touch.          Blood Glucose readings from recall:  Mostly checking fasting readings , usually 130s; pc 160  Hypoglycemia frequency:  none        Meals: 2 meals per day. Largest meal 6:30 pm; occasionally goes off diet         Physical activity: exercise: is relatively active at work, not using home DVD for exercise, too tired Has not seen a dietitian in some time   Wt Readings from Last 3 Encounters:  11/14/14 162 lb 12.8 oz (73.846 kg)  10/02/14 159 lb 11.2 oz (72.439 kg)  06/14/14 159 lb 6.4 oz (72.303 kg)   Lab Results  Component Value Date   HGBA1C 7.6* 10/02/2014   HGBA1C 7.8* 05/28/2014   HGBA1C 7.4* 11/15/2013   Lab Results  Component Value Date   MICROALBUR 6.4* 05/02/2013   [place] 99 10/02/2014   CREATININE 0.82 10/02/2014       Medication List       This  list is accurate as of: 11/14/14 11:59 PM.  Always use your most recent med list.               aspirin 81 MG tablet  Take 81 mg by mouth daily.     glimepiride 1 MG tablet  Commonly known as:  AMARYL  Take 1 tablet (1 mg total) by mouth daily before supper.     glucose blood test strip  Commonly known as:  ONETOUCH VERIO  Use as instructed to check blood sugars 2 times per day dx code E11.9     Insulin Pen Needle 31G X 5 MM Misc  Commonly known as:  B-D UF III MINI PEN NEEDLES  Use one daily to inject victoza     Liraglutide 18 MG/3ML Sopn  Inject 1.8 mg daily     lisinopril 10 MG tablet  Commonly known as:  PRINIVIL,ZESTRIL  Take 1 tablet (10 mg total) by mouth daily.     metFORMIN 500 MG 24 hr tablet  Commonly known as:  GLUCOPHAGE-XR  TAKE 4 TABLETS (2,000 MG TOTAL) BY MOUTH DAILY WITH SUPPER.     pravastatin 40 MG tablet  Commonly known as:  PRAVACHOL  Take 1 tablet (  40 mg total) by mouth daily.        Allergies: No Known Allergies  Past Medical History  Diagnosis Date  . Hypertension   . Diabetes mellitus without complication   . Hyperthyroidism   . Headache(784.0)     migraine  . Hyperlipidemia   . Depression   . Chicken pox     CHILDHOOD  . Sleep apnea     uses a cpap  . Multinodular goiter 12/28/2012    Past Surgical History  Procedure Laterality Date  . Abdominal hysterectomy    . Cholecystectomy    . Robotic assisted laparoscopic lysis of adhesion N/A 06/02/2012    Procedure: ROBOTIC ASSISTED LAPAROSCOPIC LYSIS OF ADHESION;  Surgeon: Marvene Staff, MD;  Location: [facility];  Service: Gynecology;  Laterality: N/A;  . Robotic assisted salpingo oopherectomy Left 06/02/2012    Procedure: ROBOTIC ASSISTED SALPINGO OOPHORECTOMY;  Surgeon: Marvene Staff, MD;  Location: [facility];  Service: Gynecology;  Laterality: Left;  pelvic washings  . Robot assisted myomectomy Left 06/02/2012    Procedure: ROBOTIC ASSISTED MYOMECTOMY;  Surgeon: Marvene Staff, MD;  Location: [facility];  Service: Gynecology;  Laterality: Left;  anexal mass is a myoma left side  . Ulnar nerve transposition Right 08/15/2013    Procedure: RIGHT ULNAR NERVE IN SITU DECOMPRESSION/POSSIBLE TRANSPOSITION;  Surgeon: Cammie Sickle., MD;  Location: [facility];  Service: Orthopedics;  Laterality: Right;    Family History  Problem Relation Age of Onset  . Arthritis Mother   . Arthritis Father   . Hyperlipidemia Father   . Diabetes Father   . Hypertension Sister   . Mental illness Sister   . Diabetes Sister   . Diabetes Brother   . Cancer Paternal Aunt     BREAST    Social History:  reports that she has never smoked. She does not have any smokeless tobacco history on file. She reports that she does not drink alcohol or use illicit drugs.  Review of Systems:   Hypertension:  she is on lisinopril 10 mg alone though for hypertension with good control  Lipids: Has been on pravastatin for quite some time and her LDL is just below 100  Lab Results  Component Value Date   CHOL 173 10/02/2014   HDL 57.50 10/02/2014   LDLCALC 99 10/02/2014   LDLDIRECT 144* 04/02/2006   TRIG 85.0 10/02/2014   CHOLHDL 3 10/02/2014       She has a long-standing history of euthyroid multinodular goiter. No local discomfort, pressure sensation or difficulty swallowing No recent labs available  Lab Results  Component Value Date   TSH 0.49 11/15/2013      Examination:   BP 128/78 mmHg  Pulse 81  Temp(Src) 98.5 F (36.9 C) (Oral)  Resp 12  Wt 162 lb 12.8 oz (73.846 kg)  SpO2 97%  Body mass index is 27.73 kg/(m^2).   No pedal edema  She has a very large diffuse goiter, about 4 times normal on the right and 3 times on the left, soft to firm Neck circumference is 39.5  ASSESSMENT/ PLAN:  Diabetes type 2   Blood glucose control appears to be only fair with A1c 7.6 in July However she reports fairly good blood sugars at home although did not bring  her monitor for download today  She does not think she can tolerate 1.8 mg Victoza because of nausea and wants to continue 1.2 mg Since her A1c is high and  likely has high readings after supper and overnight will start her on 0.5 mg Amaryl at dinnertime in addition to her metformin  Requested her to work harder on diet and exercise and needs to start some kind of exercise program at least at home She is still reluctant to see the dietitian but may consider it again on the next visit She will need to bring her monitor for download on each visit  HYPERTENSION: Her blood pressure is well-controlled   MULTINODULAR goiter: Clinically stable and will check TSH today    Patient Instructions  Check blood sugars on waking up .Marland Kitchen2-3  .Marland Kitchen times a week Also check blood sugars about 2 hours after a meal and do this after different meals by rotation  Recommended blood sugar levels on waking up is 90-130 and about 2 hours after meal is 140-180 Please bring blood sugar monitor to each visit.  Glimeperide 1/2 pill with supper  DVD exercise 3 days a week     [name] 11/15/2014, 1:45 PM   No visits with results within 1 Week(s) from this visit. Latest known visit with results is:  Office Visit on 10/02/2014  Component Date Value Ref Range Status  . Cholesterol 10/02/2014 173  0 - 200 mg/dL Final   ATP III Classification       Desirable:  < 200 mg/dL               Borderline High:  200 - 239 mg/dL          High:  > = 240 mg/dL  . Triglycerides 10/02/2014 85.0  0.0 - 149.0 mg/dL Final   Normal:  <150 mg/dLBorderline High:  150 - 199 mg/dL  . HDL 10/02/2014 57.50  >39.00 mg/dL Final  . VLDL 10/02/2014 17.0  0.0 - 40.0 mg/dL Final  . LDL Cholesterol 10/02/2014 99  0 - 99 mg/dL Final  . Total CHOL/HDL Ratio 10/02/2014 3   Final                  Men          Women1/2 Average Risk     3.4          3.3Average Risk          5.0          4.42X Average Risk          9.6          7.13X Average Risk           15.0          11.0                      . NonHDL 10/02/2014 115.50   Final   NOTE:  Non-HDL goal should be 30 mg/dL higher than patient's LDL goal (i.e. LDL goal of < 70 mg/dL, would have non-HDL goal of < 100 mg/dL)  . HIV 1&2 Ab, 4th Generation 10/02/2014 NONREACTIVE  NONREACTIVE Final   Comment:   HIV-1 antigen and HIV-1/HIV-2 antibodies were not detected.  There is no laboratory evidence of HIV infection.   HIV-1/2 Antibody Diff        Not indicated. HIV-1 RNA, Qual TMA          Not indicated.     PLEASE NOTE: This information has been disclosed to you from records whose confidentiality may be protected by state law. If your state requires such protection, then the state law prohibits you  from making any further disclosure of the information without the specific written consent of the person to whom it pertains, or as otherwise permitted by law. A general authorization for the release of medical or other information is NOT sufficient for this purpose.   The performance of this assay has not been clinically validated in patients less than 60 years old.   For additional information please refer to http://education.[employer].com/faq/FAQ106.  (This link is being provided for informational/educational purposes only.)     . Hgb A1c MFr Bld 10/02/2014 7.6* 4.6 - 6.5 % Final   Glycemic Control Guidelines for People with Diabetes:Non Diabetic:  <6%Goal of Therapy: <7%Additional Action Suggested:  >8%   . Sodium 10/02/2014 142  135 - 145 mEq/L Final  . Potassium 10/02/2014 4.6  3.5 - 5.1 mEq/L Final  . Chloride 10/02/2014 105  96 - 112 mEq/L Final  . CO2 10/02/2014 28  19 - 32 mEq/L Final  . Glucose, Bld 10/02/2014 148* 70 - 99 mg/dL Final  . BUN 10/02/2014 13  6 - 23 mg/dL Final  . Creatinine, Ser 10/02/2014 0.82  0.40 - 1.20 mg/dL Final  . Calcium 10/02/2014 9.8  8.4 - 10.5 mg/dL Final  . GFR 10/02/2014 95.05  >60.00 mL/min Final

## 2014-11-14 NOTE — Patient Instructions (Addendum)
Check blood sugars on waking up .Marland Kitchen2-3  .Marland Kitchen times a week Also check blood sugars about 2 hours after a meal and do this after different meals by rotation  Recommended blood sugar levels on waking up is 90-130 and about 2 hours after meal is 140-180 Please bring blood sugar monitor to each visit.  Glimeperide 1/2 pill with supper  DVD exercise 3 days a week

## 2014-11-30 ENCOUNTER — Encounter: Payer: Self-pay | Admitting: Endocrinology

## 2014-11-30 ENCOUNTER — Encounter: Payer: Self-pay | Admitting: Dietician

## 2014-11-30 ENCOUNTER — Encounter: Payer: BLUE CROSS/BLUE SHIELD | Attending: Endocrinology | Admitting: Dietician

## 2014-11-30 VITALS — Ht 63.0 in | Wt 162.0 lb

## 2014-11-30 DIAGNOSIS — E119 Type 2 diabetes mellitus without complications: Secondary | ICD-10-CM | POA: Insufficient documentation

## 2014-11-30 DIAGNOSIS — Z713 Dietary counseling and surveillance: Secondary | ICD-10-CM | POA: Insufficient documentation

## 2014-11-30 DIAGNOSIS — Z794 Long term (current) use of insulin: Secondary | ICD-10-CM | POA: Insufficient documentation

## 2014-11-30 NOTE — Patient Instructions (Signed)
Begin eating breakfast. Continue to bake, broil, grill, rather than frying. Consistent carbohydrate at each meal. Aim for 2-3 Carb Choices per meal (30-45 grams) +/- 1 either way  Aim for 0-1 Carbs per snack if hungry  Include protein in moderation with your meals and snacks Consider  increasing your activity level by walking for 15 minutes daily as tolerated Consider checking BG at alternate times per day as directed by MD  Consider taking medication as directed by MD

## 2014-11-30 NOTE — Progress Notes (Signed)
Diabetes Self-Management Education  Visit Type: First/Initial  Appt. Start Time: 1400 Appt. End Time: 9562  11/30/2014  [name], identified by name and date of birth, is a 49 y.o. female with a diagnosis of Diabetes: Type 2.   Patient is here alone.  She wishes to learn more about nutrition to better control her blood sugar.  At this time she has a severe headache that she reports having for the past 4 days.  Her blood pressure was 148/94 today.  She goes to a headache clinic.  She reported having run out of her cholesterol medication and her blood pressure medication for the past 2 weeks due to finances.  She just refilled these.  She reports not always having enough money for food.  She scored a 19 on the depression screening scale today.  She states that she sees Richardo Priest and will return to see her when she has the finances. Her stressors include the death of her 60 year old grand daughter 2 years ago and job stress.  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  Hx includes type 2 diabetes for the past 10-12 years, Obstructive Sleep Apnea (she has a c-pap but no longer remembers how to use this).  She has lost 30 lbs since being diagnosed.  Hx also includes hyperlipidemia, HTN, and goiter.  She walks a lot at work but has no motivation for other exercise.  Does not feel like going out of the house.  She is checking her blood sugar once a day if she remembers.  Had checked it before and after meal in the past.  CBG's have been WNL on most checks.    Poor appetite since 2015 and poor intake currently.  Takes metformin XR with fruit.  Recommended taking this with additional food.    She lives with her fiance.  He is helpful and has tried to get her to walk but she has not felt well.  Also complains of increasing muscle aches.  Patient does the cooking and shopping.    ASSESSMENT  Height 5\' 3"  (1.6 m), weight 162 lb (73.483 kg). Body mass index is 28.7 kg/(m^2).    Breakfast:  Apple or fruit cup Snack:  none Lunch:  1/2 of a 6" sub Snack:  NABS or small bag popcorn or ice cream (until recent MD visit). Dinner:  Chicken, potato, sweet potato, or rice with vegetables Snack:  None Beverages:  Water, occasional 1/2 cup gingerale (regular) when nauseous.        Diabetes Self-Management Education - 11/30/14 1628    Patient Education   Nutrition management  Role of diet in the treatment of diabetes and the relationship between the three main macronutrients and blood glucose level;Food label reading, portion sizes and measuring food.;Carbohydrate counting;Meal timing in regards to the patients' current diabetes medication.;Reviewed blood glucose goals for pre and post meals and how to evaluate the patients' food intake on their blood glucose level.   Physical activity and exercise  Role of exercise on diabetes management, blood pressure control and cardiac health.   Medications Reviewed patients medication for diabetes, action, purpose, timing of dose and side effects.   Monitoring Purpose and frequency of SMBG.;Identified appropriate SMBG and/or A1C goals.;Daily foot exams;Yearly dilated eye exam   Psychosocial adjustment Worked with patient to identify barriers to care and solutions;Identified and addressed patients feelings and concerns about diabetes   Personal strategies to promote health Lifestyle issues that need to be addressed for  better diabetes care   Individualized Goals (developed by patient)   Nutrition Follow meal plan discussed;General guidelines for healthy choices and portions discussed   Physical Activity Exercise 3-5 times per week;30 minutes per day   Medications take my medication as prescribed   Reducing Risk examine blood glucose patterns;do foot checks daily;Other (comment)  regularly scheduled meals with consistent carboydrate   Health Coping ask for help with (comment)  depression   Outcomes   Expected Outcomes Other (comment)   Demonstrated interest in learning but with verbalized memory problems and depression   Future DMSE 2 months   Program Status Completed      Individualized Plan for Diabetes Self-Management Training:   Learning Objective:  Patient will have a greater understanding of diabetes self-management. Patient education plan is to attend individual and/or group sessions per assessed needs and concerns.   Plan:   Patient Instructions  Begin eating breakfast. Continue to bake, broil, grill, rather than frying. Consistent carbohydrate at each meal. Aim for 2-3 Carb Choices per meal (30-45 grams) +/- 1 either way  Aim for 0-1 Carbs per snack if hungry  Include protein in moderation with your meals and snacks Consider  increasing your activity level by walking for 15 minutes daily as tolerated Consider checking BG at alternate times per day as directed by MD  Consider taking medication as directed by MD       Expected Outcomes:  Other (comment) (Demonstrated interest in learning but with verbalized memory problems and depression)  Education material provided: Living Well with Diabetes, Food label handouts, Meal plan card and Snack sheet, breakfast ideas, food insecurity resources, glucerna handout and samples  If problems or questions, patient to contact team via:  Phone and Email  Future DSME appointment: 2 months

## 2015-01-09 ENCOUNTER — Other Ambulatory Visit: Payer: Self-pay | Admitting: Endocrinology

## 2015-01-29 ENCOUNTER — Encounter: Payer: Self-pay | Admitting: *Deleted

## 2015-01-29 ENCOUNTER — Ambulatory Visit (INDEPENDENT_AMBULATORY_CARE_PROVIDER_SITE_OTHER): Payer: BLUE CROSS/BLUE SHIELD | Admitting: Family Medicine

## 2015-01-29 ENCOUNTER — Encounter: Payer: Self-pay | Admitting: Family Medicine

## 2015-01-29 VITALS — BP 120/82 | HR 93 | Temp 98.5°F | Ht 63.0 in | Wt 157.4 lb

## 2015-01-29 DIAGNOSIS — M79644 Pain in right finger(s): Secondary | ICD-10-CM | POA: Diagnosis not present

## 2015-01-29 DIAGNOSIS — E119 Type 2 diabetes mellitus without complications: Secondary | ICD-10-CM

## 2015-01-29 DIAGNOSIS — M25512 Pain in left shoulder: Secondary | ICD-10-CM

## 2015-01-29 DIAGNOSIS — L84 Corns and callosities: Secondary | ICD-10-CM | POA: Diagnosis not present

## 2015-01-29 MED ORDER — NAPROXEN 250 MG PO TABS: 250.0000 mg | ORAL_TABLET | Freq: Two times a day (BID) | ORAL | Status: DC

## 2015-01-29 NOTE — Patient Instructions (Signed)
-  thumb splint for 2-4 weeks to encourage rest of this finger  -naproxen low dose twice daily for 1 week  -follow up in 4 week or as needed if worsens or if persists       Trigger Finger Trigger finger (digital tendinitis and stenosing tenosynovitis) is a common disorder that causes an often painful catching of the fingers or thumb. It occurs as a clicking, snapping, or locking of a finger in the palm of the hand. This is caused by a problem with the tendons that flex or bend the fingers sliding smoothly through their sheaths. The condition may occur in any finger or a couple fingers at the same time.  The finger may lock with the finger curled or suddenly straighten out with a snap. This is more common in patients with rheumatoid arthritis and diabetes. Left untreated, the condition may get worse to the point where the finger becomes locked in flexion, like making a fist, or less commonly locked with the finger straightened out. CAUSES   Inflammation and scarring that lead to swelling around the tendon sheath.  Repeated or forceful movements.  Rheumatoid arthritis, an autoimmune disease that affects joints.  Gout.  Diabetes mellitus. SIGNS AND SYMPTOMS  Soreness and swelling of your finger.  A painful clicking or snapping as you bend and straighten your finger. DIAGNOSIS  Your health care provider will do a physical exam of your finger to diagnose trigger finger. TREATMENT   Splinting for 6-8 weeks may be helpful.  Nonsteroidal anti-inflammatory medicines (NSAIDs) can help to relieve the pain and inflammation.  Cortisone injections, along with splinting, may speed up recovery. Several injections may be required. Cortisone may give relief after one injection.  Surgery is another treatment that may be used if conservative treatments do not work. Surgery can be minor, without incisions (a cut does not have to be made), and can be done with a needle through the skin.  Other  surgical choices involve an open procedure in which the surgeon opens the hand through a small incision and cuts the pulley so the tendon can again slide smoothly. Your hand will still work fine. HOME CARE INSTRUCTIONS  Apply ice to the injured area, twice per day:  Put ice in a plastic bag.  Place a towel between your skin and the bag.  Leave the ice on for 20 minutes, 3-4 times a day.  Rest your hand often. MAKE SURE YOU:   Understand these instructions.  Will watch your condition.  Will get help right away if you are not doing well or get worse.   This information is not intended to replace advice given to you by your health care provider. Make sure you discuss any questions you have with your health care provider.   Document Released: 01/04/2004 Document Revised: 11/16/2012 Document Reviewed: 08/16/2012 Elsevier Interactive Patient Education [employer].

## 2015-01-29 NOTE — Progress Notes (Signed)
HPI:  R thumb pain: -started 3 weeks ago -pain and occ catching of thumb where can't extend fully -denies swelling, redness, weakness, numbness, fevers, malaise   ROS: See pertinent positives and negatives per HPI.  Past Medical History  Diagnosis Date  . Hypertension   . Diabetes mellitus without complication ([place])   . Hyperthyroidism   . Headache(784.0)     migraine  . Hyperlipidemia   . Depression   . Chicken pox     CHILDHOOD  . Sleep apnea     uses a cpap  . Multinodular goiter 12/28/2012    Past Surgical History  Procedure Laterality Date  . Abdominal hysterectomy    . Cholecystectomy    . Robotic assisted laparoscopic lysis of adhesion N/A 06/02/2012    Procedure: ROBOTIC ASSISTED LAPAROSCOPIC LYSIS OF ADHESION;  Surgeon: Marvene Staff, MD;  Location: [facility];  Service: Gynecology;  Laterality: N/A;  . Robotic assisted salpingo oopherectomy Left 06/02/2012    Procedure: ROBOTIC ASSISTED SALPINGO OOPHORECTOMY;  Surgeon: Marvene Staff, MD;  Location: [facility];  Service: Gynecology;  Laterality: Left;  pelvic washings  . Robot assisted myomectomy Left 06/02/2012    Procedure: ROBOTIC ASSISTED MYOMECTOMY;  Surgeon: Marvene Staff, MD;  Location: [facility];  Service: Gynecology;  Laterality: Left;  anexal mass is a myoma left side  . Ulnar nerve transposition Right 08/15/2013    Procedure: RIGHT ULNAR NERVE IN SITU DECOMPRESSION/POSSIBLE TRANSPOSITION;  Surgeon: Cammie Sickle., MD;  Location: [facility];  Service: Orthopedics;  Laterality: Right;    Family History  Problem Relation Age of Onset  . Arthritis Mother   . Arthritis Father   . Hyperlipidemia Father   . Diabetes Father   . Hypertension Sister   . Mental illness Sister   . Diabetes Sister   . Diabetes Brother   . Cancer Paternal Aunt     BREAST    Social History   Social History  . Marital Status: Divorced    Spouse Name: N/A  . Number of Children: N/A  . Years of  Education: N/A   Social History Main Topics  . Smoking status: Never Smoker   . Smokeless tobacco: None  . Alcohol Use: No  . Drug Use: No  . Sexual Activity: Not Asked   Other Topics Concern  . 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  aspirin 81 MG tablet, Take 81 mg by mouth daily., Disp: , Rfl:  .  glimepiride (AMARYL) 1 MG tablet, Take 1 tablet (1 mg total) by mouth daily before supper., Disp: 30 tablet, Rfl: 2 .  glucose blood (ONETOUCH VERIO) test strip, Use as instructed to check blood sugars 2 times per day dx code E11.9, Disp: 100 each, Rfl: 0 .  Insulin Pen Needle (B-D UF III MINI PEN NEEDLES) 31G X 5 MM MISC, Use one daily to inject victoza, Disp: 30 each, Rfl: 0 .  Liraglutide 18 MG/3ML SOPN, Inject 1.8 mg daily, Disp: 3 pen, Rfl: 0 .  lisinopril (PRINIVIL,ZESTRIL) 10 MG tablet, Take 1 tablet (10 mg total) by mouth daily., Disp: 90 tablet, Rfl: 5 .  metFORMIN (GLUCOPHAGE-XR) 500 MG  24 hr tablet, TAKE 4 TABLETS BY MOUTH DAILY WITH SUPPER., Disp: 120 tablet, Rfl: 3 .  pravastatin (PRAVACHOL) 40 MG tablet, Take 1 tablet (40 mg total) by mouth daily., Disp: 90 tablet, Rfl: 3 .  naproxen (NAPROSYN) 250 MG tablet, Take 1 tablet (250 mg total) by mouth 2 (two) times daily with a meal., Disp: 14 tablet, Rfl: 0  EXAM:  Filed Vitals:   01/29/15 1256  BP: 120/82  Pulse: 93  Temp: 98.5 F (36.9 C)    Body mass index is 27.89 kg/(m^2).  GENERAL: vitals reviewed and listed above, alert, oriented, appears well hydrated and in no acute distress  HEENT: atraumatic, conjunttiva clear, no obvious abnormalities on inspection of external nose and ears  NECK: no obvious masses on inspection  LUNGS: clear to auscultation bilaterally,  no wheezes, rales or rhonchi, good air movement  CV: HRRR, no peripheral edema  MS: moves all extremities without noticeable abnormality, TTP over small flexor tendon nodule R thumb, no catching on exam, no swelling or erythema. Normal strength, sensation to light touch and cap refill R hand and arm. See below for other exam.  PSYCH: pleasant and cooperative, no obvious depression or anxiety  ASSESSMENT AND PLAN:  Discussed the following assessment and plan:  Thumb pain, right  Shoulder pain, acute, left  Callus of foot - Plan: Ambulatory referral to Podiatry  Type 2 diabetes mellitus without complication, without long-term current use of insulin ([place]) - Plan: Ambulatory referral to Podiatry  -thumb splint to for short term immobilization for likely trigger finger along with low dose NSAID after discussion risks -follow up 4 weeks  Addendum: after visit pt complained of L shoulder pain from using that arm more due to thumb pain and request to be off from work. TTP in L RTC, no weakness. RTC HEP, advise no lifting of > 30lbs especially for 1 week with call to follow up at that time. She the decided did not want a work note. NSAID per orders, follow up 4 weeks. She also requested referral to podiatry for callus on R heal - no signs of infection or cracking on exam, small callus R heal. Referral placed. -Patient advised to return or notify a doctor immediately if symptoms worsen or persist or new concerns arise.  Patient Instructions  -thumb splint for 2-4 weeks to encourage rest of this finger  -naproxen low dose twice daily for 1 week  -follow up in 4 week or as needed if worsens or if persists       Trigger Finger Trigger finger (digital tendinitis and stenosing tenosynovitis) is a common disorder that causes an often painful catching of the fingers or thumb. It occurs as a clicking, snapping, or locking of a finger in the palm of the hand. This is caused by a problem with the  tendons that flex or bend the fingers sliding smoothly through their sheaths. The condition may occur in any finger or a couple fingers at the same time.  The finger may lock with the finger curled or suddenly straighten out with a snap. This is more common in patients with rheumatoid arthritis and diabetes. Left untreated, the condition may get worse to the point where the finger becomes locked in flexion, like making a fist, or less commonly locked with the finger straightened out. CAUSES   Inflammation and scarring that lead to swelling around the tendon sheath.  Repeated or forceful movements.  Rheumatoid arthritis, an autoimmune disease that affects joints.  Gout.  Diabetes mellitus. SIGNS AND SYMPTOMS  Soreness and swelling of your finger.  A painful clicking or snapping as you bend and straighten your finger. DIAGNOSIS  Your health care provider will do a physical exam of your finger to diagnose trigger finger. TREATMENT   Splinting for 6-8 weeks may be helpful.  Nonsteroidal anti-inflammatory medicines (NSAIDs) can help to relieve the pain and inflammation.  Cortisone injections, along with splinting, may speed up recovery. Several injections may be required. Cortisone may give relief after one injection.  Surgery is another treatment that may be used if conservative treatments do not work. Surgery can be minor, without incisions (a cut does not have to be made), and can be done with a needle through the skin.  Other surgical choices involve an open procedure in which the surgeon opens the hand through a small incision and cuts the pulley so the tendon can again slide smoothly. Your hand will still work fine. HOME CARE INSTRUCTIONS  Apply ice to the injured area, twice per day:  Put ice in a plastic bag.  Place a towel between your skin and the bag.  Leave the ice on for 20 minutes, 3-4 times a day.  Rest your hand often. MAKE SURE YOU:   Understand these  instructions.  Will watch your condition.  Will get help right away if you are not doing well or get worse.   This information is not intended to replace advice given to you by your health care provider. Make sure you discuss any questions you have with your health care provider.   Document Released: 01/04/2004 Document Revised: 11/16/2012 Document Reviewed: 08/16/2012 Elsevier Interactive Patient Education 2016 [place], [place]

## 2015-01-29 NOTE — Progress Notes (Signed)
Pre visit review using our clinic review tool, if applicable. No additional management support is needed unless otherwise documented below in the visit note. 

## 2015-02-04 ENCOUNTER — Ambulatory Visit: Payer: BLUE CROSS/BLUE SHIELD | Admitting: Family Medicine

## 2015-02-11 ENCOUNTER — Other Ambulatory Visit (INDEPENDENT_AMBULATORY_CARE_PROVIDER_SITE_OTHER): Payer: BLUE CROSS/BLUE SHIELD

## 2015-02-11 ENCOUNTER — Encounter: Payer: Self-pay | Admitting: Endocrinology

## 2015-02-11 DIAGNOSIS — IMO0002 Reserved for concepts with insufficient information to code with codable children: Secondary | ICD-10-CM

## 2015-02-11 DIAGNOSIS — E1165 Type 2 diabetes mellitus with hyperglycemia: Secondary | ICD-10-CM | POA: Diagnosis not present

## 2015-02-11 DIAGNOSIS — E042 Nontoxic multinodular goiter: Secondary | ICD-10-CM

## 2015-02-11 LAB — BASIC METABOLIC PANEL
BUN: 9 mg/dL (ref 6–23)
CO2: 27 mEq/L (ref 19–32)
Calcium: 9.3 mg/dL (ref 8.4–10.5)
Chloride: 108 mEq/L (ref 96–112)
Creatinine, Ser: 0.75 mg/dL (ref 0.40–1.20)
GFR: 105.2 mL/min (ref >60.00)
Glucose, Bld: 111 mg/dL — ABNORMAL HIGH (ref 70–99)
Potassium: 3.9 mEq/L (ref 3.5–5.1)
Sodium: 142 mEq/L (ref 135–145)

## 2015-02-11 LAB — URINALYSIS, ROUTINE W REFLEX MICROSCOPIC
Bilirubin Urine: NEGATIVE
Ketones, ur: NEGATIVE
Leukocytes, UA: NEGATIVE
Nitrite: NEGATIVE
Specific Gravity, Urine: 1.025 (ref 1.000–1.030)
Total Protein, Urine: NEGATIVE
Urine Glucose: NEGATIVE
Urobilinogen, UA: 0.2 (ref 0.0–1.0)
pH: 5.5 (ref 5.0–8.0)

## 2015-02-11 LAB — COMPREHENSIVE METABOLIC PANEL
ALT: 9 U/L (ref 0–35)
AST: 13 U/L (ref 0–37)
Albumin: 3.8 g/dL (ref 3.5–5.2)
Alkaline Phosphatase: 58 U/L (ref 39–117)
BUN: 9 mg/dL (ref 6–23)
CO2: 27 mEq/L (ref 19–32)
Calcium: 9.3 mg/dL (ref 8.4–10.5)
Chloride: 108 mEq/L (ref 96–112)
Creatinine, Ser: 0.75 mg/dL (ref 0.40–1.20)
GFR: 105.2 mL/min (ref >60.00)
Glucose, Bld: 111 mg/dL — ABNORMAL HIGH (ref 70–99)
Potassium: 3.9 mEq/L (ref 3.5–5.1)
Sodium: 142 mEq/L (ref 135–145)
Total Bilirubin: 0.3 mg/dL (ref 0.2–1.2)
Total Protein: 6.8 g/dL (ref 6.0–8.3)

## 2015-02-11 LAB — T4, FREE: Free T4: 0.85 ng/dL (ref 0.60–1.60)

## 2015-02-11 LAB — HEMOGLOBIN A1C: Hgb A1c MFr Bld: 7.2 % — ABNORMAL HIGH (ref 4.6–6.5)

## 2015-02-11 LAB — TSH: TSH: 0.65 u[IU]/mL (ref 0.35–4.50)

## 2015-02-14 ENCOUNTER — Encounter: Payer: BLUE CROSS/BLUE SHIELD | Attending: Family Medicine | Admitting: Dietician

## 2015-02-14 ENCOUNTER — Ambulatory Visit (INDEPENDENT_AMBULATORY_CARE_PROVIDER_SITE_OTHER): Payer: BLUE CROSS/BLUE SHIELD | Admitting: Endocrinology

## 2015-02-14 ENCOUNTER — Encounter: Payer: Self-pay | Admitting: Endocrinology

## 2015-02-14 ENCOUNTER — Encounter: Payer: Self-pay | Admitting: Dietician

## 2015-02-14 VITALS — BP 110/78 | HR 84 | Temp 97.7°F | Resp 14 | Ht 63.0 in | Wt 157.6 lb

## 2015-02-14 VITALS — Wt 157.0 lb

## 2015-02-14 DIAGNOSIS — E119 Type 2 diabetes mellitus without complications: Secondary | ICD-10-CM

## 2015-02-14 DIAGNOSIS — Z713 Dietary counseling and surveillance: Secondary | ICD-10-CM | POA: Insufficient documentation

## 2015-02-14 DIAGNOSIS — E1165 Type 2 diabetes mellitus with hyperglycemia: Secondary | ICD-10-CM | POA: Diagnosis present

## 2015-02-14 DIAGNOSIS — E669 Obesity, unspecified: Secondary | ICD-10-CM

## 2015-02-14 NOTE — Patient Instructions (Signed)
Consider joining a gym.  Try to get some form of exercise most days. Breakfast, lunch, and dinner daily.  If unable to eat use Glucerna or Boost Glucose control.  Aim for 2-3 Carb Choices per meal (30-45 grams) +/- 1 either way  Aim for 0-1 Carbs per snack if hungry  Include protein in moderation with your meals and snacks Consider reading food labels for Total Carbohydrate and Fat Grams of foods Consider checking BG at alternate times per day as directed by MD  Consider taking medication as directed by MD

## 2015-02-14 NOTE — Progress Notes (Signed)
Patient ID: [name], female   DOB: September 19, 1965, 49 y.o.   MRN: [id number]   Reason for Appointment: Diabetes follow-up   History of Present Illness   Diagnosis: Type 2 DIABETES MELITUS  Recent history: Her blood sugars are usually well controlled with using 1.2 mg Victoza along with metformin. Previously was able to also lose some weight with Victoza and this was improving her satiety.  Her A1c had increased in 2/16 to 7.8 but it is not getting progressively improved  Current blood sugar patterns, management and problems identified:  Because of her relatively high A1c in mild increase in blood sugars after dinner and fasting she was started on Amaryl 1 mg, half tablet in the evenings in 8/16  Her A1c appears to be better  She has however not been able to check her sugar much because of left shoulder and right hand pain.  Her lab glucose is fairly good fasting  No hypoglycemia with Amaryl.  She is starting to improve her diet with cutting back on fat intake  Also is starting to do a little walking and appears more motivated     Oral hypoglycemic drugs: Metformin 2g. Amaryl 0.5 mg at supper  Side effects from medications:  Nausea with 1.8 mg Victoza        Monitors blood glucose: Once a day or less.    Glucometer: One Touch.          Blood Glucose readings at home: Has only 2 readings recently yesterday of fasting 134 and PC breakfast of 128  Hypoglycemia frequency:  none        Meals: 2 meals per day. Largest meal 6:30 pm; less red meat and fried, more water       Physical activity: exercise: is relatively active at work, reportedly starting to walk a little  Has not seen a dietitian in some time   Wt Readings from Last 3 Encounters:  02/14/15 157 lb (71.215 kg)  02/14/15 157 lb 9.6 oz (71.487 kg)  01/29/15 157 lb 6.4 oz (71.396 kg)   Lab Results  Component Value Date   HGBA1C 7.2* 02/11/2015   HGBA1C 7.6* 10/02/2014   HGBA1C 7.8* 05/28/2014   Lab  Results  Component Value Date   MICROALBUR 6.4* 05/02/2013   LDLCALC 99 10/02/2014   CREATININE 0.75 02/11/2015   CREATININE 0.75 02/11/2015       Medication List       This list is accurate as of: 02/14/15  4:01 PM.  Always use your most recent med list.               aspirin 81 MG tablet  Take 81 mg by mouth daily.     baclofen 10 MG tablet  Commonly known as:  LIORESAL     glimepiride 1 MG tablet  Commonly known as:  AMARYL  Take 1 tablet (1 mg total) by mouth daily before supper.     glucose blood test strip  Commonly known as:  ONETOUCH VERIO  Use as instructed to check blood sugars 2 times per day dx code E11.9     Insulin Pen Needle 31G X 5 MM Misc  Commonly known as:  B-D UF III MINI PEN NEEDLES  Use one daily to inject victoza     Liraglutide 18 MG/3ML Sopn  Inject 1.8 mg daily     lisinopril 10 MG tablet  Commonly known as:  PRINIVIL,ZESTRIL  Take 1 tablet (10 mg total) by mouth  daily.     metFORMIN 500 MG 24 hr tablet  Commonly known as:  GLUCOPHAGE-XR  TAKE 4 TABLETS BY MOUTH DAILY WITH SUPPER.     naproxen 250 MG tablet  Commonly known as:  NAPROSYN  Take 1 tablet (250 mg total) by mouth 2 (two) times daily with a meal.     pravastatin 40 MG tablet  Commonly known as:  PRAVACHOL  Take 1 tablet (40 mg total) by mouth daily.        Allergies: No Known Allergies  Past Medical History  Diagnosis Date  . Hypertension   . Diabetes mellitus without complication ([place])   . Hyperthyroidism   . Headache(784.0)     migraine  . Hyperlipidemia   . Depression   . Chicken pox     CHILDHOOD  . Sleep apnea     uses a cpap  . Multinodular goiter 12/28/2012    Past Surgical History  Procedure Laterality Date  . Abdominal hysterectomy    . Cholecystectomy    . Robotic assisted laparoscopic lysis of adhesion N/A 06/02/2012    Procedure: ROBOTIC ASSISTED LAPAROSCOPIC LYSIS OF ADHESION;  Surgeon: Marvene Staff, MD;  Location: [facility];   Service: Gynecology;  Laterality: N/A;  . Robotic assisted salpingo oopherectomy Left 06/02/2012    Procedure: ROBOTIC ASSISTED SALPINGO OOPHORECTOMY;  Surgeon: Marvene Staff, MD;  Location: [facility];  Service: Gynecology;  Laterality: Left;  pelvic washings  . Robot assisted myomectomy Left 06/02/2012    Procedure: ROBOTIC ASSISTED MYOMECTOMY;  Surgeon: Marvene Staff, MD;  Location: [facility];  Service: Gynecology;  Laterality: Left;  anexal mass is a myoma left side  . Ulnar nerve transposition Right 08/15/2013    Procedure: RIGHT ULNAR NERVE IN SITU DECOMPRESSION/POSSIBLE TRANSPOSITION;  Surgeon: Cammie Sickle., MD;  Location: [facility];  Service: Orthopedics;  Laterality: Right;    Family History  Problem Relation Age of Onset  . Arthritis Mother   . Arthritis Father   . Hyperlipidemia Father   . Diabetes Father   . Hypertension Sister   . Mental illness Sister   . Diabetes Sister   . Diabetes Brother   . Cancer Paternal Aunt     BREAST    Social History:  reports that she has never smoked. She does not have any smokeless tobacco history on file. She reports that she does not drink alcohol or use illicit drugs.  Review of Systems:  Hypertension:  she is on lisinopril 10 mg alone though for hypertension with good control  Lipids: Has been on pravastatin for quite some time and her LDL is just below 100  Lab Results  Component Value Date   CHOL 173 10/02/2014   HDL 57.50 10/02/2014   LDLCALC 99 10/02/2014   LDLDIRECT 144* 04/02/2006   TRIG 85.0 10/02/2014   CHOLHDL 3 10/02/2014     She has a long-standing history of euthyroid multinodular goiter.  No local  pressure sensation or difficulty swallowing She has been euthyroid   Lab Results  Component Value Date   TSH 0.65 02/11/2015   Recently having difficulties with trigger finger in the right thumb   Examination:   BP 110/78 mmHg  Pulse 84  Temp(Src) 97.7 F (36.5 C)  Resp 14  Ht  5\' 3"  (1.6 m)  Wt 157 lb 9.6 oz (71.487 kg)  BMI 27.92 kg/m2  SpO2 98%  Body mass index is 27.92 kg/(m^2).   No pedal edema  ASSESSMENT/ PLAN:  Diabetes type 2   Blood glucose control appears to be somewhat better with A1c 7.2 She is however not monitoring blood sugars much at all because of various issues Fasting glucose in the lab is excellent Currently is starting to improve her diet with lower fat intake Also is starting a walking program which may improve her control also  We'll have her continue her regimen of Victoza 1.2, metformin and low-dose Amaryl Encouraged her to check mornings after meals  HYPERTENSION: Her blood pressure is well-controlled      Patient Instructions  Check blood sugars on waking up  2-3 times a week Also check blood sugars about 2 hours after a meal and do this after different meals by rotation  Recommended blood sugar levels on waking up is 90-130 and about 2 hours after meal is 130-160  Please bring your blood sugar monitor to each visit, thank you  Walk daily      Onie Hayashi 02/14/2015, 4:01 PM

## 2015-02-14 NOTE — Patient Instructions (Signed)
Check blood sugars on waking up  2-3 times a week Also check blood sugars about 2 hours after a meal and do this after different meals by rotation  Recommended blood sugar levels on waking up is 90-130 and about 2 hours after meal is 130-160  Please bring your blood sugar monitor to each visit, thank you  Walk daily

## 2015-02-14 NOTE — Progress Notes (Signed)
Diabetes Self-Management Education  Visit Type:  Follow-up  Appt. Start Time: 1600 Appt. End Time: 1645  02/14/2015  [name], identified by name and date of birth, is a 49 y.o. female with a diagnosis of Diabetes type 2  .    Patient is here alone.  Her A1C has decreased and she has lost 5 lbs since last visit 2 months ago.    She remains very depressed but has been able to return to counseling.  She has been unable to eat breakfast and dinner but will drink a Glucerna or Boost Glucose Control each morning.  She is now affording her medications.  She has not followed up about her c-pap yet.  She is not checking her blood sugar secondary to pain except for a few times prior to her visit with MD.  She is still not getting out of the house much but does go to work for the [employer] "because she has too". She remains very stressed about her children,death of 51 yo grandchild, and son who is homeless.  Victoza has decreased her appetite probably along with continued depression.   Wt Readings from Last 3 Encounters:  02/14/15 157 lb (71.215 kg)  02/14/15 157 lb 9.6 oz (71.487 kg)  01/29/15 157 lb 6.4 oz (71.396 kg)   ASSESSMENT  Weight 157 lb (71.215 kg). Body mass index is 27.82 kg/(m^2).       Diabetes Self-Management Education - 02/14/15 1605    Psychosocial Assessment   Patient Belief/Attitude about Diabetes Motivated to manage diabetes   Self-management support Doctor's office;Friends;Family   Patient Concerns Nutrition/Meal planning;Glycemic Control;Weight Control   Learning Readiness Ready   Complications   Last HgB A1C per patient/outside source 7.2 %  02/11/15.  decreased from 7.6   How often do you check your blood sugar? 0 times/day (not testing)  "too much pain to check"   Fasting Blood glucose range (mg/dL) 70-129   Number of hypoglycemic episodes per month 0   Dietary Intake   Breakfast Glucerna or Boost "nauseas when I try to eat solid food"    Snack (morning) fruit cup   Lunch grilled chicken salad ([employer])   Snack (afternoon) none   Dinner skips or fruit cup and Glucerna or Boost due to no appetite and not feeling well   Snack (evening) none   Beverage(s) water, sips on regular gingerale when nauseated   Exercise   Exercise Type Light (walking / raking leaves)  wants to join a gym   Patient Education   Previous Diabetes Education Yes (please comment)  3 months ago   Disease state  Definition of diabetes, type 1 and 2, and the diagnosis of diabetes   Nutrition management  Role of diet in the treatment of diabetes and the relationship between the three main macronutrients and blood glucose level;Food label reading, portion sizes and measuring food.   Physical activity and exercise  Role of exercise on diabetes management, blood pressure control and cardiac health.   Monitoring Other (comment)  discussed testing blood ugar on the sides of fingers rather than tip to decrease pain.   Chronic complications Relationship between chronic complications and blood glucose control   Psychosocial adjustment Worked with patient to identify barriers to care and solutions   Personal strategies to promote health Lifestyle issues that need to be addressed for better diabetes care   Individualized Goals (developed by patient)   Nutrition General guidelines for healthy choices and portions discussed  Physical Activity Exercise 5-7 days per week;30 minutes per day   Medications take my medication as prescribed   Monitoring  test my blood glucose as discussed   Patient Self-Evaluation of Goals - Patient rates self as meeting previously set goals (% of time)   Nutrition 25 - 50%   Physical Activity 25 - 50%   Medications >75%   Monitoring < 25%   Problem Solving 25 - 50%   Reducing Risk Not Applicable   Health Coping 25 - 50%   Outcomes   Program Status Completed   Subsequent Visit   Since your last visit have you continued or begun to  take your medications as prescribed? Yes   Since your last visit have you experienced any weight changes? Loss   Weight Loss (lbs) 5   Since your last visit, are you checking your blood glucose at least once a day? No      Learning Objective:  Patient will have a greater understanding of diabetes self-management. Patient education plan is to attend individual and/or group sessions per assessed needs and concerns.   Plan:   Patient Instructions  Consider joining a gym.  Try to get some form of exercise most days. Breakfast, lunch, and dinner daily.  If unable to eat use Glucerna or Boost Glucose control.  Aim for 2-3 Carb Choices per meal (30-45 grams) +/- 1 either way  Aim for 0-1 Carbs per snack if hungry  Include protein in moderation with your meals and snacks Consider reading food labels for Total Carbohydrate and Fat Grams of foods Consider checking BG at alternate times per day as directed by MD  Consider taking medication as directed by MD    Expected Outcomes:  Other (comment) (Demonstrated interest in learning but depression is preventing some change)  Education material provided: Living Well with Diabetes, Food label handouts, Meal plan card and My Plate, snack list Samples of Glucerna and coupons provided.  If problems or questions, patient to contact team via:  Phone and Email  Future DSME appointment: - 3-4 months

## 2015-02-26 ENCOUNTER — Ambulatory Visit: Payer: BLUE CROSS/BLUE SHIELD | Admitting: Family Medicine

## 2015-06-03 MED FILL — METFORMIN HCL ER 500 MG TAB: 500 | 30 days supply | Qty: 120 | Fill #2

## 2015-06-03 MED FILL — PRAVASTATIN NA 40 MG TAB: 40 | 90 days supply | Qty: 90 | Fill #2

## 2015-06-03 MED FILL — LISINOPRIL 10 MG TABLET: 10 | 90 days supply | Qty: 90 | Fill #2

## 2015-06-11 ENCOUNTER — Other Ambulatory Visit: Payer: BLUE CROSS/BLUE SHIELD

## 2015-06-14 ENCOUNTER — Ambulatory Visit: Payer: BLUE CROSS/BLUE SHIELD | Admitting: Endocrinology

## 2015-06-14 ENCOUNTER — Ambulatory Visit: Payer: BLUE CROSS/BLUE SHIELD | Admitting: Dietician

## 2015-07-26 DIAGNOSIS — E119 Type 2 diabetes mellitus without complications: Secondary | ICD-10-CM | POA: Diagnosis not present

## 2015-07-26 LAB — HM DIABETES EYE EXAM

## 2015-08-01 ENCOUNTER — Encounter: Payer: Self-pay | Admitting: Family Medicine

## 2015-08-15 MED FILL — ZONISAMIDE 50 MG CAPSULE: 50 | 30 days supply | Qty: 90 | Fill #1

## 2015-08-15 MED FILL — METFORMIN HCL ER 500 MG TAB: 500 | 30 days supply | Qty: 120 | Fill #3

## 2015-09-19 ENCOUNTER — Other Ambulatory Visit (INDEPENDENT_AMBULATORY_CARE_PROVIDER_SITE_OTHER): Payer: BLUE CROSS/BLUE SHIELD

## 2015-09-19 DIAGNOSIS — E119 Type 2 diabetes mellitus without complications: Secondary | ICD-10-CM | POA: Diagnosis not present

## 2015-09-19 LAB — BASIC METABOLIC PANEL
BUN: 11 mg/dL (ref 6–23)
CO2: 30 mEq/L (ref 19–32)
Calcium: 9.6 mg/dL (ref 8.4–10.5)
Chloride: 107 mEq/L (ref 96–112)
Creatinine, Ser: 0.82 mg/dL (ref 0.40–1.20)
GFR: 94.68 mL/min (ref >60.00)
Glucose, Bld: 140 mg/dL — ABNORMAL HIGH (ref 70–99)
Potassium: 4.2 mEq/L (ref 3.5–5.1)
Sodium: 138 mEq/L (ref 135–145)

## 2015-09-19 LAB — HEMOGLOBIN A1C: Hgb A1c MFr Bld: 8.8 % — ABNORMAL HIGH (ref 4.6–6.5)

## 2015-09-19 LAB — MICROALBUMIN / CREATININE URINE RATIO
Creatinine,U: 151.9 mg/dL
Microalb Creat Ratio: 1.3 mg/g (ref 0.0–30.0)
Microalb, Ur: 2 mg/dL — ABNORMAL HIGH (ref 0.0–1.9)

## 2015-09-23 ENCOUNTER — Encounter: Payer: Self-pay | Admitting: Endocrinology

## 2015-09-23 ENCOUNTER — Ambulatory Visit (INDEPENDENT_AMBULATORY_CARE_PROVIDER_SITE_OTHER): Payer: BLUE CROSS/BLUE SHIELD | Admitting: Endocrinology

## 2015-09-23 VITALS — BP 122/80 | HR 96 | Ht 63.0 in | Wt 172.0 lb

## 2015-09-23 DIAGNOSIS — E042 Nontoxic multinodular goiter: Secondary | ICD-10-CM

## 2015-09-23 DIAGNOSIS — E1165 Type 2 diabetes mellitus with hyperglycemia: Secondary | ICD-10-CM | POA: Diagnosis not present

## 2015-09-23 NOTE — Patient Instructions (Addendum)
Check blood sugars on waking up 1-2  times a week  Also check blood sugars about 2 hours after a meal and do this after different meals by rotation  Recommended blood sugar levels on waking up is 90-130 and about 2 hours after meal is 130-160  Please bring your blood sugar monitor to each visit, thank you  Victoza 0.6 for 3 days then 1.2  Restrict Carbs and fats

## 2015-09-23 NOTE — Progress Notes (Signed)
Patient ID: [name], female   DOB: 1965/11/13, 50 y.o.   MRN: [id number]   Reason for Appointment: Diabetes follow-up   History of Present Illness   Diagnosis: Type 2 DIABETES MELITUS  Recent history: Her blood sugars are usually well controlled with using 1.2 mg Victoza along with metformin. Previously was able to also lose some weight with Victoza and this was improving her satiety. She was started on low dose Amaryl, 1 mg in 10/2014  Her A1c had increased in 2/16 to 7.8 but subsequently was better in 2016 Coming back after several months and her A1c is up to 8.8  Current blood sugar patterns, management and problems identified:  Because of her working long hours and cost of medications she has not taken her medications consistently or come back for follow-up  She has not taken Victoza for 2 months and has gained a significant amount of weight  Although she thinks her sugars are only sometimes high in the evenings they are usually under 180, however A1c is higher than expected  Fasting lab glucose was 140 and she things morning sugars are generally better  Her diet has been suboptimal  She has however done more walking     Oral hypoglycemic drugs: Metformin 2g. Amaryl 0.5 mg at supper  Side effects from medications:  Nausea with 1.8 mg Victoza        Monitors blood glucose: Once a day or less.    Glucometer: One Touch.          Blood Glucose readings at home: As above  Hypoglycemia frequency:  none        Meals: 2 meals per day. Largest meal 6:30 pm; she is not planning her meals recently and not eating consistent low fat meals or restricting carbohydrates like fruits       Physical activity: exercise: is relatively active at work, walks 6/7  Has not seen a dietitian in some time   Wt Readings from Last 3 Encounters:  09/23/15 172 lb (78.019 kg)  02/14/15 157 lb (71.215 kg)  02/14/15 157 lb 9.6 oz (71.487 kg)   Lab Results  Component Value Date   HGBA1C 8.8* 09/19/2015   HGBA1C 7.2* 02/11/2015   HGBA1C 7.6* 10/02/2014   Lab Results  Component Value Date   MICROALBUR 2.0* 09/19/2015   LDLCALC 99 10/02/2014   CREATININE 0.82 09/19/2015   Lab on 09/19/2015  Component Date Value Ref Range Status  . Hgb A1c MFr Bld 09/19/2015 8.8* 4.6 - 6.5 % Final   Glycemic Control Guidelines for People with Diabetes:Non Diabetic:  <6%Goal of Therapy: <7%Additional Action Suggested:  >8%   . Sodium 09/19/2015 138  135 - 145 mEq/L Final  . Potassium 09/19/2015 4.2  3.5 - 5.1 mEq/L Final  . Chloride 09/19/2015 107  96 - 112 mEq/L Final  . CO2 09/19/2015 30  19 - 32 mEq/L Final  . Glucose, Bld 09/19/2015 140* 70 - 99 mg/dL Final  . BUN 09/19/2015 11  6 - 23 mg/dL Final  . Creatinine, Ser 09/19/2015 0.82  0.40 - 1.20 mg/dL Final  . Calcium 09/19/2015 9.6  8.4 - 10.5 mg/dL Final  . GFR 09/19/2015 94.68  >60.00 mL/min Final  . Microalb, Ur 09/19/2015 2.0* 0.0 - 1.9 mg/dL Final  . Creatinine,U 09/19/2015 151.9   Final  . Microalb Creat Ratio 09/19/2015 1.3  0.0 - 30.0 mg/g Final        Medication List  This list is accurate as of: 09/23/15  4:42 PM.  Always use your most recent med list.               aspirin 81 MG tablet  Take 81 mg by mouth daily.     baclofen 10 MG tablet  Commonly known as:  LIORESAL     glimepiride 1 MG tablet  Commonly known as:  AMARYL  Take 1 tablet (1 mg total) by mouth daily before supper.     glucose blood test strip  Commonly known as:  ONETOUCH VERIO  Use as instructed to check blood sugars 2 times per day dx code E11.9     Insulin Pen Needle 31G X 5 MM Misc  Commonly known as:  B-D UF III MINI PEN NEEDLES  Use one daily to inject victoza     Liraglutide 18 MG/3ML Sopn  Inject 1.8 mg daily     lisinopril 10 MG tablet  Commonly known as:  PRINIVIL,ZESTRIL  Take 1 tablet (10 mg total) by mouth daily.     metFORMIN 500 MG 24 hr tablet  Commonly known as:  GLUCOPHAGE-XR  TAKE 4 TABLETS BY  MOUTH DAILY WITH SUPPER.     naproxen 250 MG tablet  Commonly known as:  NAPROSYN  Take 1 tablet (250 mg total) by mouth 2 (two) times daily with a meal.     pravastatin 40 MG tablet  Commonly known as:  PRAVACHOL  Take 1 tablet (40 mg total) by mouth daily.        Allergies: No Known Allergies  Past Medical History  Diagnosis Date  . Hypertension   . Diabetes mellitus without complication ([place])   . Hyperthyroidism   . Headache(784.0)     migraine  . Hyperlipidemia   . Depression   . Chicken pox     CHILDHOOD  . Sleep apnea     uses a cpap  . Multinodular goiter 12/28/2012    Past Surgical History  Procedure Laterality Date  . Abdominal hysterectomy    . Cholecystectomy    . Robotic assisted laparoscopic lysis of adhesion N/A 06/02/2012    Procedure: ROBOTIC ASSISTED LAPAROSCOPIC LYSIS OF ADHESION;  Surgeon: Marvene Staff, MD;  Location: [facility];  Service: Gynecology;  Laterality: N/A;  . Robotic assisted salpingo oopherectomy Left 06/02/2012    Procedure: ROBOTIC ASSISTED SALPINGO OOPHORECTOMY;  Surgeon: Marvene Staff, MD;  Location: [facility];  Service: Gynecology;  Laterality: Left;  pelvic washings  . Robot assisted myomectomy Left 06/02/2012    Procedure: ROBOTIC ASSISTED MYOMECTOMY;  Surgeon: Marvene Staff, MD;  Location: [facility];  Service: Gynecology;  Laterality: Left;  anexal mass is a myoma left side  . Ulnar nerve transposition Right 08/15/2013    Procedure: RIGHT ULNAR NERVE IN SITU DECOMPRESSION/POSSIBLE TRANSPOSITION;  Surgeon: Cammie Sickle., MD;  Location: [facility];  Service: Orthopedics;  Laterality: Right;    Family History  Problem Relation Age of Onset  . Arthritis Mother   . Arthritis Father   . Hyperlipidemia Father   . Diabetes Father   . Hypertension Sister   . Mental illness Sister   . Diabetes Sister   . Diabetes Brother   . Cancer Paternal Aunt     BREAST    Social History:  reports that she has never  smoked. She does not have any smokeless tobacco history on file. She reports that she does not drink alcohol or use illicit drugs.  Review of Systems:  Hypertension:  she is on lisinopril 10 mg  for hypertension with good control  Lipids: Has been on pravastatin for quite some time and her LDL is just below 100, Has not seen her PCP in some time  Lab Results  Component Value Date   CHOL 173 10/02/2014   HDL 57.50 10/02/2014   LDLCALC 99 10/02/2014   LDLDIRECT 144* 04/02/2006   TRIG 85.0 10/02/2014   CHOLHDL 3 10/02/2014     She has a long-standing history of euthyroid multinodular goiter.  She has been euthyroid   Lab Results  Component Value Date   TSH 0.65 02/11/2015     Examination:   BP 122/80 mmHg  Pulse 96  Ht 5\' 3"  (1.6 m)  Wt 172 lb (78.019 kg)  BMI 30.48 kg/m2  SpO2 99%  Body mass index is 30.48 kg/(m^2).     ASSESSMENT/ PLAN:  Diabetes type 2 With BMI 30 See history of present illness for detailed discussion of current diabetes management, blood sugar patterns and problems identified With her not taking Victoza and also not watching her diet her A1c is higher than usual at 8.8 Since she did not bring her meter not clear how often she is monitoring Generally is higher postprandial readings She does well when she takes Victoza but she has been concerned about the cost of this  Recommended she go back to Victoza as this appears to be preferred on her insurance. Co-pay card given She will start with 0.6 and then increase to 1.2 mg Continue Amaryl and metformin More consistent planned meals and restrict amounts of fruits and higher fat meals  HYPERTENSION: Her blood pressure is well-controlled      Patient Instructions  Check blood sugars on waking up 1-2  times a week  Also check blood sugars about 2 hours after a meal and do this after different meals by rotation  Recommended blood sugar levels on waking up is 90-130 and about 2 hours after meal  is 130-160  Please bring your blood sugar monitor to each visit, thank you  Victoza 0.6 for 3 days then 1.2  Restrict Carbs and fats      [name] 09/23/2015, 4:42 PM

## 2015-09-27 ENCOUNTER — Other Ambulatory Visit: Payer: Self-pay | Admitting: Endocrinology

## 2015-09-27 NOTE — Telephone Encounter (Signed)
PT called, she needs Metformin and Victoza called into [facility]

## 2015-10-02 DIAGNOSIS — Z1231 Encounter for screening mammogram for malignant neoplasm of breast: Secondary | ICD-10-CM | POA: Diagnosis not present

## 2015-10-22 DIAGNOSIS — R922 Inconclusive mammogram: Secondary | ICD-10-CM | POA: Diagnosis not present

## 2015-10-22 DIAGNOSIS — R928 Other abnormal and inconclusive findings on diagnostic imaging of breast: Secondary | ICD-10-CM | POA: Diagnosis not present

## 2015-10-28 ENCOUNTER — Encounter: Payer: Self-pay | Admitting: Family Medicine

## 2015-12-06 ENCOUNTER — Encounter: Payer: BLUE CROSS/BLUE SHIELD | Admitting: Family Medicine

## 2015-12-18 ENCOUNTER — Telehealth: Payer: Self-pay | Admitting: Endocrinology

## 2015-12-18 MED FILL — VICTOZA 18 MG/3 ML INJECT P: 18 | 90 days supply | Qty: 27 | Fill #0

## 2015-12-18 MED FILL — METFORMIN HCL ER 500 MG TAB: 500 | 30 days supply | Qty: 120 | Fill #0

## 2015-12-18 NOTE — Telephone Encounter (Signed)
Patient is calling on the status of her medication,Haven't had any meds since last visit. please advise

## 2015-12-18 NOTE — Telephone Encounter (Signed)
Pt called in and wanted to discuss some of her meds with the nurse, requests call back.

## 2015-12-19 ENCOUNTER — Other Ambulatory Visit: Payer: BLUE CROSS/BLUE SHIELD

## 2015-12-24 ENCOUNTER — Ambulatory Visit: Payer: BLUE CROSS/BLUE SHIELD | Admitting: Endocrinology

## 2016-01-09 NOTE — Progress Notes (Deleted)
Here for CPE. Hx MMP and poor compliance with medications, follow up and recommendations.   Chronic conditions/new concerns:  DM/Multinodular goiter/Hypothyroid: -sees Dr. Dwyane Dee for this, s/p RAI per her report for goiter -she had labs with Dr. Dwyane Dee in June, hgba1c 8.8 -eye exam: done -foot exam due -diet and exercise:  -Meds: asa, lisinopril, metformin 2000mg  daily, amaryl 1mg  qd,  victoza per last endo notes -denies complications,  HLD: -meds: pravastatin -denies: no leg cramps or cog changes  HTN: -meds: lisinopril 10mg  -denies: CP, SOB, DOE  Migraines: -chronic -sees Dr. Domingo Cocking in neurology  Depression: -On a medication in the past -used to see Richardo Priest for CBT -she denies hx self harm of SI  OSA: -saw [facility] remotely, sleep study in 2003 and reported CPAP use > 10 years ago -wanted referral to see sleep doctor and retesting in 2016; referral done 05/2014 -she never went -symptoms: snores, excessive daytime somnelence, apneic  Hx iron def anemia when menstruated, s/p hysterectomy  -Taking folic acid, vitamin D or calcium: no  -Diabetes and Dyslipidemia Screening: doing today  -Vaccines: UTD  -pap history: s/p hysterectomy - used to see Dr. Garwin Brothers in gyn for women's health, now sees Phillis Knack  -FDLMP: n/a  -sexual activity: yes, female partner, no new partners  -wants STI testing (Hep C if born [birth year]): no  -FH breast, colon or ovarian ca: see FH Last mammogram: 09/2015, [facility], ordered by Phillis Knack Last colon cancer screening:  Breast Ca Risk Assessment: -SolutionApps.it  Genetic Counseling Screen: Http://www.breastcancergenesscreen.org/startScreen.aspx  FRAX (50-65):  DEXA (>/= 65):    -Alcohol, Tobacco, drug use: see social history  Review of Systems - no fevers, unintentional weight loss, vision loss, hearing loss, chest pain, sob, hemoptysis, melena, hematochezia,  hematuria, genital discharge, changing or concerning skin lesions, bleeding, bruising, loc, thoughts of self harm or SI  Past Medical History:  Diagnosis Date  . Chicken pox    CHILDHOOD  . Depression   . Diabetes mellitus without complication ([place])   . Headache(784.0)    migraine  . Hyperlipidemia   . Hypertension   . Hyperthyroidism   . Multinodular goiter 12/28/2012  . Sleep apnea    uses a cpap    Past Surgical History:  Procedure Laterality Date  . ABDOMINAL HYSTERECTOMY    . CHOLECYSTECTOMY    . ROBOT ASSISTED MYOMECTOMY Left 06/02/2012   Procedure: ROBOTIC ASSISTED MYOMECTOMY;  Surgeon: Marvene Staff, MD;  Location: [facility];  Service: Gynecology;  Laterality: Left;  anexal mass is a myoma left side  . ROBOTIC ASSISTED LAPAROSCOPIC LYSIS OF ADHESION N/A 06/02/2012   Procedure: ROBOTIC ASSISTED LAPAROSCOPIC LYSIS OF ADHESION;  Surgeon: Marvene Staff, MD;  Location: [facility];  Service: Gynecology;  Laterality: N/A;  . ROBOTIC ASSISTED SALPINGO OOPHERECTOMY Left 06/02/2012   Procedure: ROBOTIC ASSISTED SALPINGO OOPHORECTOMY;  Surgeon: Marvene Staff, MD;  Location: [facility];  Service: Gynecology;  Laterality: Left;  pelvic washings  . ULNAR NERVE TRANSPOSITION Right 08/15/2013   Procedure: RIGHT ULNAR NERVE IN SITU DECOMPRESSION/POSSIBLE TRANSPOSITION;  Surgeon: Cammie Sickle., MD;  Location: [facility];  Service: Orthopedics;  Laterality: Right;    Family History  Problem Relation Age of Onset  . Arthritis Mother   . Arthritis Father   . Hyperlipidemia Father   . Diabetes Father   . Hypertension Sister   . Mental illness Sister   . Diabetes Sister   . Diabetes Brother   . Cancer  Paternal Aunt     BREAST    Social History   Social History  . Marital status: Divorced    Spouse name: N/A  . Number of children: N/A  . Years of education: N/A   Social History Main Topics  . Smoking status: Never Smoker  . Smokeless tobacco: Not on file   . Alcohol use No  . Drug use: No  . Sexual activity: Not on file   Other Topics Concern  . Not on file   Social History Narrative   Work or School: Probation officer      Home Situation: lives with fiance      Spiritual Beliefs:seventh day adventist      Lifestyle: no regular exercise; diet is ok           Current Outpatient Prescriptions:  .  aspirin 81 MG tablet, Take 81 mg by mouth daily., Disp: , Rfl:  .  baclofen (LIORESAL) 10 MG tablet, , Disp: , Rfl: 0 .  glimepiride (AMARYL) 1 MG tablet, Take 1 tablet (1 mg total) by mouth daily before supper., Disp: 30 tablet, Rfl: 2 .  glucose blood (ONETOUCH VERIO) test strip, Use as instructed to check blood sugars 2 times per day dx code E11.9, Disp: 100 each, Rfl: 0 .  Insulin Pen Needle (B-D UF III MINI PEN NEEDLES) 31G X 5 MM MISC, Use one daily to inject victoza, Disp: 30 each, Rfl: 0 .  lisinopril (PRINIVIL,ZESTRIL) 10 MG tablet, Take 1 tablet (10 mg total) by mouth daily., Disp: 90 tablet, Rfl: 5 .  metFORMIN (GLUCOPHAGE-XR) 500 MG 24 hr tablet, TAKE 4 TABLETS BY MOUTH DAILY WITH SUPPER., Disp: 120 tablet, Rfl: 3 .  naproxen (NAPROSYN) 250 MG tablet, Take 1 tablet (250 mg total) by mouth 2 (two) times daily with a meal., Disp: 14 tablet, Rfl: 0 .  pravastatin (PRAVACHOL) 40 MG tablet, Take 1 tablet (40 mg total) by mouth daily., Disp: 90 tablet, Rfl: 3 .  VICTOZA 18 MG/3ML SOPN, INJECT 1.8 MG AS DIRECTED DAILY, Disp: 27 mL, Rfl: 2  EXAM:  There were no vitals filed for this visit.  GENERAL: vitals reviewed and listed below, alert, oriented, appears well hydrated and in no acute distress  HEENT: head atraumatic, PERRLA, normal appearance of eyes, ears, nose and mouth. moist mucus membranes.  NECK: supple, no masses or lymphadenopathy  LUNGS: clear to auscultation bilaterally, no rales, rhonchi or wheeze  CV: HRRR, no peripheral edema or cyanosis, normal pedal pulses  BREAST: normal  appearance - no lesions or discharge, on palpation normal breast tissue without any suspicious masses  ABDOMEN: bowel sounds normal, soft, non tender to palpation, no masses, no rebound or guarding  GU: normal appearance of external genitalia - no lesions or masses, normal vaginal mucosa - no abnormal discharge, normal appearance of cervix - no lesions or abnormal discharge, no masses or tenderness on palpation of uterus and ovaries.  RECTAL: refused  SKIN: no rash or abnormal lesions  MS: normal gait, moves all extremities normally  NEURO: normal gait, speech and thought processing grossly intact, muscle tone grossly intact throughout  PSYCH: normal affect, pleasant and cooperative  ASSESSMENT AND PLAN:  Discussed the following assessment and plan:  There are no diagnoses linked to this encounter.  -Discussed and advised all [place] preventive services health task force level A and B recommendations for age, sex and risks.  -Advised at least 150 minutes of exercise per week and a  healthy low sugar or no added sugar diet   -Advised and offered all HM due:  1)colon ca screening (discussed options and advised to do, offered to order) 2)gyn physical 3)Foot exam 4) Flu shot  -labs, studies and vaccines per orders this encounter  No orders of the defined types were placed in this encounter.   Patient advised to return to clinic immediately if symptoms worsen or persist or new concerns.  There are no Patient Instructions on file for this visit.  No Follow-up on file.  Colin Benton R., DO

## 2016-01-10 ENCOUNTER — Encounter: Payer: BLUE CROSS/BLUE SHIELD | Admitting: Family Medicine

## 2016-01-10 DIAGNOSIS — Z0289 Encounter for other administrative examinations: Secondary | ICD-10-CM

## 2016-01-17 ENCOUNTER — Other Ambulatory Visit (INDEPENDENT_AMBULATORY_CARE_PROVIDER_SITE_OTHER): Payer: BLUE CROSS/BLUE SHIELD

## 2016-01-17 DIAGNOSIS — E1165 Type 2 diabetes mellitus with hyperglycemia: Secondary | ICD-10-CM | POA: Diagnosis not present

## 2016-01-17 DIAGNOSIS — E042 Nontoxic multinodular goiter: Secondary | ICD-10-CM | POA: Diagnosis not present

## 2016-01-17 LAB — BASIC METABOLIC PANEL
BUN: 9 mg/dL (ref 6–23)
CO2: 29 mEq/L (ref 19–32)
Calcium: 9.6 mg/dL (ref 8.4–10.5)
Chloride: 107 mEq/L (ref 96–112)
Creatinine, Ser: 0.81 mg/dL (ref 0.40–1.20)
GFR: 95.9 mL/min (ref >60.00)
Glucose, Bld: 128 mg/dL — ABNORMAL HIGH (ref 70–99)
Potassium: 4.7 mEq/L (ref 3.5–5.1)
Sodium: 141 mEq/L (ref 135–145)

## 2016-01-17 LAB — LIPID PANEL
Cholesterol: 166 mg/dL (ref 0–200)
HDL: 48.1 mg/dL (ref >39.00)
LDL Cholesterol: 97 mg/dL (ref 0–99)
NonHDL: 117.58
Total CHOL/HDL Ratio: 3
Triglycerides: 103 mg/dL (ref 0.0–149.0)
VLDL: 20.6 mg/dL (ref 0.0–40.0)

## 2016-01-17 LAB — HEMOGLOBIN A1C: Hgb A1c MFr Bld: 10.3 % — ABNORMAL HIGH (ref 4.6–6.5)

## 2016-01-17 LAB — TSH: TSH: 0.56 u[IU]/mL (ref 0.35–4.50)

## 2016-01-23 ENCOUNTER — Ambulatory Visit (INDEPENDENT_AMBULATORY_CARE_PROVIDER_SITE_OTHER): Payer: BLUE CROSS/BLUE SHIELD | Admitting: Endocrinology

## 2016-01-23 ENCOUNTER — Encounter: Payer: Self-pay | Admitting: Endocrinology

## 2016-01-23 VITALS — BP 124/80 | HR 86 | Ht 63.0 in | Wt 167.0 lb

## 2016-01-23 DIAGNOSIS — E1165 Type 2 diabetes mellitus with hyperglycemia: Secondary | ICD-10-CM | POA: Diagnosis not present

## 2016-01-23 NOTE — Patient Instructions (Addendum)
May take Victoza at nite  Check blood sugars on waking up  2-3x weekly  Also check blood sugars about 2 hours after a meal and do this after different meals by rotation  Recommended blood sugar levels on waking up is 90-130 and about 2 hours after meal is 130-160  Please bring your blood sugar monitor to each visit, thank you

## 2016-01-23 NOTE — Progress Notes (Signed)
Patient ID: [name], female   DOB: Feb 12, 1966, 50 y.o.   MRN: [id number]   Reason for Appointment: Diabetes follow-up   History of Present Illness   Diagnosis: Type 2 DIABETES MELITUS  Recent history:   Her blood sugars previously had been usually well controlled with using 1.2 mg Victoza along with metformin. Previously was able to also lose some weight with Victoza and this was improving her satiety. She was started on low dose Amaryl, 1 mg in 10/2014  Her A1c had increased in 6/17 as she ran out of Victoza She was given a prescription and co-pay card in June but she thinks that the pharmacy did not get the prescription  Current blood sugar patterns, management and problems identified:  She did not start back on Victoza until 2-3 weeks ago apparently.  Not clear why she did not call for a prescription or why it was not received at the pharmacy in June.  She also was having nausea and she thinks this was from metformin and she reduced it to 2 tablets a day  As she ran out of test strip she has not checked her sugar in 2 months  Previously her blood sugars had been mostly higher in the evenings, as high as 421 at bedtime  Also morning readings are high with A1c good reading of 145  She says she does not eat consistently  Also because of headache she does not do any walking  She forgets to take her VICTOZA in the morning sometimes when she is going to work  Fasting lab glucose was 128  She thinks she is avoiding high-fat foods usually     Oral hypoglycemic drugs: Metformin 1.5 g. Amaryl 0.5 mg at supper  Side effects from medications:  Nausea with ? Metformin 2g        Monitors blood glucose: Once a day or less.    Glucometer: One Touch.          Blood Glucose readings at home: As above  Hypoglycemia frequency:  none        Meals: 2 meals per day. Largest meal 6:30 pm; some carbohydrates like fruits       Physical activity: exercise: is relatively active  at work  Has not seen a dietitian in some time   Wt Readings from Last 3 Encounters:  01/23/16 167 lb (75.8 kg)  09/23/15 172 lb (78 kg)  02/14/15 157 lb (71.2 kg)   Lab Results  Component Value Date   HGBA1C 10.3 (H) 01/17/2016   HGBA1C 8.8 (H) 09/19/2015   HGBA1C 7.2 (H) 02/11/2015   Lab Results  Component Value Date   MICROALBUR 2.0 (H) 09/19/2015   LDLCALC 97 01/17/2016   CREATININE 0.81 01/17/2016   Lab on 01/17/2016  Component Date Value Ref Range Status  . Hgb A1c MFr Bld 01/17/2016 10.3* 4.6 - 6.5 % Final  . Sodium 01/17/2016 141  135 - 145 mEq/L Final  . Potassium 01/17/2016 4.7  3.5 - 5.1 mEq/L Final  . Chloride 01/17/2016 107  96 - 112 mEq/L Final  . CO2 01/17/2016 29  19 - 32 mEq/L Final  . Glucose, Bld 01/17/2016 128* 70 - 99 mg/dL Final  . BUN 01/17/2016 9  6 - 23 mg/dL Final  . Creatinine, Ser 01/17/2016 0.81  0.40 - 1.20 mg/dL Final  . Calcium 01/17/2016 9.6  8.4 - 10.5 mg/dL Final  . GFR 01/17/2016 95.90  >60.00 mL/min Final  . Cholesterol 01/17/2016 166  0 - 200 mg/dL Final  . Triglycerides 01/17/2016 103.0  0.0 - 149.0 mg/dL Final  . HDL 01/17/2016 48.10  >39.00 mg/dL Final  . VLDL 01/17/2016 20.6  0.0 - 40.0 mg/dL Final  . LDL Cholesterol 01/17/2016 97  0 - 99 mg/dL Final  . Total CHOL/HDL Ratio 01/17/2016 3   Final  . NonHDL 01/17/2016 117.58   Final  . TSH 01/17/2016 0.56  0.35 - 4.50 uIU/mL Final        Medication List       Accurate as of 01/23/16  3:15 PM. Always use your most recent med list.          aspirin 81 MG tablet Take 81 mg by mouth daily.   baclofen 10 MG tablet Commonly known as:  LIORESAL   glimepiride 1 MG tablet Commonly known as:  AMARYL Take 1 tablet (1 mg total) by mouth daily before supper.   glucose blood test strip Commonly known as:  ONETOUCH VERIO Use as instructed to check blood sugars 2 times per day dx code E11.9   Insulin Pen Needle 31G X 5 MM Misc Commonly known as:  B-D UF III MINI PEN  NEEDLES Use one daily to inject victoza   lisinopril 10 MG tablet Commonly known as:  PRINIVIL,ZESTRIL Take 1 tablet (10 mg total) by mouth daily.   metFORMIN 500 MG 24 hr tablet Commonly known as:  GLUCOPHAGE-XR TAKE 4 TABLETS BY MOUTH DAILY WITH SUPPER.   naproxen 250 MG tablet Commonly known as:  NAPROSYN Take 1 tablet (250 mg total) by mouth 2 (two) times daily with a meal.   pravastatin 40 MG tablet Commonly known as:  PRAVACHOL Take 1 tablet (40 mg total) by mouth daily.   VICTOZA 18 MG/3ML Sopn Generic drug:  liraglutide INJECT 1.8 MG AS DIRECTED DAILY       Allergies: No Known Allergies  Past Medical History:  Diagnosis Date  . Chicken pox    CHILDHOOD  . Depression   . Diabetes mellitus without complication ([place])   . Headache(784.0)    migraine  . Hyperlipidemia   . Hypertension   . Hyperthyroidism   . Multinodular goiter 12/28/2012  . Sleep apnea    uses a cpap    Past Surgical History:  Procedure Laterality Date  . ABDOMINAL HYSTERECTOMY    . CHOLECYSTECTOMY    . ROBOT ASSISTED MYOMECTOMY Left 06/02/2012   Procedure: ROBOTIC ASSISTED MYOMECTOMY;  Surgeon: Marvene Staff, MD;  Location: [facility];  Service: Gynecology;  Laterality: Left;  anexal mass is a myoma left side  . ROBOTIC ASSISTED LAPAROSCOPIC LYSIS OF ADHESION N/A 06/02/2012   Procedure: ROBOTIC ASSISTED LAPAROSCOPIC LYSIS OF ADHESION;  Surgeon: Marvene Staff, MD;  Location: [facility];  Service: Gynecology;  Laterality: N/A;  . ROBOTIC ASSISTED SALPINGO OOPHERECTOMY Left 06/02/2012   Procedure: ROBOTIC ASSISTED SALPINGO OOPHORECTOMY;  Surgeon: Marvene Staff, MD;  Location: [facility];  Service: Gynecology;  Laterality: Left;  pelvic washings  . ULNAR NERVE TRANSPOSITION Right 08/15/2013   Procedure: RIGHT ULNAR NERVE IN SITU DECOMPRESSION/POSSIBLE TRANSPOSITION;  Surgeon: Cammie Sickle., MD;  Location: [facility];  Service: Orthopedics;  Laterality: Right;    Family  History  Problem Relation Age of Onset  . Arthritis Mother   . Arthritis Father   . Hyperlipidemia Father   . Diabetes Father   . Hypertension Sister   . Mental illness Sister   . Diabetes Sister   . Diabetes Brother   .  Cancer Paternal Aunt     BREAST    Social History:  reports that she has never smoked. She does not have any smokeless tobacco history on file. She reports that she does not drink alcohol or use drugs.  Review of Systems:   Has had Headaches recently, followed by a clinic  Hypertension:  she is on lisinopril 10 mg  for hypertension with good control  Lipids: Has been on pravastatin for quite some time and her LDL is again below 100  Lab Results  Component Value Date   CHOL 166 01/17/2016   HDL 48.10 01/17/2016   LDLCALC 97 01/17/2016   LDLDIRECT 144 (H) 04/02/2006   TRIG 103.0 01/17/2016   CHOLHDL 3 01/17/2016     She has a long-standing history of euthyroid multinodular goiter.  She has been euthyroid   Lab Results  Component Value Date   TSH 0.56 01/17/2016     Examination:   BP 124/80   Pulse 86   Ht 5\' 3"  (1.6 m)   Wt 167 lb (75.8 kg)   SpO2 98%   BMI 29.58 kg/m   Body mass index is 29.58 kg/m.     ASSESSMENT/ PLAN:  Diabetes type 2 With BMI 30 See history of present illness for detailed discussion of current diabetes management, blood sugar patterns and problems identified  With her not taking Victoza Except in the last 3 weeks her blood sugars appear to be poorly controlled and A1c is markedly increased over 10% now. She does not appear to be motivated to take care of her diabetes Not testing for the last 2 months  Discussed need to be compliant with all her self-care measures She does have prescriptions for all medications She can try doing Victoza at night for better compliance She is not able to take 2000 mg of metformin she can continue 1500 mg More consistent monitoring and new prescription will be sent for testing  supplies  HYPERTENSION: Her blood pressure is well-controlled      Patient Instructions  May take Victoza at nite  Check blood sugars on waking up  2-3x weekly  Also check blood sugars about 2 hours after a meal and do this after different meals by rotation  Recommended blood sugar levels on waking up is 90-130 and about 2 hours after meal is 130-160  Please bring your blood sugar monitor to each visit, thank you     [facility] 01/23/2016, 3:15 PM

## 2016-01-31 ENCOUNTER — Other Ambulatory Visit: Payer: Self-pay | Admitting: Endocrinology

## 2016-01-31 ENCOUNTER — Other Ambulatory Visit: Payer: Self-pay | Admitting: Family Medicine

## 2016-01-31 DIAGNOSIS — E7800 Pure hypercholesterolemia, unspecified: Secondary | ICD-10-CM

## 2016-01-31 DIAGNOSIS — I10 Essential (primary) hypertension: Secondary | ICD-10-CM

## 2016-01-31 MED FILL — ONETOUCH VERIO TEST STRIP: 50 days supply | Qty: 100 | Fill #0

## 2016-01-31 MED FILL — PRAVASTATIN NA 40 MG TAB: 40 | 30 days supply | Qty: 30 | Fill #0

## 2016-01-31 MED FILL — LISINOPRIL 10 MG TABLET: 10 | 30 days supply | Qty: 30 | Fill #0

## 2016-01-31 MED FILL — UNIFINE PENTIPS 31GX3/16": 31G X 5 MM | 90 days supply | Qty: 100 | Fill #0

## 2016-01-31 MED FILL — UNIFINE PENTIPS 31GX3/16: 31G X 5 MM | 90 days supply | Qty: 100 | Fill #0

## 2016-02-12 MED FILL — METFORMIN HCL ER 500 MG TAB: 500 | 30 days supply | Qty: 120 | Fill #1

## 2016-02-24 DIAGNOSIS — R079 Chest pain, unspecified: Secondary | ICD-10-CM | POA: Diagnosis not present

## 2016-02-24 DIAGNOSIS — E119 Type 2 diabetes mellitus without complications: Secondary | ICD-10-CM | POA: Diagnosis not present

## 2016-02-24 DIAGNOSIS — E785 Hyperlipidemia, unspecified: Secondary | ICD-10-CM | POA: Diagnosis not present

## 2016-02-24 DIAGNOSIS — E042 Nontoxic multinodular goiter: Secondary | ICD-10-CM | POA: Diagnosis not present

## 2016-02-24 DIAGNOSIS — I10 Essential (primary) hypertension: Secondary | ICD-10-CM | POA: Diagnosis not present

## 2016-02-24 MED FILL — FLUoxetine HCL 20 MG CAPS: 20 | 30 days supply | Qty: 30 | Fill #0

## 2016-02-24 MED FILL — NAPROXEN 500 MG TABLET: 500 | 14 days supply | Qty: 28 | Fill #0

## 2016-03-02 ENCOUNTER — Other Ambulatory Visit (INDEPENDENT_AMBULATORY_CARE_PROVIDER_SITE_OTHER): Payer: BLUE CROSS/BLUE SHIELD

## 2016-03-02 DIAGNOSIS — E1165 Type 2 diabetes mellitus with hyperglycemia: Secondary | ICD-10-CM

## 2016-03-02 LAB — GLUCOSE, RANDOM: Glucose, Bld: 114 mg/dL — ABNORMAL HIGH (ref 70–99)

## 2016-03-03 LAB — FRUCTOSAMINE: Fructosamine: 276 umol/L (ref 0–285)

## 2016-03-05 ENCOUNTER — Encounter: Payer: Self-pay | Admitting: Endocrinology

## 2016-03-05 ENCOUNTER — Ambulatory Visit (INDEPENDENT_AMBULATORY_CARE_PROVIDER_SITE_OTHER): Payer: BLUE CROSS/BLUE SHIELD | Admitting: Endocrinology

## 2016-03-05 VITALS — BP 110/88 | HR 88 | Wt 161.0 lb

## 2016-03-05 DIAGNOSIS — E1165 Type 2 diabetes mellitus with hyperglycemia: Secondary | ICD-10-CM

## 2016-03-05 NOTE — Patient Instructions (Signed)
Glimeperide in am not pm  More sugars at bedtime

## 2016-03-05 NOTE — Progress Notes (Signed)
Patient ID: [name], female   DOB: 1965/10/11, 50 y.o.   MRN: [id number]   Reason for Appointment: Diabetes follow-up   History of Present Illness   Diagnosis: Type 2 DIABETES MELITUS  Recent history:   Her blood sugars previously had been usually well controlled with using 1.2 mg Victoza along with metformin. Previously was able to also lose some weight with Victoza and this was improving her satiety. She was started on low dose Amaryl, 1 mg in 10/2014  LAST A1c = 10.3 in 10/17, previously 8.8  Current blood sugar patterns, management and problems identified:  Continuing her Victoza she has lost weight.  Overall control is improving with fructosamine 276  Checking blood sugars very sporadically and mostly in the mornings, the last couple of days control has been better, also lab glucose was 114  She is sometimes goes off her diet including at breakfast with occasional readings over 200  She is now able to take metformin consistently without nausea, now taking 1500 mg     Oral hypoglycemic drugs: Metformin 1.5 g. Amaryl 0.5 mg at supper  Side effects from medications:  Nausea with ? Metformin 2g        Monitors blood glucose: Once a day or less.    Glucometer: One Touch.          Blood Glucose readings at home: As Below  FASTING range 105-156, PC breakfast 193-223 Suppertime 148 Overall MEDIAN 148  Hypoglycemia frequency:  none        Meals: 2 meals per day. Largest meal 6:30 pm; some carbohydrates like fruits       Physical activity: exercise: is relatively active at work  Has not seen a dietitian in some time   Wt Readings from Last 3 Encounters:  03/05/16 161 lb (73 kg)  01/23/16 167 lb (75.8 kg)  09/23/15 172 lb (78 kg)   Lab Results  Component Value Date   HGBA1C 10.3 (H) 01/17/2016   HGBA1C 8.8 (H) 09/19/2015   HGBA1C 7.2 (H) 02/11/2015   Lab Results  Component Value Date   MICROALBUR 2.0 (H) 09/19/2015   LDLCALC 97 01/17/2016   CREATININE 0.81 01/17/2016   Lab on 03/02/2016  Component Date Value Ref Range Status  . Fructosamine 03/03/2016 276  0 - 285 umol/L Final   Comment: Published reference interval for apparently healthy subjects between age 66 and 34 is 34 - 285 umol/L and in a poorly controlled diabetic population is 228 - 563 umol/L with a mean of 396 umol/L.   Marland Kitchen Glucose, Bld 03/02/2016 114* 70 - 99 mg/dL Final   Other problems addressed: See review of systems     Medication List       Accurate as of 03/05/16 11:59 PM. Always use your most recent med list.          aspirin 81 MG tablet Take 81 mg by mouth daily.   B-D UF III MINI PEN NEEDLES 31G X 5 MM Misc Generic drug:  Insulin Pen Needle USE 1 TO INJECT VICTOZA   baclofen 10 MG tablet Commonly known as:  LIORESAL   FLUoxetine 20 MG capsule Commonly known as:  PROZAC Take 20 mg by mouth daily.   glimepiride 1 MG tablet Commonly known as:  AMARYL Take 1 tablet (1 mg total) by mouth daily before supper.   lisinopril 10 MG tablet Commonly known as:  PRINIVIL,ZESTRIL Take 1 tablet (10 mg total) by mouth daily.   lisinopril 10 MG  tablet Commonly known as:  PRINIVIL,ZESTRIL TAKE 1 TABLET (10 MG TOTAL) BY MOUTH DAILY.   metFORMIN 500 MG 24 hr tablet Commonly known as:  GLUCOPHAGE-XR TAKE 4 TABLETS BY MOUTH DAILY WITH SUPPER.   naproxen 250 MG tablet Commonly known as:  NAPROSYN Take 1 tablet (250 mg total) by mouth 2 (two) times daily with a meal.   naproxen 500 MG tablet Commonly known as:  NAPROSYN Take 500 mg by mouth every 12 (twelve) hours.   ONETOUCH VERIO test strip Generic drug:  glucose blood USE TO CHECK BLOOD SUGAR TWICE DAILY AS DIRECTED   pravastatin 40 MG tablet Commonly known as:  PRAVACHOL Take 1 tablet (40 mg total) by mouth daily.   pravastatin 40 MG tablet Commonly known as:  PRAVACHOL TAKE 1 TABLET (40 MG TOTAL) BY MOUTH DAILY.   VICTOZA 18 MG/3ML Sopn Generic drug:  liraglutide INJECT 1.8 MG  AS DIRECTED DAILY       Allergies: No Known Allergies  Past Medical History:  Diagnosis Date  . Chicken pox    CHILDHOOD  . Depression   . Diabetes mellitus without complication ([place])   . Headache(784.0)    migraine  . Hyperlipidemia   . Hypertension   . Hyperthyroidism   . Multinodular goiter 12/28/2012  . Sleep apnea    uses a cpap    Past Surgical History:  Procedure Laterality Date  . ABDOMINAL HYSTERECTOMY    . CHOLECYSTECTOMY    . ROBOT ASSISTED MYOMECTOMY Left 06/02/2012   Procedure: ROBOTIC ASSISTED MYOMECTOMY;  Surgeon: Marvene Staff, MD;  Location: [facility];  Service: Gynecology;  Laterality: Left;  anexal mass is a myoma left side  . ROBOTIC ASSISTED LAPAROSCOPIC LYSIS OF ADHESION N/A 06/02/2012   Procedure: ROBOTIC ASSISTED LAPAROSCOPIC LYSIS OF ADHESION;  Surgeon: Marvene Staff, MD;  Location: [facility];  Service: Gynecology;  Laterality: N/A;  . ROBOTIC ASSISTED SALPINGO OOPHERECTOMY Left 06/02/2012   Procedure: ROBOTIC ASSISTED SALPINGO OOPHORECTOMY;  Surgeon: Marvene Staff, MD;  Location: [facility];  Service: Gynecology;  Laterality: Left;  pelvic washings  . ULNAR NERVE TRANSPOSITION Right 08/15/2013   Procedure: RIGHT ULNAR NERVE IN SITU DECOMPRESSION/POSSIBLE TRANSPOSITION;  Surgeon: Cammie Sickle., MD;  Location: [facility];  Service: Orthopedics;  Laterality: Right;    Family History  Problem Relation Age of Onset  . Arthritis Mother   . Arthritis Father   . Hyperlipidemia Father   . Diabetes Father   . Hypertension Sister   . Mental illness Sister   . Diabetes Sister   . Diabetes Brother   . Cancer Paternal Aunt     BREAST    Social History:  reports that she has never smoked. She does not have any smokeless tobacco history on file. She reports that she does not drink alcohol or use drugs.  Review of Systems:   Hypertension:  she is on lisinopril 10 mg  for hypertension and followed by PCP  Lipids: Has been on  pravastatin for quite some time and her LDL is  below 100  Lab Results  Component Value Date   CHOL 166 01/17/2016   HDL 48.10 01/17/2016   LDLCALC 97 01/17/2016   LDLDIRECT 144 (H) 04/02/2006   TRIG 103.0 01/17/2016   CHOLHDL 3 01/17/2016     She has a long-standing history of euthyroid multinodular goiter.  She has been euthyroid   Lab Results  Component Value Date   TSH 0.56 01/17/2016  Examination:   BP 110/88   Pulse 88   Wt 161 lb (73 kg)   SpO2 98%   BMI 28.52 kg/m   Body mass index is 28.52 kg/m.     ASSESSMENT/ PLAN:  Diabetes type 2 With BMI 30 See history of present illness for detailed discussion of current diabetes management, blood sugar patterns and problems identified  He has been back on Victoza and this appears to be helping her with blood sugar control as well as weight loss Fructosamine is upper normal She is still not consistent with diet occasionally and may have higher postprandial reading  Recommendations: Since her fasting readings are relatively better than in the evening she can switch her Amaryl to the morning now Encouraged her to start walking in addition to her activity at work Also reminded her to check more readings after supper which she is not doing Recheck A1c in a couple of months She will follow-up with the dietitian to help more consistent diet     Patient Instructions  Glimeperide in am not pm  More sugars at bedtime    [name] 03/06/2016, 9:26 PM

## 2016-03-06 ENCOUNTER — Encounter: Payer: BLUE CROSS/BLUE SHIELD | Admitting: Dietician

## 2016-03-06 ENCOUNTER — Other Ambulatory Visit: Payer: Self-pay | Admitting: Endocrinology

## 2016-03-06 DIAGNOSIS — E1165 Type 2 diabetes mellitus with hyperglycemia: Secondary | ICD-10-CM

## 2016-03-09 ENCOUNTER — Other Ambulatory Visit: Payer: Self-pay | Admitting: Family Medicine

## 2016-03-09 DIAGNOSIS — E7800 Pure hypercholesterolemia, unspecified: Secondary | ICD-10-CM

## 2016-03-26 DIAGNOSIS — I10 Essential (primary) hypertension: Secondary | ICD-10-CM | POA: Diagnosis not present

## 2016-03-26 DIAGNOSIS — F321 Major depressive disorder, single episode, moderate: Secondary | ICD-10-CM | POA: Diagnosis not present

## 2016-03-26 DIAGNOSIS — M25522 Pain in left elbow: Secondary | ICD-10-CM | POA: Diagnosis not present

## 2016-03-26 DIAGNOSIS — E785 Hyperlipidemia, unspecified: Secondary | ICD-10-CM | POA: Diagnosis not present

## 2016-03-26 MED FILL — FLUoxetine HCL 20 MG CAPS: 20 | 30 days supply | Qty: 30 | Fill #0

## 2016-03-26 MED FILL — LISINOPRIL 10 MG TABLET: 10 | 30 days supply | Qty: 30 | Fill #0

## 2016-03-26 MED FILL — PRAVASTATIN NA 40 MG TAB: 40 | 30 days supply | Qty: 30 | Fill #0

## 2016-04-30 MED FILL — NAPROXEN 500 MG TABLET: 500 | 14 days supply | Qty: 28 | Fill #0

## 2016-04-30 MED FILL — METFORMIN HCL ER 500 MG TAB: 500 | 30 days supply | Qty: 120 | Fill #2

## 2016-05-12 DIAGNOSIS — N6489 Other specified disorders of breast: Secondary | ICD-10-CM | POA: Diagnosis not present

## 2016-05-15 MED FILL — LISINOPRIL 10 MG TABLET: 10 | 30 days supply | Qty: 30 | Fill #1

## 2016-05-15 MED FILL — PRAVASTATIN NA 40 MG TAB: 40 | 30 days supply | Qty: 30 | Fill #1

## 2016-05-15 MED FILL — VICTOZA 18 MG/3 ML INJECT P: 18 | 90 days supply | Qty: 27 | Fill #1

## 2016-05-29 ENCOUNTER — Other Ambulatory Visit: Payer: BLUE CROSS/BLUE SHIELD

## 2016-06-03 ENCOUNTER — Ambulatory Visit: Payer: BLUE CROSS/BLUE SHIELD | Admitting: Endocrinology

## 2016-06-22 ENCOUNTER — Other Ambulatory Visit: Payer: BLUE CROSS/BLUE SHIELD

## 2016-06-22 MED FILL — LISINOPRIL 10 MG TABLET: 10 | 30 days supply | Qty: 30 | Fill #2

## 2016-06-22 MED FILL — METFORMIN HCL ER 500 MG TAB: 500 | 30 days supply | Qty: 120 | Fill #3

## 2016-06-22 MED FILL — PRAVASTATIN NA 40 MG TAB: 40 | 30 days supply | Qty: 30 | Fill #2

## 2016-06-23 ENCOUNTER — Encounter: Payer: Self-pay | Admitting: Endocrinology

## 2016-06-23 ENCOUNTER — Other Ambulatory Visit (INDEPENDENT_AMBULATORY_CARE_PROVIDER_SITE_OTHER): Payer: BLUE CROSS/BLUE SHIELD

## 2016-06-23 DIAGNOSIS — E1165 Type 2 diabetes mellitus with hyperglycemia: Secondary | ICD-10-CM | POA: Diagnosis not present

## 2016-06-23 LAB — COMPREHENSIVE METABOLIC PANEL
ALT: 13 U/L (ref 0–35)
AST: 17 U/L (ref 0–37)
Albumin: 4.1 g/dL (ref 3.5–5.2)
Alkaline Phosphatase: 70 U/L (ref 39–117)
BUN: 12 mg/dL (ref 6–23)
CO2: 32 mEq/L (ref 19–32)
Calcium: 9.3 mg/dL (ref 8.4–10.5)
Chloride: 104 mEq/L (ref 96–112)
Creatinine, Ser: 0.75 mg/dL (ref 0.40–1.20)
GFR: 104.63 mL/min (ref >60.00)
Glucose, Bld: 153 mg/dL — ABNORMAL HIGH (ref 70–99)
Potassium: 4.1 mEq/L (ref 3.5–5.1)
Sodium: 140 mEq/L (ref 135–145)
Total Bilirubin: 0.2 mg/dL (ref 0.2–1.2)
Total Protein: 7.4 g/dL (ref 6.0–8.3)

## 2016-06-23 LAB — HEMOGLOBIN A1C: Hgb A1c MFr Bld: 7.5 % — ABNORMAL HIGH (ref 4.6–6.5)

## 2016-06-25 ENCOUNTER — Ambulatory Visit (INDEPENDENT_AMBULATORY_CARE_PROVIDER_SITE_OTHER): Payer: BLUE CROSS/BLUE SHIELD | Admitting: Endocrinology

## 2016-06-25 ENCOUNTER — Encounter: Payer: Self-pay | Admitting: Endocrinology

## 2016-06-25 VITALS — BP 114/80 | HR 79 | Ht 64.0 in | Wt 158.0 lb

## 2016-06-25 DIAGNOSIS — E1165 Type 2 diabetes mellitus with hyperglycemia: Secondary | ICD-10-CM

## 2016-06-25 DIAGNOSIS — E042 Nontoxic multinodular goiter: Secondary | ICD-10-CM | POA: Diagnosis not present

## 2016-06-25 MED ORDER — GLUCOSE BLOOD VI STRP: ORAL_STRIP | 2 refills | Status: AC

## 2016-06-25 MED ORDER — GLIMEPIRIDE 1 MG PO TABS: 1.0000 mg | ORAL_TABLET | Freq: Every day | ORAL | 2 refills | Status: DC

## 2016-06-25 MED FILL — GLIMEPIRIDE 1 MG TABLET: 1 | 30 days supply | Qty: 30 | Fill #0

## 2016-06-25 NOTE — Patient Instructions (Addendum)
Glimeperide 1/2 twice daily  Check blood sugars on waking up  2x  weekly  Also check blood sugars about 2 hours after a meal and do this after different meals by rotation  Recommended blood sugar levels on waking up is 90-130 and about 2 hours after meal is 130-160  Please bring your blood sugar monitor to each visit, thank you

## 2016-06-25 NOTE — Progress Notes (Signed)
Patient ID: [name], female   DOB: Mar 16, 1966, 51 y.o.   MRN: [id number]   Reason for Appointment: Diabetes follow-up   History of Present Illness   Diagnosis: Type 2 DIABETES MELITUS  Recent history:   Her blood sugars previously had been usually well controlled with using 1.2 mg Victoza along with metformin. Previously was able to also lose some weight with Victoza and this was improving her satiety. She was started on low dose Amaryl, 1 mg in 10/2014   A1c = 7.5, previously 10.3 in 10/17  Current blood sugar patterns, management and problems identified:  She has not checked her blood sugars since December and a test strips she has are expired in 03/2015  However she has lost some weight  She thinks she is trying to walk and exercise more regularly and overall trying to do better with meal planning as previously directed  Her fasting glucose was however high at 153  On her last visit because of higher readings in the afternoon she was told to switch her Amaryl to the morning  She is consistent with her Victoza and is keeping her portions controlled  She takes her metformin ER in split doses, no side effects now     Non-insulin hypoglycemic drugs: Metformin 1.5 g. Amaryl 0.5 mg at supper, Victoza 1.8 mg daily   Side effects from medications:  Nausea with ? Metformin 2g        Monitors blood glucose:  minimal.    Glucometer: One Touch.          Blood Glucose readings at home: None  Hypoglycemia frequency:  none        Meals: 2 meals per day. Largest meal 6:30 pm; some carbohydrates like fruits       Physical activity: exercise: is relatively active at work  Has not seen a dietitian in some time   Wt Readings from Last 3 Encounters:  06/25/16 158 lb (71.7 kg)  03/05/16 161 lb (73 kg)  01/23/16 167 lb (75.8 kg)   Lab Results  Component Value Date   HGBA1C 7.5 (H) 06/23/2016   HGBA1C 10.3 (H) 01/17/2016   HGBA1C 8.8 (H) 09/19/2015   Lab Results    Component Value Date   MICROALBUR 2.0 (H) 09/19/2015   LDLCALC 97 01/17/2016   CREATININE 0.75 06/23/2016   Lab on 06/23/2016  Component Date Value Ref Range Status  . Hgb A1c MFr Bld 06/23/2016 7.5* 4.6 - 6.5 % Final  . Sodium 06/23/2016 140  135 - 145 mEq/L Final  . Potassium 06/23/2016 4.1  3.5 - 5.1 mEq/L Final  . Chloride 06/23/2016 104  96 - 112 mEq/L Final  . CO2 06/23/2016 32  19 - 32 mEq/L Final  . Glucose, Bld 06/23/2016 153* 70 - 99 mg/dL Final  . BUN 06/23/2016 12  6 - 23 mg/dL Final  . Creatinine, Ser 06/23/2016 0.75  0.40 - 1.20 mg/dL Final  . Total Bilirubin 06/23/2016 0.2  0.2 - 1.2 mg/dL Final  . Alkaline Phosphatase 06/23/2016 70  39 - 117 U/L Final  . AST 06/23/2016 17  0 - 37 U/L Final  . ALT 06/23/2016 13  0 - 35 U/L Final  . Total Protein 06/23/2016 7.4  6.0 - 8.3 g/dL Final  . Albumin 06/23/2016 4.1  3.5 - 5.2 g/dL Final  . Calcium 06/23/2016 9.3  8.4 - 10.5 mg/dL Final  . GFR 06/23/2016 104.63  >60.00 mL/min Final   Other problems addressed: See  review of systems   Allergies as of 06/25/2016   No Known Allergies     Medication List       Accurate as of 06/25/16  5:05 PM. Always use your most recent med list.          aspirin 81 MG tablet Take 81 mg by mouth daily.   B-D UF III MINI PEN NEEDLES 31G X 5 MM Misc Generic drug:  Insulin Pen Needle USE 1 TO INJECT VICTOZA   baclofen 10 MG tablet Commonly known as:  LIORESAL   FLUoxetine 20 MG capsule Commonly known as:  PROZAC Take 20 mg by mouth daily.   glimepiride 1 MG tablet Commonly known as:  AMARYL Take 1 tablet (1 mg total) by mouth daily before supper.   glucose blood test strip Commonly known as:  ONETOUCH VERIO Use as instructed   lisinopril 10 MG tablet Commonly known as:  PRINIVIL,ZESTRIL Take 1 tablet (10 mg total) by mouth daily.   lisinopril 10 MG tablet Commonly known as:  PRINIVIL,ZESTRIL TAKE 1 TABLET (10 MG TOTAL) BY MOUTH DAILY.   metFORMIN 500 MG 24 hr  tablet Commonly known as:  GLUCOPHAGE-XR TAKE 4 TABLETS BY MOUTH DAILY WITH SUPPER.   naproxen 250 MG tablet Commonly known as:  NAPROSYN Take 1 tablet (250 mg total) by mouth 2 (two) times daily with a meal.   naproxen 500 MG tablet Commonly known as:  NAPROSYN Take 500 mg by mouth every 12 (twelve) hours.   pravastatin 40 MG tablet Commonly known as:  PRAVACHOL Take 1 tablet (40 mg total) by mouth daily.   VICTOZA 18 MG/3ML Sopn Generic drug:  liraglutide INJECT 1.8 MG AS DIRECTED DAILY       Allergies: No Known Allergies  Past Medical History:  Diagnosis Date  . Chicken pox    CHILDHOOD  . Depression   . Diabetes mellitus without complication ([place])   . Headache(784.0)    migraine  . Hyperlipidemia   . Hypertension   . Hyperthyroidism   . Multinodular goiter 12/28/2012  . Sleep apnea    uses a cpap    Past Surgical History:  Procedure Laterality Date  . ABDOMINAL HYSTERECTOMY    . CHOLECYSTECTOMY    . ROBOT ASSISTED MYOMECTOMY Left 06/02/2012   Procedure: ROBOTIC ASSISTED MYOMECTOMY;  Surgeon: Marvene Staff, MD;  Location: [facility];  Service: Gynecology;  Laterality: Left;  anexal mass is a myoma left side  . ROBOTIC ASSISTED LAPAROSCOPIC LYSIS OF ADHESION N/A 06/02/2012   Procedure: ROBOTIC ASSISTED LAPAROSCOPIC LYSIS OF ADHESION;  Surgeon: Marvene Staff, MD;  Location: [facility];  Service: Gynecology;  Laterality: N/A;  . ROBOTIC ASSISTED SALPINGO OOPHERECTOMY Left 06/02/2012   Procedure: ROBOTIC ASSISTED SALPINGO OOPHORECTOMY;  Surgeon: Marvene Staff, MD;  Location: [facility];  Service: Gynecology;  Laterality: Left;  pelvic washings  . ULNAR NERVE TRANSPOSITION Right 08/15/2013   Procedure: RIGHT ULNAR NERVE IN SITU DECOMPRESSION/POSSIBLE TRANSPOSITION;  Surgeon: Cammie Sickle., MD;  Location: [facility];  Service: Orthopedics;  Laterality: Right;    Family History  Problem Relation Age of Onset  . Arthritis Mother   . Arthritis  Father   . Hyperlipidemia Father   . Diabetes Father   . Hypertension Sister   . Mental illness Sister   . Diabetes Sister   . Diabetes Brother   . Cancer Paternal Aunt     BREAST    Social History:  reports that she has never  smoked. She has never used smokeless tobacco. She reports that she does not drink alcohol or use drugs.  Review of Systems:   Hypertension:  she is on lisinopril 10 mg  for hypertension with good control and followed by PCP  Lipids: Has been on pravastatin for quite some time and her LDL is below 100  Lab Results  Component Value Date   CHOL 166 01/17/2016   HDL 48.10 01/17/2016   LDLCALC 97 01/17/2016   LDLDIRECT 144 (H) 04/02/2006   TRIG 103.0 01/17/2016   CHOLHDL 3 01/17/2016     She has a long-standing history of euthyroid multinodular goiter.  She has been euthyroid   Lab Results  Component Value Date   TSH 0.56 01/17/2016     Examination:   BP 114/80   Pulse 79   Ht 5\' 4"  (1.626 m)   Wt 158 lb (71.7 kg)   SpO2 97%   BMI 27.12 kg/m   Body mass index is 27.12 kg/m.     ASSESSMENT/ PLAN:  Diabetes type 2 With BMI 30 See history of present illness for detailed discussion of current diabetes management, blood sugar patterns and problems identified  Her A1c is now down to 7.5 which is the best she has had been about a year She appears to be benefiting from taking 1.8 mg Victoza which she is taking consistently now Also tolerating metformin ER without diarrhea Since she has not checked her blood sugar at home at all reminded her that she needs to keep up with this regularly Her fasting reading appears to be high in the lab, previously was switched from taking her low-dose Amaryl from evening to the morning Also doing well with controlling her diet and trying to exercise with walking   Recommendations: Since her fasting readings are appearing higher she can increase her Amaryl to half tablet twice a day She will get new test  strips for her One Touch monitor, prescription sent Recheck thyroid levels on the next visit She will call if she has any variability in her blood sugar control     Patient Instructions  Glimeperide 1/2 twice daily  Check blood sugars on waking up  2x  weekly  Also check blood sugars about 2 hours after a meal and do this after different meals by rotation  Recommended blood sugar levels on waking up is 90-130 and about 2 hours after meal is 130-160  Please bring your blood sugar monitor to each visit, thank you     [facility] 06/25/2016, 5:05 PM

## 2016-07-07 ENCOUNTER — Other Ambulatory Visit: Payer: Self-pay

## 2016-07-07 MED ORDER — GLUCOSE BLOOD VI STRP: ORAL_STRIP | 4 refills | Status: DC

## 2016-07-07 MED ORDER — BAYER CONTOUR NEXT MONITOR W/DEVICE KIT: PACK | 1 refills | Status: DC

## 2016-07-28 DIAGNOSIS — E119 Type 2 diabetes mellitus without complications: Secondary | ICD-10-CM | POA: Diagnosis not present

## 2016-08-20 DIAGNOSIS — M7702 Medial epicondylitis, left elbow: Secondary | ICD-10-CM | POA: Diagnosis not present

## 2016-08-20 DIAGNOSIS — G43909 Migraine, unspecified, not intractable, without status migrainosus: Secondary | ICD-10-CM | POA: Diagnosis not present

## 2016-08-20 DIAGNOSIS — M940 Chondrocostal junction syndrome [Tietze]: Secondary | ICD-10-CM | POA: Diagnosis not present

## 2016-08-20 DIAGNOSIS — F321 Major depressive disorder, single episode, moderate: Secondary | ICD-10-CM | POA: Diagnosis not present

## 2016-09-25 ENCOUNTER — Other Ambulatory Visit (INDEPENDENT_AMBULATORY_CARE_PROVIDER_SITE_OTHER): Payer: BLUE CROSS/BLUE SHIELD

## 2016-09-25 DIAGNOSIS — E042 Nontoxic multinodular goiter: Secondary | ICD-10-CM

## 2016-09-25 DIAGNOSIS — E1165 Type 2 diabetes mellitus with hyperglycemia: Secondary | ICD-10-CM

## 2016-09-25 LAB — MICROALBUMIN / CREATININE URINE RATIO
Creatinine,U: 179.7 mg/dL
Microalb Creat Ratio: 1.7 mg/g (ref 0.0–30.0)
Microalb, Ur: 3 mg/dL — ABNORMAL HIGH (ref 0.0–1.9)

## 2016-09-25 LAB — COMPREHENSIVE METABOLIC PANEL
ALT: 12 U/L (ref 0–35)
AST: 16 U/L (ref 0–37)
Albumin: 3.9 g/dL (ref 3.5–5.2)
Alkaline Phosphatase: 68 U/L (ref 39–117)
BUN: 10 mg/dL (ref 6–23)
CO2: 31 mEq/L (ref 19–32)
Calcium: 9.5 mg/dL (ref 8.4–10.5)
Chloride: 103 mEq/L (ref 96–112)
Creatinine, Ser: 0.84 mg/dL (ref 0.40–1.20)
GFR: 91.71 mL/min (ref >60.00)
Glucose, Bld: 139 mg/dL — ABNORMAL HIGH (ref 70–99)
Potassium: 4.5 mEq/L (ref 3.5–5.1)
Sodium: 140 mEq/L (ref 135–145)
Total Bilirubin: 0.4 mg/dL (ref 0.2–1.2)
Total Protein: 7.4 g/dL (ref 6.0–8.3)

## 2016-09-25 LAB — LIPID PANEL
Cholesterol: 195 mg/dL (ref 0–200)
HDL: 48.9 mg/dL (ref >39.00)
LDL Cholesterol: 123 mg/dL — ABNORMAL HIGH (ref 0–99)
NonHDL: 146.57
Total CHOL/HDL Ratio: 4
Triglycerides: 118 mg/dL (ref 0.0–149.0)
VLDL: 23.6 mg/dL (ref 0.0–40.0)

## 2016-09-25 LAB — HEMOGLOBIN A1C: Hgb A1c MFr Bld: 8.2 % — ABNORMAL HIGH (ref 4.6–6.5)

## 2016-09-25 LAB — TSH: TSH: 0.45 u[IU]/mL (ref 0.35–4.50)

## 2016-09-28 ENCOUNTER — Other Ambulatory Visit: Payer: Self-pay | Admitting: Endocrinology

## 2016-09-28 ENCOUNTER — Encounter: Payer: Self-pay | Admitting: Endocrinology

## 2016-09-28 ENCOUNTER — Ambulatory Visit (INDEPENDENT_AMBULATORY_CARE_PROVIDER_SITE_OTHER): Payer: BLUE CROSS/BLUE SHIELD | Admitting: Endocrinology

## 2016-09-28 VITALS — BP 130/76 | HR 79 | Ht 64.0 in | Wt 161.8 lb

## 2016-09-28 DIAGNOSIS — E7800 Pure hypercholesterolemia, unspecified: Secondary | ICD-10-CM | POA: Diagnosis not present

## 2016-09-28 DIAGNOSIS — E042 Nontoxic multinodular goiter: Secondary | ICD-10-CM | POA: Diagnosis not present

## 2016-09-28 DIAGNOSIS — E1165 Type 2 diabetes mellitus with hyperglycemia: Secondary | ICD-10-CM | POA: Diagnosis not present

## 2016-09-28 MED ORDER — INSULIN PEN NEEDLE 31G X 5 MM MISC: 3 refills | Status: DC

## 2016-09-28 MED ORDER — CANAGLIFLOZIN 100 MG PO TABS: ORAL_TABLET | ORAL | 3 refills | Status: DC

## 2016-09-28 MED ORDER — PRAVASTATIN SODIUM 40 MG PO TABS: 40.0000 mg | ORAL_TABLET | Freq: Every day | ORAL | 3 refills | Status: DC

## 2016-09-28 MED FILL — INVOKANA 100 MG TABLET: 100 | 30 days supply | Qty: 30 | Fill #0

## 2016-09-28 MED FILL — METFORMIN HCL ER 500 MG TAB: 500 | 30 days supply | Qty: 120 | Fill #0

## 2016-09-28 MED FILL — UNIFINE PENTIPS 31GX3/16": 31G X 5 MM | 90 days supply | Qty: 100 | Fill #0

## 2016-09-28 MED FILL — UNIFINE PENTIPS 31GX3/16: 31G X 5 MM | 90 days supply | Qty: 100 | Fill #0

## 2016-09-28 MED FILL — PRAVASTATIN NA 40 MG TAB: 40 | 90 days supply | Qty: 90 | Fill #0

## 2016-09-28 NOTE — Patient Instructions (Addendum)
Check blood sugars on waking up  2-3/7  Also check blood sugars about 2 hours after a meal and do this after different meals by rotation  Recommended blood sugar levels on waking up is 90-130 and about 2 hours after meal is 130-160  Please bring your blood sugar monitor to each visit, thank you  Stop Lisinopril with Invokana  Restart Pravachol

## 2016-09-28 NOTE — Progress Notes (Signed)
Patient ID: [name], female   DOB: 12-08-1965, 51 y.o.   MRN: [id number]   Reason for Appointment: Diabetes follow-up   History of Present Illness   Diagnosis: Type 2 DIABETES MELITUS 2009  Recent history:   Her blood sugars previously had been usually well controlled with using 1.2 mg Victoza along with metformin. Previously was able to also lose some weight with Victoza and this was improving her satiety. She was started on low dose Amaryl, 1 mg in 10/2014   A1c is now 8.2, previously 7.5  Current blood sugar patterns, management and problems identified:   She was told to start checking her blood sugars to help with her blood sugar control and identifying blood sugar patterns that she still has not done this and also has not obtained a new test strips to replace are outdated test strips  She has gained a little weight  She was told to take Amaryl half tablet twice a day but she is taking one tablet at dinnertime only since she is not always eating lunch and sometimes not eating dinner  Although she is relatively active at work and occasionally walking she appears to have worsening control  She is also not making choices when she is trying to eat with no meal planning and may get meals or snacks that are not balanced  However lab glucose was only 139 late morning  She is consistent with her Victoza without nausea  She takes her metformin ER in split doses, no side effects now with full doses     Non-insulin hypoglycemic drugs: Metformin 2 g. Amaryl 1 mg at supper, Victoza 1.8 mg daily   Side effects from medications:  Nausea with ? Metformin 2g        Monitors blood glucose:  minimal.    Glucometer: One Touch.          Blood Glucose readings at home: 148, 120 am old  Hypoglycemia frequency:  none        Meals: 2 meals per day. Largest meal 6:30 pm; some carbohydrates like fruits       Physical activity: exercise: is relatively active at work   Diet  11/16  Mostly 1 meal in am   Wt Readings from Last 3 Encounters:  09/28/16 161 lb 12.8 oz (73.4 kg)  06/25/16 158 lb (71.7 kg)  03/05/16 161 lb (73 kg)   Lab Results  Component Value Date   HGBA1C 8.2 (H) 09/25/2016   HGBA1C 7.5 (H) 06/23/2016   HGBA1C 10.3 (H) 01/17/2016   Lab Results  Component Value Date   MICROALBUR 3.0 (H) 09/25/2016   LDLCALC 123 (H) 09/25/2016   CREATININE 0.84 09/25/2016   Lab on 09/25/2016  Component Date Value Ref Range Status  . Hgb A1c MFr Bld 09/25/2016 8.2* 4.6 - 6.5 % Final   Glycemic Control Guidelines for People with Diabetes:Non Diabetic:  <6%Goal of Therapy: <7%Additional Action Suggested:  >8%   . Sodium 09/25/2016 140  135 - 145 mEq/L Final  . Potassium 09/25/2016 4.5  3.5 - 5.1 mEq/L Final  . Chloride 09/25/2016 103  96 - 112 mEq/L Final  . CO2 09/25/2016 31  19 - 32 mEq/L Final  . Glucose, Bld 09/25/2016 139* 70 - 99 mg/dL Final  . BUN 09/25/2016 10  6 - 23 mg/dL Final  . Creatinine, Ser 09/25/2016 0.84  0.40 - 1.20 mg/dL Final  . Total Bilirubin 09/25/2016 0.4  0.2 - 1.2 mg/dL Final  .  Alkaline Phosphatase 09/25/2016 68  39 - 117 U/L Final  . AST 09/25/2016 16  0 - 37 U/L Final  . ALT 09/25/2016 12  0 - 35 U/L Final  . Total Protein 09/25/2016 7.4  6.0 - 8.3 g/dL Final  . Albumin 09/25/2016 3.9  3.5 - 5.2 g/dL Final  . Calcium 09/25/2016 9.5  8.4 - 10.5 mg/dL Final  . GFR 09/25/2016 91.71  >60.00 mL/min Final  . Cholesterol 09/25/2016 195  0 - 200 mg/dL Final   ATP III Classification       Desirable:  < 200 mg/dL               Borderline High:  200 - 239 mg/dL          High:  > = 240 mg/dL  . Triglycerides 09/25/2016 118.0  0.0 - 149.0 mg/dL Final   Normal:  <150 mg/dLBorderline High:  150 - 199 mg/dL  . HDL 09/25/2016 48.90  >39.00 mg/dL Final  . VLDL 09/25/2016 23.6  0.0 - 40.0 mg/dL Final  . LDL Cholesterol 09/25/2016 123* 0 - 99 mg/dL Final  . Total CHOL/HDL Ratio 09/25/2016 4   Final                  Men          Women1/2  Average Risk     3.4          3.3Average Risk          5.0          4.42X Average Risk          9.6          7.13X Average Risk          15.0          11.0                      . NonHDL 09/25/2016 146.57   Final   NOTE:  Non-HDL goal should be 30 mg/dL higher than patient's LDL goal (i.e. LDL goal of < 70 mg/dL, would have non-HDL goal of < 100 mg/dL)  . Microalb, Ur 09/25/2016 3.0* 0.0 - 1.9 mg/dL Final  . Creatinine,U 09/25/2016 179.7  mg/dL Final  . Microalb Creat Ratio 09/25/2016 1.7  0.0 - 30.0 mg/g Final  . TSH 09/25/2016 0.45  0.35 - 4.50 uIU/mL Final   Other problems addressed: See review of systems   Allergies as of 09/28/2016   No Known Allergies     Medication List       Accurate as of 09/28/16  9:17 PM. Always use your most recent med list.          aspirin 81 MG tablet Take 81 mg by mouth daily.   baclofen 10 MG tablet Commonly known as:  LIORESAL   BAYER CONTOUR NEXT MONITOR w/Device Kit Use to test blood sugar daily   canagliflozin 100 MG Tabs tablet Commonly known as:  INVOKANA 1 tablet before breakfast   FLUoxetine 20 MG capsule Commonly known as:  PROZAC Take 20 mg by mouth daily.   glimepiride 1 MG tablet Commonly known as:  AMARYL Take 1 tablet (1 mg total) by mouth daily before supper.   glucose blood test strip Commonly known as:  ONETOUCH VERIO Use as instructed   glucose blood test strip Commonly known as:  BAYER CONTOUR TEST Use to test blood sugar once daily   Insulin Pen Needle 31G  X 5 MM Misc Commonly known as:  B-D UF III MINI PEN NEEDLES USE 1 daily TO INJECT VICTOZA   lisinopril 10 MG tablet Commonly known as:  PRINIVIL,ZESTRIL TAKE 1 TABLET (10 MG TOTAL) BY MOUTH DAILY.   metFORMIN 500 MG 24 hr tablet Commonly known as:  GLUCOPHAGE-XR TAKE 4 TABLETS BY MOUTH DAILY WITH SUPPER.   naproxen 250 MG tablet Commonly known as:  NAPROSYN Take 1 tablet (250 mg total) by mouth 2 (two) times daily with a meal.   naproxen 500 MG  tablet Commonly known as:  NAPROSYN Take 500 mg by mouth every 12 (twelve) hours.   pravastatin 40 MG tablet Commonly known as:  PRAVACHOL Take 1 tablet (40 mg total) by mouth daily.   VICTOZA 18 MG/3ML Sopn Generic drug:  liraglutide INJECT 1.8 MG AS DIRECTED DAILY       Allergies: No Known Allergies  Past Medical History:  Diagnosis Date  . Chicken pox    CHILDHOOD  . Depression   . Diabetes mellitus without complication ([place])   . Headache(784.0)    migraine  . Hyperlipidemia   . Hypertension   . Hyperthyroidism   . Multinodular goiter 12/28/2012  . Sleep apnea    uses a cpap    Past Surgical History:  Procedure Laterality Date  . ABDOMINAL HYSTERECTOMY    . CHOLECYSTECTOMY    . ROBOT ASSISTED MYOMECTOMY Left 06/02/2012   Procedure: ROBOTIC ASSISTED MYOMECTOMY;  Surgeon: Marvene Staff, MD;  Location: [facility];  Service: Gynecology;  Laterality: Left;  anexal mass is a myoma left side  . ROBOTIC ASSISTED LAPAROSCOPIC LYSIS OF ADHESION N/A 06/02/2012   Procedure: ROBOTIC ASSISTED LAPAROSCOPIC LYSIS OF ADHESION;  Surgeon: Marvene Staff, MD;  Location: [facility];  Service: Gynecology;  Laterality: N/A;  . ROBOTIC ASSISTED SALPINGO OOPHERECTOMY Left 06/02/2012   Procedure: ROBOTIC ASSISTED SALPINGO OOPHORECTOMY;  Surgeon: Marvene Staff, MD;  Location: [facility];  Service: Gynecology;  Laterality: Left;  pelvic washings  . ULNAR NERVE TRANSPOSITION Right 08/15/2013   Procedure: RIGHT ULNAR NERVE IN SITU DECOMPRESSION/POSSIBLE TRANSPOSITION;  Surgeon: Cammie Sickle., MD;  Location: [facility];  Service: Orthopedics;  Laterality: Right;    Family History  Problem Relation Age of Onset  . Arthritis Mother   . Arthritis Father   . Hyperlipidemia Father   . Diabetes Father   . Hypertension Sister   . Mental illness Sister   . Diabetes Sister   . Diabetes Brother   . Cancer Paternal Aunt        BREAST    Social History:  reports that she has  never smoked. She has never used smokeless tobacco. She reports that she does not drink alcohol or use drugs.  Review of Systems:   Hypertension:  she is on lisinopril 10 mg  for hypertension with good control   She does not have a functioning monitor at home  Lipids: Has been on pravastatin for quite some time and Recently has run out of this medication and not refilled it causing her LDL to be higher  Lab Results  Component Value Date   CHOL 195 09/25/2016   HDL 48.90 09/25/2016   LDLCALC 123 (H) 09/25/2016   LDLDIRECT 144 (H) 04/02/2006   TRIG 118.0 09/25/2016   CHOLHDL 4 09/25/2016     She has a long-standing history of euthyroid multinodular goiter.  She has been euthyroid Including recently   Lab Results  Component Value Date  TSH 0.45 09/25/2016     Examination:   BP 130/76   Pulse 79   Ht _0  (1.626 m)   Wt 161 lb 12.8 oz (73.4 kg)   SpO2 98%   BMI 27.77 kg/m   Body mass index is 27.77 kg/m.     ASSESSMENT/ PLAN:  Diabetes type 2 With BMI 30 See history of present illness for detailed discussion of current diabetes management, blood sugar patterns and problems identified  Her A1c is higher again over 8% Although she is not eating large portions and is fairly active her blood sugars are more difficult to control now She may be getting some progression of her diabetes and insulin deficiency This is despite taking maximum dose Victoza, metformin and 1 mg Amaryl She has not been motivated to check her sugar and not clear when her blood sugars are high  Recommendations:  She will start 100 mg on Invokana daily  Discussed action of SGLT 2 drugs on lowering glucose by decreasing kidney absorption of glucose, benefits of weight loss and lower blood pressure, possible side effects including candidiasis and dosage regimen, discussed options for treatment candidiasis if needed   Also discussed black box warnings and recent studies overriding the previous  study on lower limb amputations  When her blood sugars are below target she will reduce her Amaryl to at least half a tablet and may consider stopping it  She will leave off her lisinopril for now  She will get new test strips for her One Touch monitor  Discussed blood sugar targets at various times  More consistent diet with meal planning as discussed previously with dietitian, may need to have her go back for follow-up as needed  Follow-up in 6 weeks to make sure she is back on track and blood sugars are controlled  She needs to restart her pravastatin and prescription was sent, discussed importance of long-term control of lipids for risk reduction     Patient Instructions  Check blood sugars on waking up  2-3/7  Also check blood sugars about 2 hours after a meal and do this after different meals by rotation  Recommended blood sugar levels on waking up is 90-130 and about 2 hours after meal is 130-160  Please bring your blood sugar monitor to each visit, thank you  Stop Lisinopril with Invokana  Restart Pravachol   Counseling time on subjects discussed above is over 50% of today's 25 minute visit   Mousa Prout 09/28/2016, 9:17 PM

## 2016-10-01 DIAGNOSIS — G43909 Migraine, unspecified, not intractable, without status migrainosus: Secondary | ICD-10-CM | POA: Diagnosis not present

## 2016-10-01 DIAGNOSIS — M7702 Medial epicondylitis, left elbow: Secondary | ICD-10-CM | POA: Diagnosis not present

## 2016-10-01 DIAGNOSIS — F321 Major depressive disorder, single episode, moderate: Secondary | ICD-10-CM | POA: Diagnosis not present

## 2016-10-01 DIAGNOSIS — I10 Essential (primary) hypertension: Secondary | ICD-10-CM | POA: Diagnosis not present

## 2016-10-01 MED FILL — FLUoxetine HCL 20 MG CAPS: 20 | 30 days supply | Qty: 30 | Fill #0

## 2016-10-01 MED FILL — LISINOPRIL 10 MG TABLET: 10 | 30 days supply | Qty: 30 | Fill #0

## 2016-10-02 ENCOUNTER — Other Ambulatory Visit: Payer: Self-pay | Admitting: Family Medicine

## 2016-10-02 DIAGNOSIS — E042 Nontoxic multinodular goiter: Secondary | ICD-10-CM

## 2016-10-19 ENCOUNTER — Ambulatory Visit
Admission: RE | Admit: 2016-10-19 | Discharge: 2016-10-19 | Disposition: A | Discharge status: Home / Self Care | Payer: BLUE CROSS/BLUE SHIELD | Source: Ambulatory Visit | Attending: Family Medicine | Admitting: Family Medicine

## 2016-10-19 DIAGNOSIS — E042 Nontoxic multinodular goiter: Secondary | ICD-10-CM

## 2016-10-28 HISTORY — PX: COLONOSCOPY: SHX174

## 2016-11-06 ENCOUNTER — Other Ambulatory Visit: Payer: BLUE CROSS/BLUE SHIELD

## 2016-11-10 ENCOUNTER — Ambulatory Visit: Payer: BLUE CROSS/BLUE SHIELD | Admitting: Endocrinology

## 2016-11-17 ENCOUNTER — Other Ambulatory Visit (INDEPENDENT_AMBULATORY_CARE_PROVIDER_SITE_OTHER): Payer: BLUE CROSS/BLUE SHIELD

## 2016-11-17 DIAGNOSIS — E1165 Type 2 diabetes mellitus with hyperglycemia: Secondary | ICD-10-CM | POA: Diagnosis not present

## 2016-11-17 LAB — BASIC METABOLIC PANEL
BUN: 10 mg/dL (ref 6–23)
CO2: 29 mEq/L (ref 19–32)
Calcium: 9.3 mg/dL (ref 8.4–10.5)
Chloride: 105 mEq/L (ref 96–112)
Creatinine, Ser: 0.81 mg/dL (ref 0.40–1.20)
GFR: 95.58 mL/min (ref >60.00)
Glucose, Bld: 137 mg/dL — ABNORMAL HIGH (ref 70–99)
Potassium: 4.2 mEq/L (ref 3.5–5.1)
Sodium: 140 mEq/L (ref 135–145)

## 2016-11-18 LAB — FRUCTOSAMINE: Fructosamine: 301 umol/L — ABNORMAL HIGH (ref 0–285)

## 2016-11-19 MED FILL — FLUoxetine HCL 20 MG CAPS: 20 | 30 days supply | Qty: 30 | Fill #1

## 2016-11-19 MED FILL — LISINOPRIL 10 MG TABLET: 10 | 30 days supply | Qty: 30 | Fill #1

## 2016-11-20 MED FILL — INVOKANA 100 MG TABLET: 100 | 30 days supply | Qty: 30 | Fill #1

## 2016-11-24 ENCOUNTER — Ambulatory Visit (INDEPENDENT_AMBULATORY_CARE_PROVIDER_SITE_OTHER): Payer: BLUE CROSS/BLUE SHIELD | Admitting: Endocrinology

## 2016-11-24 ENCOUNTER — Encounter: Payer: Self-pay | Admitting: Endocrinology

## 2016-11-24 VITALS — BP 128/82 | HR 76 | Ht 64.0 in | Wt 160.6 lb

## 2016-11-24 DIAGNOSIS — E782 Mixed hyperlipidemia: Secondary | ICD-10-CM | POA: Diagnosis not present

## 2016-11-24 DIAGNOSIS — E1165 Type 2 diabetes mellitus with hyperglycemia: Secondary | ICD-10-CM | POA: Diagnosis not present

## 2016-11-24 NOTE — Progress Notes (Signed)
Patient ID: [name], female   DOB: 09-27-1965, 51 y.o.   MRN: [id number]   Reason for Appointment: Diabetes follow-up   History of Present Illness   Diagnosis: Type 2 DIABETES MELITUS 2009  Recent history:   Her blood sugars previously had been usually well controlled with using 1.2 mg Victoza along with metformin. Previously was able to also lose some weight with Victoza and this was improving her satiety. She was started on low dose Amaryl, 1 mg in 10/2014   A1c is recently 8.2, previously 7.5 Fructosamine is 301, higher than 8 months ago  Current blood sugar patterns, management and problems identified:   She was told to start Invokana on her last visit about 8 weeks ago and she hasn't done so  However by mistake she stopped metformin as she did not think she was supposed to take them before  She thinks she can only tolerate 500 mg twice a day of metformin ER more recently  Because of cost she has not refilled her Victoza for 2 weeks  Has only sporadic blood sugars at home with highest reading 170 fasting although it was 137 in the lab     Non-insulin hypoglycemic drugs: Metformin 1 g. Amaryl 1 mg at supper, Victoza 1.8 mg daily   Side effects from medications:  Nausea with ? Metformin 2g        Monitors blood glucose:  minimal.    Glucometer: One Touch.          Blood Glucose readings at home:  She has checked blood sugars only on 5 days Range 113-170 with median 138 with 2 high readings and no readings after evening meal   Hypoglycemia frequency:  none        Meals: 2 meals per day. Largest meal 6:30 pm; some carbohydrates like fruits       Physical activity: exercise: is relatively active at work   Diet 11/16  Mostly 1 meal in am   Wt Readings from Last 3 Encounters:  11/24/16 160 lb 9.6 oz (72.8 kg)  09/28/16 161 lb 12.8 oz (73.4 kg)  06/25/16 158 lb (71.7 kg)   Lab Results  Component Value Date   HGBA1C 8.2 (H) 09/25/2016   HGBA1C 7.5  (H) 06/23/2016   HGBA1C 10.3 (H) 01/17/2016   Lab Results  Component Value Date   MICROALBUR 3.0 (H) 09/25/2016   LDLCALC 123 (H) 09/25/2016   CREATININE 0.81 11/17/2016   No visits with results within 1 Week(s) from this visit.  Latest known visit with results is:  Lab on 11/17/2016  Component Date Value Ref Range Status  . Sodium 11/17/2016 140  135 - 145 mEq/L Final  . Potassium 11/17/2016 4.2  3.5 - 5.1 mEq/L Final  . Chloride 11/17/2016 105  96 - 112 mEq/L Final  . CO2 11/17/2016 29  19 - 32 mEq/L Final  . Glucose, Bld 11/17/2016 137* 70 - 99 mg/dL Final  . BUN 11/17/2016 10  6 - 23 mg/dL Final  . Creatinine, Ser 11/17/2016 0.81  0.40 - 1.20 mg/dL Final  . Calcium 11/17/2016 9.3  8.4 - 10.5 mg/dL Final  . GFR 11/17/2016 95.58  >60.00 mL/min Final  . Fructosamine 11/17/2016 301* 0 - 285 umol/L Final   Comment: Published reference interval for apparently healthy subjects between age 51 and 35 is 47 - 285 umol/L and in a poorly controlled diabetic population is 228 - 563 umol/L with a mean of 396 umol/L.  Other problems addressed: See review of systems     Allergies as of 11/24/2016   No Known Allergies     Medication List       Accurate as of 11/24/16  4:07 PM. Always use your most recent med list.          aspirin 81 MG tablet Take 81 mg by mouth daily.   baclofen 10 MG tablet Commonly known as:  LIORESAL   canagliflozin 100 MG Tabs tablet Commonly known as:  INVOKANA 1 tablet before breakfast   FLUoxetine 20 MG capsule Commonly known as:  PROZAC Take 20 mg by mouth daily.   glimepiride 1 MG tablet Commonly known as:  AMARYL Take 1 tablet (1 mg total) by mouth daily before supper.   glucose blood test strip Commonly known as:  ONETOUCH VERIO Use as instructed   Insulin Pen Needle 31G X 5 MM Misc Commonly known as:  B-D UF III MINI PEN NEEDLES USE 1 daily TO INJECT VICTOZA   lisinopril 10 MG tablet Commonly known as:  PRINIVIL,ZESTRIL TAKE  1 TABLET (10 MG TOTAL) BY MOUTH DAILY.   metFORMIN 500 MG 24 hr tablet Commonly known as:  GLUCOPHAGE-XR TAKE 4 TABLETS BY MOUTH DAILY WITH SUPPER.   naproxen 500 MG tablet Commonly known as:  NAPROSYN Take 500 mg by mouth every 12 (twelve) hours.   pravastatin 40 MG tablet Commonly known as:  PRAVACHOL Take 1 tablet (40 mg total) by mouth daily.   VICTOZA 18 MG/3ML Sopn Generic drug:  liraglutide INJECT 1.8 MG AS DIRECTED DAILY       Allergies: No Known Allergies  Past Medical History:  Diagnosis Date  . Chicken pox    CHILDHOOD  . Depression   . Diabetes mellitus without complication ([place])   . Headache(784.0)    migraine  . Hyperlipidemia   . Hypertension   . Hyperthyroidism   . Multinodular goiter 12/28/2012  . Sleep apnea    uses a cpap    Past Surgical History:  Procedure Laterality Date  . ABDOMINAL HYSTERECTOMY    . CHOLECYSTECTOMY    . ROBOT ASSISTED MYOMECTOMY Left 06/02/2012   Procedure: ROBOTIC ASSISTED MYOMECTOMY;  Surgeon: Marvene Staff, MD;  Location: [facility];  Service: Gynecology;  Laterality: Left;  anexal mass is a myoma left side  . ROBOTIC ASSISTED LAPAROSCOPIC LYSIS OF ADHESION N/A 06/02/2012   Procedure: ROBOTIC ASSISTED LAPAROSCOPIC LYSIS OF ADHESION;  Surgeon: Marvene Staff, MD;  Location: [facility];  Service: Gynecology;  Laterality: N/A;  . ROBOTIC ASSISTED SALPINGO OOPHERECTOMY Left 06/02/2012   Procedure: ROBOTIC ASSISTED SALPINGO OOPHORECTOMY;  Surgeon: Marvene Staff, MD;  Location: [facility];  Service: Gynecology;  Laterality: Left;  pelvic washings  . ULNAR NERVE TRANSPOSITION Right 08/15/2013   Procedure: RIGHT ULNAR NERVE IN SITU DECOMPRESSION/POSSIBLE TRANSPOSITION;  Surgeon: Cammie Sickle., MD;  Location: [facility];  Service: Orthopedics;  Laterality: Right;    Family History  Problem Relation Age of Onset  . Arthritis Mother   . Arthritis Father   . Hyperlipidemia Father   . Diabetes Father   .  Hypertension Sister   . Mental illness Sister   . Diabetes Sister   . Diabetes Brother   . Cancer Paternal Aunt        BREAST    Social History:  reports that she has never smoked. She has never used smokeless tobacco. She reports that she does not drink alcohol or use drugs.  Review of Systems:   Hypertension:  she is on lisinopril 10 mg irregularly for hypertension  Although she was told to stop this because of starting Invokana she is taking this every other day, she thinks she feels lightheaded with taking it daily The home monitoring  BP Readings from Last 3 Encounters:  11/24/16 128/82  09/28/16 130/76  06/25/16 114/80     Lipids: Has been on pravastatin for quite some time and had run out of this medication and Had not refilled it causing her LDL to be higher  Lab Results  Component Value Date   CHOL 195 09/25/2016   HDL 48.90 09/25/2016   LDLCALC 123 (H) 09/25/2016   LDLDIRECT 144 (H) 04/02/2006   TRIG 118.0 09/25/2016   CHOLHDL 4 09/25/2016     She has a long-standing history of euthyroid multinodular goiter.  She has been euthyroid  However she complains of feeling sleepy   Lab Results  Component Value Date   TSH 0.45 09/25/2016     Examination:   BP 128/82   Pulse 76   Ht 5\' 4"  (1.626 m)   Wt 160 lb 9.6 oz (72.8 kg)   SpO2 98%   BMI 27.57 kg/m   Body mass index is 27.57 kg/m.     ASSESSMENT/ PLAN:  Diabetes type 2 With BMI 30 See history of present illness for detailed discussion of current diabetes management, blood sugar patterns and problems identified  Her blood sugars are probably somewhat better with adding Invokana although she now is not taking metformin only mistake Also has not refilled Victoza for 2 weeks Without getting adequate blood sugar monitoring difficult to know what her blood sugar patterns are Fructosamine indicates still some increase in blood sugar levels    Recommendations:  She will start Back on Victoza as  before  She will also start back on metformin and continue the 500 mg twice a day that she was doing  Consistent diet and exercise regimen  She will try to check blood sugars more often especially at bedtime  A1c on the next visit  Hypertension: Discussed that she does not need to take lisinopril every other day and since she is on Invokana she can leave that off for now, we'll continue to follow on the office or with PCP   Lipids: Will need follow-up on the next visit   Patient Instructions  Metformin 1 twice daily  Stop Lisinopril  Check blood sugars on waking up  3/7  Also check blood sugars about 2 hours after a meal and do this after different meals by rotation  Recommended blood sugar levels on waking up is 90-130 and about 2 hours after meal is 130-160  Please bring your blood sugar monitor to each visit, thank you       [facility] 11/24/2016, 4:07 PM

## 2016-11-24 NOTE — Patient Instructions (Addendum)
Metformin 1 twice daily  Stop Lisinopril  Check blood sugars on waking up  3/7  Also check blood sugars about 2 hours after a meal and do this after different meals by rotation  Recommended blood sugar levels on waking up is 90-130 and about 2 hours after meal is 130-160  Please bring your blood sugar monitor to each visit, thank you

## 2016-12-02 DIAGNOSIS — E785 Hyperlipidemia, unspecified: Secondary | ICD-10-CM | POA: Diagnosis not present

## 2016-12-02 DIAGNOSIS — F321 Major depressive disorder, single episode, moderate: Secondary | ICD-10-CM | POA: Diagnosis not present

## 2016-12-02 DIAGNOSIS — E119 Type 2 diabetes mellitus without complications: Secondary | ICD-10-CM | POA: Diagnosis not present

## 2016-12-02 DIAGNOSIS — I10 Essential (primary) hypertension: Secondary | ICD-10-CM | POA: Diagnosis not present

## 2016-12-02 MED FILL — MELOXICAM 7.5 MG TABLET: 7.5 | 30 days supply | Qty: 30 | Fill #0

## 2016-12-02 MED FILL — FLUoxetine HCL 40 MG CAPS: 40 | 30 days supply | Qty: 30 | Fill #0

## 2016-12-02 MED FILL — SUMATRIPTAN SUCC 50 MG TAB: 50 | 30 days supply | Qty: 9 | Fill #0

## 2017-01-13 MED FILL — POLYETHYLENE GLYCOL 3350: 1 days supply | Qty: 255 | Fill #0

## 2017-01-15 DIAGNOSIS — Z1211 Encounter for screening for malignant neoplasm of colon: Secondary | ICD-10-CM | POA: Diagnosis not present

## 2017-01-22 ENCOUNTER — Other Ambulatory Visit: Payer: BLUE CROSS/BLUE SHIELD

## 2017-01-25 ENCOUNTER — Ambulatory Visit: Payer: BLUE CROSS/BLUE SHIELD | Admitting: Endocrinology

## 2017-01-29 ENCOUNTER — Other Ambulatory Visit: Payer: Self-pay | Admitting: Endocrinology

## 2017-01-29 MED FILL — FLUoxetine HCL 40 MG CAPS: 40 | 30 days supply | Qty: 30 | Fill #1

## 2017-01-29 MED FILL — METFORMIN HCL ER 500 MG TAB: 500 | 30 days supply | Qty: 120 | Fill #1

## 2017-01-29 MED FILL — PRAVASTATIN NA 40 MG TAB: 40 | 90 days supply | Qty: 90 | Fill #1

## 2017-01-29 MED FILL — INVOKANA 100 MG TABLET: 100 | 30 days supply | Qty: 30 | Fill #2

## 2017-01-29 MED FILL — LISINOPRIL 10 MG TABS: 10 | 30 days supply | Qty: 30 | Fill #2

## 2017-01-29 MED FILL — MELOXICAM 7.5 MG TABLET: 7.5 | 30 days supply | Qty: 30 | Fill #1

## 2017-01-29 MED FILL — VICTOZA 18 MG/3 ML INJECT P: 18 | 90 days supply | Qty: 27 | Fill #0

## 2017-02-02 DIAGNOSIS — G43909 Migraine, unspecified, not intractable, without status migrainosus: Secondary | ICD-10-CM | POA: Diagnosis not present

## 2017-02-02 DIAGNOSIS — M7702 Medial epicondylitis, left elbow: Secondary | ICD-10-CM | POA: Diagnosis not present

## 2017-02-02 DIAGNOSIS — F321 Major depressive disorder, single episode, moderate: Secondary | ICD-10-CM | POA: Diagnosis not present

## 2017-02-02 DIAGNOSIS — R5383 Other fatigue: Secondary | ICD-10-CM | POA: Diagnosis not present

## 2017-02-02 MED FILL — DULoxetine HCL 60 MG CPEP: 60 | 30 days supply | Qty: 30 | Fill #0

## 2017-02-23 MED FILL — VIT D2 1.25 MG (50,000 UNIT: 1.25 MG | 84 days supply | Qty: 12 | Fill #0

## 2017-03-02 NOTE — Progress Notes (Deleted)
Patient ID: [name], female   DOB: Mar 11, 1966, 51 y.o.   MRN: [id number]   Reason for Appointment: Diabetes follow-up   History of Present Illness   Diagnosis: Type 2 DIABETES MELITUS 2009  Recent history:   Her blood sugars previously had been usually well controlled with using 1.2 mg Victoza along with metformin. Previously was able to also lose some weight with Victoza and this was improving her satiety. She was started on low dose Amaryl, 1 mg in 10/2014   A1c is recently 8.2, previously 7.5 Fructosamine is 301, higher than 8 months ago  Current blood sugar patterns, management and problems identified:   She was told to start Invokana on her last visit about 8 weeks ago and she hasn't done so  However by mistake she stopped metformin as she did not think she was supposed to take them before  She thinks she can only tolerate 500 mg twice a day of metformin ER more recently  Because of cost she has not refilled her Victoza for 2 weeks  Has only sporadic blood sugars at home with highest reading 170 fasting although it was 137 in the lab     Non-insulin hypoglycemic drugs: Metformin 1 g. Amaryl 1 mg at supper, Victoza 1.8 mg daily   Side effects from medications:  Nausea with ? Metformin 2g        Monitors blood glucose:  minimal.    Glucometer: One Touch.          Blood Glucose readings at home:  She has checked blood sugars only on 5 days Range 113-170 with median 138 with 2 high readings and no readings after evening meal   Hypoglycemia frequency:  none        Meals: 2 meals per day. Largest meal 6:30 pm; some carbohydrates like fruits       Physical activity: exercise: is relatively active at work   Diet 11/16  Mostly 1 meal in am   Wt Readings from Last 3 Encounters:  11/24/16 160 lb 9.6 oz (72.8 kg)  09/28/16 161 lb 12.8 oz (73.4 kg)  06/25/16 158 lb (71.7 kg)   Lab Results  Component Value Date   HGBA1C 8.2 (H) 09/25/2016   HGBA1C 7.5  (H) 06/23/2016   HGBA1C 10.3 (H) 01/17/2016   Lab Results  Component Value Date   MICROALBUR 3.0 (H) 09/25/2016   LDLCALC 123 (H) 09/25/2016   CREATININE 0.81 11/17/2016   No visits with results within 1 Week(s) from this visit.  Latest known visit with results is:  Lab on 11/17/2016  Component Date Value Ref Range Status  . Sodium 11/17/2016 140  135 - 145 mEq/L Final  . Potassium 11/17/2016 4.2  3.5 - 5.1 mEq/L Final  . Chloride 11/17/2016 105  96 - 112 mEq/L Final  . CO2 11/17/2016 29  19 - 32 mEq/L Final  . Glucose, Bld 11/17/2016 137* 70 - 99 mg/dL Final  . BUN 11/17/2016 10  6 - 23 mg/dL Final  . Creatinine, Ser 11/17/2016 0.81  0.40 - 1.20 mg/dL Final  . Calcium 11/17/2016 9.3  8.4 - 10.5 mg/dL Final  . GFR 11/17/2016 95.58  >60.00 mL/min Final  . Fructosamine 11/17/2016 301* 0 - 285 umol/L Final   Comment: Published reference interval for apparently healthy subjects between age 62 and 56 is 47 - 285 umol/L and in a poorly controlled diabetic population is 228 - 563 umol/L with a mean of 396 umol/L.  Other problems addressed: See review of systems     Allergies as of 03/03/2017   No Known Allergies     Medication List        Accurate as of 03/02/17  9:22 PM. Always use your most recent med list.          aspirin 81 MG tablet Take 81 mg by mouth daily.   baclofen 10 MG tablet Commonly known as:  LIORESAL   canagliflozin 100 MG Tabs tablet Commonly known as:  INVOKANA 1 tablet before breakfast   FLUoxetine 20 MG capsule Commonly known as:  PROZAC Take 20 mg by mouth daily.   glimepiride 1 MG tablet Commonly known as:  AMARYL Take 1 tablet (1 mg total) by mouth daily before supper.   glucose blood test strip Commonly known as:  ONETOUCH VERIO Use as instructed   Insulin Pen Needle 31G X 5 MM Misc Commonly known as:  B-D UF III MINI PEN NEEDLES USE 1 daily TO INJECT VICTOZA   lisinopril 10 MG tablet Commonly known as:   PRINIVIL,ZESTRIL TAKE 1 TABLET (10 MG TOTAL) BY MOUTH DAILY.   metFORMIN 500 MG 24 hr tablet Commonly known as:  GLUCOPHAGE-XR TAKE 4 TABLETS BY MOUTH DAILY WITH SUPPER.   naproxen 500 MG tablet Commonly known as:  NAPROSYN Take 500 mg by mouth every 12 (twelve) hours.   pravastatin 40 MG tablet Commonly known as:  PRAVACHOL Take 1 tablet (40 mg total) by mouth daily.   VICTOZA 18 MG/3ML Sopn Generic drug:  liraglutide INJECT 1.8 MG AS DIRECTED DAILY       Allergies: No Known Allergies  Past Medical History:  Diagnosis Date  . Chicken pox    CHILDHOOD  . Depression   . Diabetes mellitus without complication ([place])   . Headache(784.0)    migraine  . Hyperlipidemia   . Hypertension   . Hyperthyroidism   . Multinodular goiter 12/28/2012  . Sleep apnea    uses a cpap    Past Surgical History:  Procedure Laterality Date  . ABDOMINAL HYSTERECTOMY    . CHOLECYSTECTOMY    . ROBOT ASSISTED MYOMECTOMY Left 06/02/2012   Procedure: ROBOTIC ASSISTED MYOMECTOMY;  Surgeon: Marvene Staff, MD;  Location: [facility];  Service: Gynecology;  Laterality: Left;  anexal mass is a myoma left side  . ROBOTIC ASSISTED LAPAROSCOPIC LYSIS OF ADHESION N/A 06/02/2012   Procedure: ROBOTIC ASSISTED LAPAROSCOPIC LYSIS OF ADHESION;  Surgeon: Marvene Staff, MD;  Location: [facility];  Service: Gynecology;  Laterality: N/A;  . ROBOTIC ASSISTED SALPINGO OOPHERECTOMY Left 06/02/2012   Procedure: ROBOTIC ASSISTED SALPINGO OOPHORECTOMY;  Surgeon: Marvene Staff, MD;  Location: [facility];  Service: Gynecology;  Laterality: Left;  pelvic washings  . ULNAR NERVE TRANSPOSITION Right 08/15/2013   Procedure: RIGHT ULNAR NERVE IN SITU DECOMPRESSION/POSSIBLE TRANSPOSITION;  Surgeon: Cammie Sickle., MD;  Location: [facility];  Service: Orthopedics;  Laterality: Right;    Family History  Problem Relation Age of Onset  . Arthritis Mother   . Arthritis Father   . Hyperlipidemia Father   .  Diabetes Father   . Hypertension Sister   . Mental illness Sister   . Diabetes Sister   . Diabetes Brother   . Cancer Paternal Aunt        BREAST    Social History:  reports that  has never smoked. she has never used smokeless tobacco. She reports that she does not drink alcohol or use drugs.  Review of Systems:   Hypertension:  she is on lisinopril 10 mg irregularly for hypertension  Although she was told to stop this because of starting Invokana she is taking this every other day, she thinks she feels lightheaded with taking it daily The home monitoring  BP Readings from Last 3 Encounters:  11/24/16 128/82  09/28/16 130/76  06/25/16 114/80     Lipids: Has been on pravastatin for quite some time and had run out of this medication and Had not refilled it causing her LDL to be higher  Lab Results  Component Value Date   CHOL 195 09/25/2016   HDL 48.90 09/25/2016   LDLCALC 123 (H) 09/25/2016   LDLDIRECT 144 (H) 04/02/2006   TRIG 118.0 09/25/2016   CHOLHDL 4 09/25/2016     She has a long-standing history of euthyroid multinodular goiter.  She has been euthyroid  However she complains of feeling sleepy   Lab Results  Component Value Date   TSH 0.45 09/25/2016     Examination:   There were no vitals taken for this visit.  There is no height or weight on file to calculate BMI.     ASSESSMENT/ PLAN:  Diabetes type 2 With BMI 30 See history of present illness for detailed discussion of current diabetes management, blood sugar patterns and problems identified  Her blood sugars are probably somewhat better with adding Invokana although she now is not taking metformin only mistake Also has not refilled Victoza for 2 weeks Without getting adequate blood sugar monitoring difficult to know what her blood sugar patterns are Fructosamine indicates still some increase in blood sugar levels    Recommendations:  She will start Back on Victoza as before  She will also  start back on metformin and continue the 500 mg twice a day that she was doing  Consistent diet and exercise regimen  She will try to check blood sugars more often especially at bedtime  A1c on the next visit  Hypertension: Discussed that she does not need to take lisinopril every other day and since she is on Invokana she can leave that off for now, we'll continue to follow on the office or with PCP   Lipids: Will need follow-up on the next visit   There are no Patient Instructions on file for this visit.   Calianne Larue 03/02/2017, 9:22 PM

## 2017-03-03 ENCOUNTER — Ambulatory Visit: Payer: BLUE CROSS/BLUE SHIELD | Admitting: Endocrinology

## 2017-03-03 DIAGNOSIS — G4733 Obstructive sleep apnea (adult) (pediatric): Secondary | ICD-10-CM | POA: Diagnosis not present

## 2017-03-11 MED FILL — FLUoxetine HCL 40 MG CAPS: 40 | 30 days supply | Qty: 30 | Fill #2

## 2017-03-11 MED FILL — INVOKANA 100 MG TABLET: 100 | 30 days supply | Qty: 30 | Fill #3

## 2017-03-11 MED FILL — METFORMIN HCL ER 500 MG TAB: 500 | 30 days supply | Qty: 120 | Fill #2

## 2017-04-20 ENCOUNTER — Other Ambulatory Visit: Payer: Self-pay | Admitting: Endocrinology

## 2017-04-20 MED FILL — FLUoxetine HCL 40 MG CAPS: 40 | 30 days supply | Qty: 30 | Fill #3

## 2017-04-20 MED FILL — MELOXICAM 7.5 MG TABLET: 7.5 | 30 days supply | Qty: 30 | Fill #2

## 2017-04-20 MED FILL — LISINOPRIL 10 MG TABS: 10 | 30 days supply | Qty: 30 | Fill #3

## 2017-04-20 MED FILL — INVOKANA 100 MG TABLET: 100 | 30 days supply | Qty: 30 | Fill #0

## 2017-04-27 DIAGNOSIS — T23511A Corrosion of first degree of right thumb (nail), initial encounter: Secondary | ICD-10-CM | POA: Diagnosis not present

## 2017-04-30 DIAGNOSIS — E611 Iron deficiency: Secondary | ICD-10-CM

## 2017-04-30 HISTORY — DX: Iron deficiency: E61.1

## 2017-05-07 DIAGNOSIS — E559 Vitamin D deficiency, unspecified: Secondary | ICD-10-CM | POA: Diagnosis not present

## 2017-05-07 DIAGNOSIS — M7702 Medial epicondylitis, left elbow: Secondary | ICD-10-CM | POA: Diagnosis not present

## 2017-05-07 DIAGNOSIS — G43909 Migraine, unspecified, not intractable, without status migrainosus: Secondary | ICD-10-CM | POA: Diagnosis not present

## 2017-05-07 DIAGNOSIS — F321 Major depressive disorder, single episode, moderate: Secondary | ICD-10-CM | POA: Diagnosis not present

## 2017-05-07 MED FILL — DULoxetine HCL 60 MG CPEP: 60 | 30 days supply | Qty: 30 | Fill #0

## 2017-05-07 MED FILL — TOPIRAMATE 25 MG TABLET: 25 | 30 days supply | Qty: 60 | Fill #0

## 2017-05-14 DIAGNOSIS — Z1231 Encounter for screening mammogram for malignant neoplasm of breast: Secondary | ICD-10-CM | POA: Diagnosis not present

## 2017-05-20 ENCOUNTER — Ambulatory Visit
Admission: RE | Admit: 2017-05-20 | Discharge: 2017-05-20 | Disposition: A | Discharge status: Home / Self Care | Payer: BLUE CROSS/BLUE SHIELD | Source: Ambulatory Visit | Attending: Family Medicine | Admitting: Family Medicine

## 2017-05-20 ENCOUNTER — Other Ambulatory Visit: Payer: Self-pay | Admitting: Family Medicine

## 2017-05-20 DIAGNOSIS — R319 Hematuria, unspecified: Secondary | ICD-10-CM

## 2017-05-20 DIAGNOSIS — M545 Low back pain, unspecified: Secondary | ICD-10-CM

## 2017-05-20 DIAGNOSIS — N1330 Unspecified hydronephrosis: Secondary | ICD-10-CM | POA: Diagnosis not present

## 2017-05-21 ENCOUNTER — Encounter (HOSPITAL_COMMUNITY)
Admission: EM | Disposition: A | Discharge status: Home / Self Care | Payer: Self-pay | Source: Home / Self Care | Attending: Internal Medicine

## 2017-05-21 ENCOUNTER — Inpatient Hospital Stay (HOSPITAL_COMMUNITY)
Admission: EM | Admit: 2017-05-21 | Discharge: 2017-05-23 | DRG: 854 | Disposition: A | Discharge status: Home / Self Care | Payer: BLUE CROSS/BLUE SHIELD | Attending: Internal Medicine | Admitting: Internal Medicine

## 2017-05-21 ENCOUNTER — Encounter (HOSPITAL_COMMUNITY): Payer: Self-pay | Admitting: Emergency Medicine

## 2017-05-21 ENCOUNTER — Emergency Department (HOSPITAL_COMMUNITY): Payer: BLUE CROSS/BLUE SHIELD

## 2017-05-21 ENCOUNTER — Emergency Department (HOSPITAL_COMMUNITY): Payer: BLUE CROSS/BLUE SHIELD | Admitting: Anesthesiology

## 2017-05-21 ENCOUNTER — Ambulatory Visit: Admit: 2017-05-21 | Payer: BLUE CROSS/BLUE SHIELD | Admitting: Urology

## 2017-05-21 ENCOUNTER — Other Ambulatory Visit: Payer: Self-pay

## 2017-05-21 ENCOUNTER — Inpatient Hospital Stay (HOSPITAL_COMMUNITY): Payer: BLUE CROSS/BLUE SHIELD

## 2017-05-21 DIAGNOSIS — N136 Pyonephrosis: Secondary | ICD-10-CM | POA: Diagnosis not present

## 2017-05-21 DIAGNOSIS — Z90710 Acquired absence of both cervix and uterus: Secondary | ICD-10-CM | POA: Diagnosis not present

## 2017-05-21 DIAGNOSIS — A419 Sepsis, unspecified organism: Secondary | ICD-10-CM | POA: Diagnosis not present

## 2017-05-21 DIAGNOSIS — Z833 Family history of diabetes mellitus: Secondary | ICD-10-CM

## 2017-05-21 DIAGNOSIS — R509 Fever, unspecified: Secondary | ICD-10-CM | POA: Diagnosis not present

## 2017-05-21 DIAGNOSIS — I4581 Long QT syndrome: Secondary | ICD-10-CM | POA: Diagnosis not present

## 2017-05-21 DIAGNOSIS — N139 Obstructive and reflux uropathy, unspecified: Secondary | ICD-10-CM | POA: Diagnosis present

## 2017-05-21 DIAGNOSIS — Z7982 Long term (current) use of aspirin: Secondary | ICD-10-CM | POA: Diagnosis not present

## 2017-05-21 DIAGNOSIS — Z8249 Family history of ischemic heart disease and other diseases of the circulatory system: Secondary | ICD-10-CM | POA: Diagnosis not present

## 2017-05-21 DIAGNOSIS — N200 Calculus of kidney: Secondary | ICD-10-CM

## 2017-05-21 DIAGNOSIS — Z8619 Personal history of other infectious and parasitic diseases: Secondary | ICD-10-CM

## 2017-05-21 DIAGNOSIS — E119 Type 2 diabetes mellitus without complications: Secondary | ICD-10-CM | POA: Diagnosis not present

## 2017-05-21 DIAGNOSIS — I10 Essential (primary) hypertension: Secondary | ICD-10-CM | POA: Diagnosis not present

## 2017-05-21 DIAGNOSIS — N201 Calculus of ureter: Secondary | ICD-10-CM | POA: Diagnosis present

## 2017-05-21 DIAGNOSIS — Z818 Family history of other mental and behavioral disorders: Secondary | ICD-10-CM

## 2017-05-21 DIAGNOSIS — N12 Tubulo-interstitial nephritis, not specified as acute or chronic: Secondary | ICD-10-CM

## 2017-05-21 DIAGNOSIS — Z79899 Other long term (current) drug therapy: Secondary | ICD-10-CM

## 2017-05-21 DIAGNOSIS — Z466 Encounter for fitting and adjustment of urinary device: Secondary | ICD-10-CM | POA: Diagnosis not present

## 2017-05-21 DIAGNOSIS — F329 Major depressive disorder, single episode, unspecified: Secondary | ICD-10-CM | POA: Diagnosis not present

## 2017-05-21 DIAGNOSIS — E876 Hypokalemia: Secondary | ICD-10-CM | POA: Diagnosis present

## 2017-05-21 DIAGNOSIS — E0520 Thyrotoxicosis with toxic multinodular goiter without thyrotoxic crisis or storm: Secondary | ICD-10-CM | POA: Diagnosis present

## 2017-05-21 DIAGNOSIS — G4730 Sleep apnea, unspecified: Secondary | ICD-10-CM | POA: Diagnosis not present

## 2017-05-21 DIAGNOSIS — R109 Unspecified abdominal pain: Secondary | ICD-10-CM | POA: Diagnosis not present

## 2017-05-21 DIAGNOSIS — Z8349 Family history of other endocrine, nutritional and metabolic diseases: Secondary | ICD-10-CM

## 2017-05-21 DIAGNOSIS — E785 Hyperlipidemia, unspecified: Secondary | ICD-10-CM | POA: Diagnosis present

## 2017-05-21 DIAGNOSIS — R000 Tachycardia, unspecified: Secondary | ICD-10-CM | POA: Diagnosis not present

## 2017-05-21 DIAGNOSIS — D509 Iron deficiency anemia, unspecified: Secondary | ICD-10-CM | POA: Diagnosis not present

## 2017-05-21 DIAGNOSIS — N138 Other obstructive and reflux uropathy: Secondary | ICD-10-CM | POA: Diagnosis not present

## 2017-05-21 DIAGNOSIS — Z791 Long term (current) use of non-steroidal anti-inflammatories (NSAID): Secondary | ICD-10-CM

## 2017-05-21 DIAGNOSIS — Z7984 Long term (current) use of oral hypoglycemic drugs: Secondary | ICD-10-CM

## 2017-05-21 DIAGNOSIS — N390 Urinary tract infection, site not specified: Secondary | ICD-10-CM | POA: Diagnosis not present

## 2017-05-21 HISTORY — PX: CYSTOSCOPY W/ URETERAL STENT PLACEMENT: SHX1429

## 2017-05-21 LAB — COMPREHENSIVE METABOLIC PANEL
ALT: 36 U/L (ref 14–54)
AST: 33 U/L (ref 15–41)
Albumin: 3.5 g/dL (ref 3.5–5.0)
Alkaline Phosphatase: 119 U/L (ref 38–126)
Anion gap: 17 — ABNORMAL HIGH (ref 5–15)
BUN: 9 mg/dL (ref 6–20)
CO2: 23 mmol/L (ref 22–32)
Calcium: 9.2 mg/dL (ref 8.9–10.3)
Chloride: 94 mmol/L — ABNORMAL LOW (ref 101–111)
Creatinine, Ser: 0.92 mg/dL (ref 0.44–1.00)
GFR calc Af Amer: >60 mL/min (ref >60)
GFR calc non Af Amer: >60 mL/min (ref >60)
Glucose, Bld: 169 mg/dL — ABNORMAL HIGH (ref 65–99)
Potassium: 3.3 mmol/L — ABNORMAL LOW (ref 3.5–5.1)
Sodium: 134 mmol/L — ABNORMAL LOW (ref 135–145)
Total Bilirubin: 0.7 mg/dL (ref 0.3–1.2)
Total Protein: 8.1 g/dL (ref 6.5–8.1)

## 2017-05-21 LAB — CBC WITH DIFFERENTIAL/PLATELET
Basophils Absolute: 0 10*3/uL (ref 0.0–0.1)
Basophils Relative: 0 %
Eosinophils Absolute: 0 10*3/uL (ref 0.0–0.7)
Eosinophils Relative: 0 %
HCT: 37.5 % (ref 36.0–46.0)
Hemoglobin: 12.1 g/dL (ref 12.0–15.0)
Lymphocytes Relative: 10 %
Lymphs Abs: 1.1 10*3/uL (ref 0.7–4.0)
MCH: 23.2 pg — ABNORMAL LOW (ref 26.0–34.0)
MCHC: 32.3 g/dL (ref 30.0–36.0)
MCV: 72 fL — ABNORMAL LOW (ref 78.0–100.0)
Monocytes Absolute: 1.4 10*3/uL — ABNORMAL HIGH (ref 0.1–1.0)
Monocytes Relative: 13 %
Neutro Abs: 8.1 10*3/uL — ABNORMAL HIGH (ref 1.7–7.7)
Neutrophils Relative %: 77 %
Platelets: 383 10*3/uL (ref 150–400)
RBC: 5.21 MIL/uL — ABNORMAL HIGH (ref 3.87–5.11)
RDW: 14.4 % (ref 11.5–15.5)
WBC: 10.6 10*3/uL — ABNORMAL HIGH (ref 4.0–10.5)

## 2017-05-21 LAB — I-STAT CHEM 8, ED
BUN: 7 mg/dL (ref 6–20)
Calcium, Ion: 1.09 mmol/L — ABNORMAL LOW (ref 1.15–1.40)
Chloride: 96 mmol/L — ABNORMAL LOW (ref 101–111)
Creatinine, Ser: 0.9 mg/dL (ref 0.44–1.00)
Glucose, Bld: 164 mg/dL — ABNORMAL HIGH (ref 65–99)
HCT: 42 % (ref 36.0–46.0)
Hemoglobin: 14.3 g/dL (ref 12.0–15.0)
Potassium: 3.3 mmol/L — ABNORMAL LOW (ref 3.5–5.1)
Sodium: 134 mmol/L — ABNORMAL LOW (ref 135–145)
TCO2: 27 mmol/L (ref 22–32)

## 2017-05-21 LAB — URINALYSIS, ROUTINE W REFLEX MICROSCOPIC
Bilirubin Urine: NEGATIVE
Glucose, UA: >=500 mg/dL — AB
Ketones, ur: 5 mg/dL — AB
Nitrite: NEGATIVE
Protein, ur: 30 mg/dL — AB
Specific Gravity, Urine: 1.022 (ref 1.005–1.030)
pH: 5 (ref 5.0–8.0)

## 2017-05-21 LAB — GLUCOSE, CAPILLARY
Glucose-Capillary: 127 mg/dL — ABNORMAL HIGH (ref 65–99)
Glucose-Capillary: 164 mg/dL — ABNORMAL HIGH (ref 65–99)
Glucose-Capillary: 171 mg/dL — ABNORMAL HIGH (ref 65–99)

## 2017-05-21 LAB — PREGNANCY, URINE: Preg Test, Ur: NEGATIVE

## 2017-05-21 LAB — I-STAT CG4 LACTIC ACID, ED: Lactic Acid, Venous: 1.8 mmol/L (ref 0.5–1.9)

## 2017-05-21 SURGERY — CYSTOSCOPY, WITH RETROGRADE PYELOGRAM AND URETERAL STENT INSERTION
Anesthesia: General | Laterality: Left

## 2017-05-21 MED ORDER — PHENYLEPHRINE 40 MCG/ML (10ML) SYRINGE FOR IV PUSH (FOR BLOOD PRESSURE SUPPORT)
PREFILLED_SYRINGE | INTRAVENOUS | Status: AC
  Filled 2017-05-21: qty 10

## 2017-05-21 MED ORDER — MIDAZOLAM HCL 5 MG/5ML IJ SOLN
INTRAMUSCULAR | Status: DC | PRN
  Administered 2017-05-21 (×2): 1 mg via INTRAVENOUS

## 2017-05-21 MED ORDER — ONDANSETRON HCL 4 MG/2ML IJ SOLN
4.0000 mg | Freq: Four times a day (QID) | INTRAMUSCULAR | Status: DC | PRN
  Administered 2017-05-21: 4 mg via INTRAVENOUS

## 2017-05-21 MED ORDER — OXYCODONE HCL 5 MG PO TABS: 5.0000 mg | ORAL_TABLET | Freq: Once | ORAL | Status: DC | PRN

## 2017-05-21 MED ORDER — ACETAMINOPHEN 325 MG PO TABS
650.0000 mg | ORAL_TABLET | Freq: Four times a day (QID) | ORAL | Status: DC | PRN
  Administered 2017-05-22 – 2017-05-23 (×2): 650 mg via ORAL
  Filled 2017-05-21 (×2): qty 2

## 2017-05-21 MED ORDER — INSULIN ASPART 100 UNIT/ML ~~LOC~~ SOLN
0.0000 [IU] | Freq: Three times a day (TID) | SUBCUTANEOUS | Status: DC
  Administered 2017-05-21 – 2017-05-23 (×3): 2 [IU] via SUBCUTANEOUS

## 2017-05-21 MED ORDER — PIPERACILLIN-TAZOBACTAM 3.375 G IVPB
3.3750 g | Freq: Three times a day (TID) | INTRAVENOUS | Status: DC
  Administered 2017-05-21 – 2017-05-23 (×5): 3.375 g via INTRAVENOUS
  Filled 2017-05-21 (×5): qty 50

## 2017-05-21 MED ORDER — SODIUM CHLORIDE 0.9 % IV BOLUS (SEPSIS)
250.0000 mL | Freq: Once | INTRAVENOUS | Status: AC
  Administered 2017-05-21: 250 mL via INTRAVENOUS

## 2017-05-21 MED ORDER — DULOXETINE HCL 60 MG PO CPEP
60.0000 mg | ORAL_CAPSULE | Freq: Every day | ORAL | Status: DC
  Administered 2017-05-21 – 2017-05-23 (×3): 60 mg via ORAL
  Filled 2017-05-21 (×3): qty 1

## 2017-05-21 MED ORDER — FLUOXETINE HCL 20 MG PO CAPS
40.0000 mg | ORAL_CAPSULE | Freq: Every day | ORAL | Status: DC
  Administered 2017-05-21 – 2017-05-23 (×3): 40 mg via ORAL
  Filled 2017-05-21 (×3): qty 2

## 2017-05-21 MED ORDER — LACTATED RINGERS IV SOLN
INTRAVENOUS | Status: DC | PRN
  Administered 2017-05-21: 13:00:00 via INTRAVENOUS

## 2017-05-21 MED ORDER — ALBUTEROL SULFATE HFA 108 (90 BASE) MCG/ACT IN AERS
INHALATION_SPRAY | RESPIRATORY_TRACT | Status: DC | PRN
  Administered 2017-05-21: 6 via RESPIRATORY_TRACT

## 2017-05-21 MED ORDER — SODIUM CHLORIDE 0.9 % IV BOLUS (SEPSIS)
1000.0000 mL | Freq: Once | INTRAVENOUS | Status: AC
  Administered 2017-05-21: 1000 mL via INTRAVENOUS

## 2017-05-21 MED ORDER — SODIUM CHLORIDE 0.9% FLUSH: 3.0000 mL | INTRAVENOUS | Status: DC | PRN

## 2017-05-21 MED ORDER — PIPERACILLIN-TAZOBACTAM 3.375 G IVPB 30 MIN
3.3750 g | Freq: Once | INTRAVENOUS | Status: AC
  Administered 2017-05-21: 3.375 g via INTRAVENOUS
  Filled 2017-05-21: qty 50

## 2017-05-21 MED ORDER — SUCCINYLCHOLINE CHLORIDE 200 MG/10ML IV SOSY
PREFILLED_SYRINGE | INTRAVENOUS | Status: DC | PRN
  Administered 2017-05-21: 120 mg via INTRAVENOUS

## 2017-05-21 MED ORDER — IOPAMIDOL (ISOVUE-300) INJECTION 61%
INTRAVENOUS | Status: DC | PRN
  Administered 2017-05-21: 2 mL

## 2017-05-21 MED ORDER — FENTANYL CITRATE (PF) 100 MCG/2ML IJ SOLN
INTRAMUSCULAR | Status: DC | PRN
  Administered 2017-05-21 (×2): 50 ug via INTRAVENOUS

## 2017-05-21 MED ORDER — PROPOFOL 10 MG/ML IV BOLUS
INTRAVENOUS | Status: AC
  Filled 2017-05-21: qty 40

## 2017-05-21 MED ORDER — ACETAMINOPHEN 500 MG PO TABS
1000.0000 mg | ORAL_TABLET | Freq: Once | ORAL | Status: AC
  Administered 2017-05-21: 1000 mg via ORAL
  Filled 2017-05-21: qty 2

## 2017-05-21 MED ORDER — FENTANYL CITRATE (PF) 100 MCG/2ML IJ SOLN
INTRAMUSCULAR | Status: AC
  Filled 2017-05-21: qty 2

## 2017-05-21 MED ORDER — SODIUM CHLORIDE 0.9% FLUSH
3.0000 mL | Freq: Two times a day (BID) | INTRAVENOUS | Status: DC
  Administered 2017-05-21 – 2017-05-22 (×3): 3 mL via INTRAVENOUS

## 2017-05-21 MED ORDER — PRAVASTATIN SODIUM 40 MG PO TABS
40.0000 mg | ORAL_TABLET | Freq: Every day | ORAL | Status: DC
  Administered 2017-05-21 – 2017-05-23 (×3): 40 mg via ORAL
  Filled 2017-05-21 (×3): qty 1

## 2017-05-21 MED ORDER — MIDAZOLAM HCL 2 MG/2ML IJ SOLN
INTRAMUSCULAR | Status: AC
  Filled 2017-05-21: qty 2

## 2017-05-21 MED ORDER — HYDROMORPHONE HCL 1 MG/ML IJ SOLN: 0.2500 mg | INTRAMUSCULAR | Status: DC | PRN

## 2017-05-21 MED ORDER — ONDANSETRON HCL 4 MG PO TABS: 4.0000 mg | ORAL_TABLET | Freq: Four times a day (QID) | ORAL | Status: DC | PRN

## 2017-05-21 MED ORDER — PROMETHAZINE HCL 25 MG/ML IJ SOLN
INTRAMUSCULAR | Status: AC
  Filled 2017-05-21: qty 1

## 2017-05-21 MED ORDER — SODIUM CHLORIDE 0.9 % IV SOLN
INTRAVENOUS | Status: DC
  Administered 2017-05-22 – 2017-05-23 (×4): via INTRAVENOUS

## 2017-05-21 MED ORDER — MORPHINE SULFATE (PF) 4 MG/ML IV SOLN
4.0000 mg | Freq: Once | INTRAVENOUS | Status: AC
  Administered 2017-05-21: 4 mg via INTRAVENOUS
  Filled 2017-05-21: qty 1

## 2017-05-21 MED ORDER — SODIUM CHLORIDE 0.9 % IV SOLN: 250.0000 mL | INTRAVENOUS | Status: DC | PRN

## 2017-05-21 MED ORDER — TOPIRAMATE 25 MG PO TABS
25.0000 mg | ORAL_TABLET | Freq: Two times a day (BID) | ORAL | Status: DC
  Administered 2017-05-21 – 2017-05-23 (×4): 25 mg via ORAL
  Filled 2017-05-21 (×4): qty 1

## 2017-05-21 MED ORDER — LIDOCAINE 2% (20 MG/ML) 5 ML SYRINGE
INTRAMUSCULAR | Status: DC | PRN
  Administered 2017-05-21: 80 mg via INTRAVENOUS

## 2017-05-21 MED ORDER — PROPOFOL 10 MG/ML IV BOLUS
INTRAVENOUS | Status: DC | PRN
  Administered 2017-05-21: 150 mg via INTRAVENOUS

## 2017-05-21 MED ORDER — EPHEDRINE 5 MG/ML INJ
INTRAVENOUS | Status: AC
  Filled 2017-05-21: qty 10

## 2017-05-21 MED ORDER — PROMETHAZINE HCL 25 MG/ML IJ SOLN: 6.2500 mg | INTRAMUSCULAR | Status: DC | PRN

## 2017-05-21 MED ORDER — ACETAMINOPHEN 650 MG RE SUPP: 650.0000 mg | Freq: Four times a day (QID) | RECTAL | Status: DC | PRN

## 2017-05-21 MED ORDER — DEXAMETHASONE SODIUM PHOSPHATE 10 MG/ML IJ SOLN
INTRAMUSCULAR | Status: DC | PRN
  Administered 2017-05-21: 10 mg via INTRAVENOUS

## 2017-05-21 MED ORDER — OXYCODONE HCL 5 MG/5ML PO SOLN: 5.0000 mg | Freq: Once | ORAL | Status: DC | PRN

## 2017-05-21 SURGICAL SUPPLY — 13 items
BAG URO CATCHER STRL LF (MISCELLANEOUS) ×2 IMPLANT
CATH INTERMIT  6FR 70CM (CATHETERS) IMPLANT
CLOTH BEACON ORANGE TIMEOUT ST (SAFETY) ×2 IMPLANT
COVER FOOTSWITCH UNIV (MISCELLANEOUS) IMPLANT
COVER SURGICAL LIGHT HANDLE (MISCELLANEOUS) ×2 IMPLANT
GLOVE BIO SURGEON STRL SZ7.5 (GLOVE) ×2 IMPLANT
GOWN STRL REUS W/TWL LRG LVL3 (GOWN DISPOSABLE) ×4 IMPLANT
GUIDEWIRE ANG ZIPWIRE 038X150 (WIRE) IMPLANT
GUIDEWIRE STR DUAL SENSOR (WIRE) IMPLANT
MANIFOLD NEPTUNE II (INSTRUMENTS) ×2 IMPLANT
PACK CYSTO (CUSTOM PROCEDURE TRAY) ×2 IMPLANT
STENT URET 6FRX24 CONTOUR (STENTS) ×1 IMPLANT
TUBING CONNECTING 10 (TUBING) ×2 IMPLANT

## 2017-05-21 NOTE — ED Notes (Signed)
Bed: AR01 Expected date: 05/21/17 Expected time: 1:23 AM Means of arrival:  Comments:

## 2017-05-21 NOTE — Anesthesia Procedure Notes (Signed)
Procedure Name: Intubation Date/Time: 05/21/2017 2:09 PM Performed by: Lavina Hamman, CRNA Pre-anesthesia Checklist: Patient identified, Emergency Drugs available, Suction available, Patient being monitored and Timeout performed Patient Re-evaluated:Patient Re-evaluated prior to induction Oxygen Delivery Method: Circle system utilized Preoxygenation: Pre-oxygenation with 100% oxygen Induction Type: IV induction Ventilation: Mask ventilation without difficulty Laryngoscope Size: Mac and 4 Grade View: Grade I Tube type: Oral Tube size: 7.5 mm Number of attempts: 1 Airway Equipment and Method: Stylet Placement Confirmation: ETT inserted through vocal cords under direct vision,  positive ETCO2,  CO2 detector and breath sounds checked- equal and bilateral Secured at: 21 cm Tube secured with: Tape Dental Injury: Teeth and Oropharynx as per pre-operative assessment

## 2017-05-21 NOTE — Progress Notes (Signed)
Pharmacy Antibiotic Note  [name] is a 52 y.o. female admitted on 05/21/2017 with sepsis due to UTI.  Pharmacy has been consulted for zosyn dosing. Patient with obstructing L ureteral stone s/p stenting  Plan: Zosyn 3.375g IV q8h (4 hour infusion).  - pharmacy to sign-off as do not anticipate need for dose adjustment  Height: 5\' 2"  (157.5 cm) Weight: 147 lb (66.7 kg) IBW/kg (Calculated) : 50.1  Temp (24hrs), Avg:102 F (38.9 C), Min:102 F (38.9 C), Max:102 F (38.9 C)  Recent Labs  Lab 05/21/17 1106 05/21/17 1115  WBC 10.6*  --   CREATININE 0.92 0.90  LATICACIDVEN  --  1.80    Estimated Creatinine Clearance: 65.5 mL/min (by C-G formula based on SCr of 0.9 mg/dL).    No Known Allergies  Antimicrobials this admission: 2/22 zosyn >>  Dose adjustments this admission:  Microbiology results: 2/22 BCx:  2/22 UCx:    Thank you for allowing pharmacy to be a part of this patient's care.  Doreene Eland, PharmD, BCPS.   Pager: [phone] 05/21/2017 2:02 PM

## 2017-05-21 NOTE — ED Notes (Signed)
Per PCP patient has a left obstructing kidney stone

## 2017-05-21 NOTE — Anesthesia Preprocedure Evaluation (Signed)
Anesthesia Evaluation  Patient identified by MRN, date of birth, ID band Patient awake    Reviewed: Allergy & Precautions, H&P , NPO status , Patient's Chart, lab work & pertinent test results  History of Anesthesia Complications Negative for: history of anesthetic complications  Airway Mallampati: II  TM Distance: >3 FB Neck ROM: Full    Dental  (+) Dental Advisory Given   Pulmonary sleep apnea and Continuous Positive Airway Pressure Ventilation ,    Pulmonary exam normal breath sounds clear to auscultation       Cardiovascular hypertension, Pt. on medications (-) angina Rhythm:Regular Rate:Normal     Neuro/Psych  Headaches, Depression    GI/Hepatic negative GI ROS, Neg liver ROS,   Endo/Other  diabetes, Oral Hypoglycemic AgentsHyperthyroidism   Renal/GU negative Renal ROS     Musculoskeletal   Abdominal   Peds  Hematology  (+) Blood dyscrasia (Hb 11.0), anemia ,   Anesthesia Other Findings   Reproductive/Obstetrics                             Anesthesia Physical  Anesthesia Plan  ASA: III and emergent  Anesthesia Plan: General   Post-op Pain Management:    Induction: Intravenous, Rapid sequence and Cricoid pressure planned  PONV Risk Score and Plan: 3 and Ondansetron, Dexamethasone and Midazolam  Airway Management Planned: Oral ETT  Additional Equipment:   Intra-op Plan:   Post-operative Plan: Extubation in OR  Informed Consent: I have reviewed the patients History and Physical, chart, labs and discussed the procedure including the risks, benefits and alternatives for the proposed anesthesia with the patient or authorized representative who has indicated his/her understanding and acceptance.   Dental advisory given  Plan Discussed with: CRNA and Surgeon  Anesthesia Plan Comments:         Anesthesia Quick Evaluation

## 2017-05-21 NOTE — Progress Notes (Signed)
A consult was received from an ED physician for zoayn per pharmacy dosing.  The patient's profile has been reviewed for ht/wt/allergies/indication/available labs.   A one time order has been placed by EDP for zosyn 3.375gm IV x 1. No change in dose required.  Further antibiotics/pharmacy consults should be ordered by admitting physician if indicated.                       Thank you, Doreene Eland, PharmD, BCPS.   05/21/2017 10:59 AM

## 2017-05-21 NOTE — Consult Note (Signed)
05/21/2017 1:11 PM   [name] 1965-11-19 [id number]  Referring provider: Dr. Shirlyn Goltz  CC: Left ureteral stone, sepsis  HPI: The patient is a 52 year old female with a past medical history for nephrolithiasis x1 who presented to the hospital with a known left ureteral stone and sepsis.  She was recently diagnosed per CT scan with a 3 mm mid left ureteral stone.  She had no other stones on that imaging study.  She returned to the ER today feeling weak and febrile.  In the ER she had a temperature of 102 degrees.  Her heart rate was as high as 140 degrees.  Her urinalysis was grossly positive for infection.  She overall feels ill at this time.  Her heart rate remains in the 110s-120s.  Her white blood count is however normal.  Urology was consulted for sepsis in the setting of a left ureteral obstructing stone.   PMH: Past Medical History:  Diagnosis Date  . Chicken pox    CHILDHOOD  . Depression   . Diabetes mellitus without complication ([place])   . Headache(784.0)    migraine  . Hyperlipidemia   . Hypertension   . Hyperthyroidism   . Multinodular goiter 12/28/2012  . Sleep apnea    uses a cpap    Surgical History: Past Surgical History:  Procedure Laterality Date  . ABDOMINAL HYSTERECTOMY    . CHOLECYSTECTOMY    . ROBOT ASSISTED MYOMECTOMY Left 06/02/2012   Procedure: ROBOTIC ASSISTED MYOMECTOMY;  Surgeon: Marvene Staff, MD;  Location: [facility];  Service: Gynecology;  Laterality: Left;  anexal mass is a myoma left side  . ROBOTIC ASSISTED LAPAROSCOPIC LYSIS OF ADHESION N/A 06/02/2012   Procedure: ROBOTIC ASSISTED LAPAROSCOPIC LYSIS OF ADHESION;  Surgeon: Marvene Staff, MD;  Location: [facility];  Service: Gynecology;  Laterality: N/A;  . ROBOTIC ASSISTED SALPINGO OOPHERECTOMY Left 06/02/2012   Procedure: ROBOTIC ASSISTED SALPINGO OOPHORECTOMY;  Surgeon: Marvene Staff, MD;  Location: [facility];  Service: Gynecology;  Laterality: Left;  pelvic washings  . ULNAR NERVE  TRANSPOSITION Right 08/15/2013   Procedure: RIGHT ULNAR NERVE IN SITU DECOMPRESSION/POSSIBLE TRANSPOSITION;  Surgeon: Cammie Sickle., MD;  Location: [facility];  Service: Orthopedics;  Laterality: Right;     Allergies: No Known Allergies  Family History: Family History  Problem Relation Age of Onset  . Arthritis Mother   . Arthritis Father   . Hyperlipidemia Father   . Diabetes Father   . Hypertension Sister   . Mental illness Sister   . Diabetes Sister   . Diabetes Brother   . Cancer Paternal Aunt        BREAST    Social History:  reports that  has never smoked. she has never used smokeless tobacco. She reports that she does not drink alcohol or use drugs.  ROS: 12 point ROS negative except for above  Physical Exam: BP 131/84   Pulse (!) 114   Temp (!) 102 F (38.9 C) (Oral)   Resp (!) 21   Ht 5\' 2"  (1.575 m)   Wt 147 lb (66.7 kg)   SpO2 98%   BMI 26.89 kg/m   Constitutional:  Alert and oriented, No acute distress. HEENT: [place] AT, moist mucus membranes.  Trachea midline, no masses. Cardiovascular: No clubbing, cyanosis, or edema. Respiratory: Normal respiratory effort, no increased work of breathing. GI: Abdomen is soft, nontender, nondistended, no abdominal masses GU: No CVA tenderness.  Skin: No rashes, bruises or suspicious lesions. Lymph:  No cervical or inguinal adenopathy. Neurologic: Grossly intact, no focal deficits, moving all 4 extremities. Psychiatric: Normal mood and affect.  Laboratory Data: Lab Results  Component Value Date   WBC 10.6 (H) 05/21/2017   HGB 14.3 05/21/2017   HCT 42.0 05/21/2017   MCV 72.0 (L) 05/21/2017   PLT 383 05/21/2017    Lab Results  Component Value Date   CREATININE 0.90 05/21/2017    No results found for: PSA  No results found for: TESTOSTERONE  Lab Results  Component Value Date   HGBA1C 8.2 (H) 09/25/2016    Urinalysis    Component Value Date/Time   COLORURINE YELLOW 05/21/2017 1106    APPEARANCEUR HAZY (A) 05/21/2017 1106   LABSPEC 1.022 05/21/2017 1106   PHURINE 5.0 05/21/2017 1106   GLUCOSEU >=500 (A) 05/21/2017 1106   GLUCOSEU NEGATIVE 02/11/2015 0805   HGBUR LARGE (A) 05/21/2017 1106   HGBUR trace-intact 07/06/2007 1428   BILIRUBINUR NEGATIVE 05/21/2017 1106   KETONESUR 5 (A) 05/21/2017 1106   PROTEINUR 30 (A) 05/21/2017 1106   UROBILINOGEN 0.2 02/11/2015 0805   NITRITE NEGATIVE 05/21/2017 1106   LEUKOCYTESUR MODERATE (A) 05/21/2017 1106    Pertinent Imaging: CT reviewed as above  Assessment & Plan:    1.  Left mid ureteral stone 2.  Sepsis 3.  UTI I discussed the patient that she has an obstructing left ureteral stone a sign of sepsis.  I did discuss with her and her family the clinical significance of this and my concern that her infection will worsen and less first kidney is on obstructed.  We discussed the need for left ureteral stent placement emergently.  She is agreeable to undergoing a cystoscopy, left ureteral stent placement with left retrograde pyelogram.  All risks, benefits, indications of the procedure were described in great detail.  She understands the goal today is to unobstructed kidney and not to remove her stone.  All questions were answered.  All risks were discussed.  The patient is agreeable to proceeding.     Nickie Retort, MD

## 2017-05-21 NOTE — Op Note (Signed)
Date of procedure: 05/21/17  Preoperative diagnosis:  1. Left ureteral stone 2. Sepsis 3. UTI   Postoperative diagnosis:  1. Same  Procedure: 1. Cystoscopy 2. Left retrograde pyelogram with interpretation 3. Left ureteral stent placement 6 French by 24 cm  Surgeon: Baruch Gouty, MD  Anesthesia: General  Complications: None  Intraoperative findings: Upon placement of the open ureteral catheter in the left renal pelvis there was a hydronephrotic drip.  After the hydronephrotic drip ceased, low pressure left retrograde pyelogram was used to identify the collecting system.  A left ureteral stent was successfully placed.    EBL: None  Specimens: Urine culture from left ureter  Drains: 6 French by 24 cm left double-J ureteral stent  Disposition: Stable to the postanesthesia care unit  Indication for procedure: The patient is a 52 y.o. female with sepsis and an obstructing 3 mm left mid ureteral calculus presents today for emergent ureteral stent placement.  After reviewing the management options for treatment, the patient elected to proceed with the above surgical procedure(s). We have discussed the potential benefits and risks of the procedure, side effects of the proposed treatment, the likelihood of the patient achieving the goals of the procedure, and any potential problems that might occur during the procedure or recuperation. Informed consent has been obtained.  Description of procedure: The patient was met in the preoperative area. All risks, benefits, and indications of the procedure were described in great detail. The patient consented to the procedure. Preoperative antibiotics were given. The patient was taken to the operative theater. General anesthesia was induced per the anesthesia service. The patient was then placed in the dorsal lithotomy position and prepped and draped in the usual sterile fashion. A preoperative timeout was called.   A 21 French 30 degree cystoscope  was inserted to the patient's bladder per urethra atraumatically.  The left ureteral orifice was visualized and a sensor wire was advanced through it up to the level of the left renal pelvis under fluoroscopy.  An open-ended catheter was then advanced over the sensor wire into the left renal pelvis and the sensor wire was removed.  Hydronephrotic drip was then noted.  The left upper tract was allowed to drain itself into the hydronephrotic drip stopped.  A low pressure left retrograde pyelogram was obtained solely to identify the collecting system.  A sensor wire was then reintroduced up to the level of the left renal pelvis and the open-ended catheter removed.  A 6 French by 24 cm double-J ureteral stent was then placed.  The sensor wire was removed.  A curl was seen in the patient's left renal pelvis under fluoroscopy and the urinary bladder and atrophic sensation.  There is a large amount of debris and infected appearing urine that drained.  The patient's bladder was then drained.  She was awoke from anesthesia and transferred in stable condition to the postanesthesia care unit.  Plan: The patient will be admitted to the floor under the hospitalist service for IV antibiotics.  She will need definitive stone management in a few weeks once her infection has cleared.  Baruch Gouty, M.D.

## 2017-05-21 NOTE — ED Triage Notes (Addendum)
Pt verbalizes confirmed left kidney stone for a week; obstructing.

## 2017-05-21 NOTE — ED Provider Notes (Signed)
[facility] Provider Note   CSN: [id number] Arrival date & time: 05/21/17  1022     History   Chief Complaint Chief Complaint  Patient presents with  . Flank Pain    HPI [name] is a 52 y.o. female history of diabetes, hypertension, hyperlipidemia, who presenting with left flank pain, fever.  Patient states that she has intermittent left flank pain for the last week or so.  It is progressively getting worse and becomes more sharp.  She states that it is associated with some nausea as well.  She is taking Tylenol and Motrin with minimal relief.  Went to see her doctor yesterday and a CT outpatient that showed severe hydro-from a obstructing 3 mm left ureteral stone with pyelonephritis.  She states that she had urinalysis done in the office and was not started on antibiotics.  She had severe pain today and developed fever this morning.  She states that she just felt warm but did not take her temperature this morning.  Denies any cough or flu exposure.  The history is provided by the patient.    Past Medical History:  Diagnosis Date  . Chicken pox    CHILDHOOD  . Depression   . Diabetes mellitus without complication ([place])   . Headache(784.0)    migraine  . Hyperlipidemia   . Hypertension   . Hyperthyroidism   . Multinodular goiter 12/28/2012  . Sleep apnea    uses a cpap    Patient Active Problem List   Diagnosis Date Noted  . Urinary tract obstruction due to kidney stone 05/21/2017  . Kidney stone on left side   . Pyelonephritis   . Depression 05/02/2013  . HTN (hypertension) 05/02/2013  . Type 2 diabetes mellitus ([place]) 12/28/2012  . HYPERCHOLESTEROLEMIA 05/27/2006  . Obesity 05/27/2006  . Migraine 05/27/2006  . Sleep apnea 05/27/2006    Past Surgical History:  Procedure Laterality Date  . ABDOMINAL HYSTERECTOMY    . CHOLECYSTECTOMY    . CYSTOSCOPY W/ URETERAL STENT PLACEMENT Left 05/21/2017   Procedure: CYSTOSCOPY  WITH RETROGRADE PYELOGRAM/URETERAL STENT PLACEMENT;  Surgeon: Nickie Retort, MD;  Location: [facility];  Service: Urology;  Laterality: Left;  . ROBOT ASSISTED MYOMECTOMY Left 06/02/2012   Procedure: ROBOTIC ASSISTED MYOMECTOMY;  Surgeon: Marvene Staff, MD;  Location: [facility];  Service: Gynecology;  Laterality: Left;  anexal mass is a myoma left side  . ROBOTIC ASSISTED LAPAROSCOPIC LYSIS OF ADHESION N/A 06/02/2012   Procedure: ROBOTIC ASSISTED LAPAROSCOPIC LYSIS OF ADHESION;  Surgeon: Marvene Staff, MD;  Location: [facility];  Service: Gynecology;  Laterality: N/A;  . ROBOTIC ASSISTED SALPINGO OOPHERECTOMY Left 06/02/2012   Procedure: ROBOTIC ASSISTED SALPINGO OOPHORECTOMY;  Surgeon: Marvene Staff, MD;  Location: [facility];  Service: Gynecology;  Laterality: Left;  pelvic washings  . ULNAR NERVE TRANSPOSITION Right 08/15/2013   Procedure: RIGHT ULNAR NERVE IN SITU DECOMPRESSION/POSSIBLE TRANSPOSITION;  Surgeon: Cammie Sickle., MD;  Location: [facility];  Service: Orthopedics;  Laterality: Right;    OB History    No data available       Home Medications    Prior to Admission medications   Medication Sig Start Date End Date Taking? Authorizing Provider  aspirin 81 MG tablet Take 81 mg by mouth daily.   Yes [provider]  DULoxetine (CYMBALTA) 60 MG capsule Take 60 mg by mouth daily. 05/07/17  Yes [provider]  FLUoxetine (PROZAC) 40 MG capsule Take  40 mg by mouth daily. 04/20/17  Yes [provider]  glimepiride (AMARYL) 1 MG tablet Take 1 tablet (1 mg total) by mouth daily before supper. 06/25/16  Yes Elayne Snare, MD  INVOKANA 100 MG TABS tablet TAKE 1 TABLET BY MOUTH BEFORE BREAKFAST 04/20/17  Yes Elayne Snare, MD  lisinopril (PRINIVIL,ZESTRIL) 10 MG tablet TAKE 1 TABLET (10 MG TOTAL) BY MOUTH DAILY. 01/31/16  Yes Lucretia Kern, DO  meloxicam (MOBIC) 7.5 MG tablet Take 7.5 mg by mouth daily. 04/20/17  Yes [provider]    metFORMIN (GLUCOPHAGE-XR) 500 MG 24 hr tablet TAKE 4 TABLETS BY MOUTH DAILY WITH SUPPER. 09/28/16  Yes Elayne Snare, MD  naproxen (NAPROSYN) 500 MG tablet Take 500 mg by mouth every 12 (twelve) hours. 02/24/16  Yes [provider]  pravastatin (PRAVACHOL) 40 MG tablet Take 1 tablet (40 mg total) by mouth daily. 09/28/16  Yes Elayne Snare, MD  topiramate (TOPAMAX) 25 MG tablet Take 25 mg by mouth 2 (two) times daily. 05/07/17  Yes [provider]  VICTOZA 18 MG/3ML SOPN INJECT 1.8 MG AS DIRECTED DAILY 01/29/17  Yes Elayne Snare, MD  Vitamin D, Ergocalciferol, (DRISDOL) 50000 units CAPS capsule Take 50,000 Units by mouth once a week. 02/23/17  Yes [provider]  glucose blood (ONETOUCH VERIO) test strip Use as instructed 06/25/16   Elayne Snare, MD  Insulin Pen Needle (B-D UF III MINI PEN NEEDLES) 31G X 5 MM MISC USE 1 daily TO INJECT VICTOZA 09/28/16   Elayne Snare, MD    Family History Family History  Problem Relation Age of Onset  . Arthritis Mother   . Arthritis Father   . Hyperlipidemia Father   . Diabetes Father   . Hypertension Sister   . Mental illness Sister   . Diabetes Sister   . Diabetes Brother   . Cancer Paternal Aunt        BREAST    Social History Social History   Tobacco Use  . Smoking status: Never Smoker  . Smokeless tobacco: Never Used  Substance Use Topics  . Alcohol use: No  . Drug use: No     Allergies   Patient has no known allergies.   Review of Systems Review of Systems  Constitutional: Positive for fever.  Genitourinary: Positive for flank pain.  All other systems reviewed and are negative.    Physical Exam Updated Vital Signs BP 114/77   Pulse 98   Temp 98.8 F (37.1 C) (Oral)   Resp 18   Ht 5\' 2"  (1.575 m)   Wt 68.1 kg (150 lb 1.6 oz)   SpO2 93%   BMI 27.45 kg/m   Physical Exam  Constitutional: She is oriented to person, place, and time.  Uncomfortable, ill appearing   HENT:  Head: Normocephalic.  MM dry    Eyes: Conjunctivae and EOM are normal. Pupils are equal, round, and reactive to light.  Neck: Normal range of motion. Neck supple. No tracheal deviation present. No thyromegaly present.  Cardiovascular:  Tachycardic   Pulmonary/Chest: Effort normal and breath sounds normal. No stridor. No respiratory distress.  Abdominal: Soft. Bowel sounds are normal.  + L CVAT   Musculoskeletal: Normal range of motion.  Neurological: She is alert and oriented to person, place, and time. No cranial nerve deficit. Coordination normal.  Skin: Skin is warm.  Psychiatric: She has a normal mood and affect.  Nursing note and vitals reviewed.    ED Treatments / Results  Labs (all  labs ordered are listed, but only abnormal results are displayed) Labs Reviewed  URINALYSIS, ROUTINE W REFLEX MICROSCOPIC - Abnormal; Notable for the following components:      Result Value   APPearance HAZY (*)    Glucose, UA >=500 (*)    Hgb urine dipstick LARGE (*)    Ketones, ur 5 (*)    Protein, ur 30 (*)    Leukocytes, UA MODERATE (*)    Bacteria, UA FEW (*)    Squamous Epithelial / LPF 0-5 (*)    All other components within normal limits  COMPREHENSIVE METABOLIC PANEL - Abnormal; Notable for the following components:   Sodium 134 (*)    Potassium 3.3 (*)    Chloride 94 (*)    Glucose, Bld 169 (*)    Anion gap 17 (*)    All other components within normal limits  CBC WITH DIFFERENTIAL/PLATELET - Abnormal; Notable for the following components:   WBC 10.6 (*)    RBC 5.21 (*)    MCV 72.0 (*)    MCH 23.2 (*)    Neutro Abs 8.1 (*)    Monocytes Absolute 1.4 (*)    All other components within normal limits  GLUCOSE, CAPILLARY - Abnormal; Notable for the following components:   Glucose-Capillary 127 (*)    All other components within normal limits  GLUCOSE, CAPILLARY - Abnormal; Notable for the following components:   Glucose-Capillary 164 (*)    All other components within normal limits  I-STAT CHEM 8, ED -  Abnormal; Notable for the following components:   Sodium 134 (*)    Potassium 3.3 (*)    Chloride 96 (*)    Glucose, Bld 164 (*)    Calcium, Ion 1.09 (*)    All other components within normal limits  CULTURE, BLOOD (ROUTINE X 2)  CULTURE, BLOOD (ROUTINE X 2)  URINE CULTURE  URINE CULTURE  PREGNANCY, URINE  BASIC METABOLIC PANEL  CBC  I-STAT CG4 LACTIC ACID, ED    EKG  EKG Interpretation  Date/Time:  Friday May 21 2017 11:23:59 EST Ventricular Rate:  119 PR Interval:    QRS Duration: 67 QT Interval:  382 QTC Calculation: 538 R Axis:   53 Text Interpretation:  Sinus tachycardia Nonspecific T abnormalities, diffuse leads Prolonged QT interval Baseline wander in lead(s) V6 Since last tracing rate faster Confirmed by Wandra Arthurs [phone]) on 05/21/2017 12:24:56 PM       Radiology Ct Abdomen Pelvis Wo Contrast  Result Date: 05/20/2017 CLINICAL DATA:  Left flank pain for several days with nausea and vomiting EXAM: CT ABDOMEN AND PELVIS WITHOUT CONTRAST TECHNIQUE: Multidetector CT imaging of the abdomen and pelvis was performed following the standard protocol without oral or IV contrast. COMPARISON:  May 26, 2012 FINDINGS: Lower chest: Lung bases are clear. Hepatobiliary: No focal liver lesions are evident on this noncontrast enhanced study. Gallbladder is absent. There is no biliary duct dilatation. Pancreas: No pancreatic mass or inflammatory focus evident. Spleen: No splenic lesions are evident. Adrenals/Urinary Tract: Adrenals bilaterally appear normal. There is no renal mass on either side. There is no appreciable hydronephrosis on the right. There is severe hydronephrosis on the left. There is no intrarenal calculus on either side. There is ureterectasis on the left to the level of the mid sacrum. At this site, there is a 3 x 2 mm calculus, best seen on axial slice 64 series 2, coronal slice 61 series 3, and sagittal slice 76 series 4. No other ureteral calculi  are evident.  The urinary bladder is midline. There is air in the anterior aspect of the urinary bladder. Stomach/Bowel: Stomach is distended with fluid and to a lesser extent air. There is no appreciable bowel wall or mesenteric thickening. There is no bowel obstruction. No free air or portal venous air. Vascular/Lymphatic: There is no abdominal aortic aneurysm. No vascular lesions are evident. No adenopathy is appreciable in the abdomen or pelvis. Reproductive: Uterus is absent. The previously noted left adnexal mass has been removed. There is currently no pelvic mass. Other: Appendix appears normal. No abscess or ascites is evident in the abdomen or pelvis. Musculoskeletal: No blastic or lytic bone lesions are evident. There is no intramuscular or abdominal wall lesion. IMPRESSION: 1. There is severe hydronephrosis and proximal ureterectasis on the left. There is a 3 x 2 mm calculus in the left ureter at the mid sacral level. No other ureteral calculus evident. No intrarenal calculi on either side. It should be noted that given the degree of hydronephrosis on the left, a degree of superimposed pyelonephritis on the left cannot be excluded. No renal abscess or perinephric stranding seen. 2. There is air in the anterior urinary bladder. If patient has not been instrumented recently, this air must raise concern for gas-forming organism within the urine. Correlation with urinalysis advised. 3. No evident bowel obstruction. No abscess. Appendix appears normal. Note that stomach is moderately distended with fluid and air. 4.  Uterus and left ovary absent.  No evident pelvic mass. 5.  Gallbladder absent. These results will be called to the ordering clinician or representative by the Radiologist Assistant, and communication documented in the PACS or zVision Dashboard. Electronically Signed   By: Lowella Grip III M.D.   On: 05/20/2017 14:52   Dg Chest Port 1 View  Result Date: 05/21/2017 CLINICAL DATA:  Fever EXAM: PORTABLE  CHEST 1 VIEW COMPARISON:  None. FINDINGS: Lungs are clear. Heart size and pulmonary vascularity are normal. No adenopathy. No bone lesions. IMPRESSION: No edema or consolidation. Electronically Signed   By: Lowella Grip III M.D.   On: 05/21/2017 12:29   Dg Abd Portable 1 View  Result Date: 05/21/2017 CLINICAL DATA:  Left flank pain.  Left abdominal pain. EXAM: PORTABLE ABDOMEN - 1 VIEW COMPARISON:  CT 05/20/2017. FINDINGS: Surgical clips right upper quadrant. Soft tissue structures are unremarkable. Minimally prominent air-filled loops of small and large bowel are noted. Mild adynamic ileus cannot be excluded. Pelvic calcifications consistent phleboliths. No acute bony abnormality. IMPRESSION: Minimally prominent air-filled loops of small and large bowel noted. Mild adynamic ileus cannot be excluded. Electronically Signed   By: Marcello Moores  Register   On: 05/21/2017 12:30   Dg C-arm 1-60 Min-no Report  Result Date: 05/21/2017 Fluoroscopy was utilized by the requesting physician.  No radiographic interpretation.    Procedures Procedures (including critical care time)  CRITICAL CARE Performed by: Wandra Arthurs   Total critical care time: 30 minutes  Critical care time was exclusive of separately billable procedures and treating other patients.  Critical care was necessary to treat or prevent imminent or life-threatening deterioration.  Critical care was time spent personally by me on the following activities: development of treatment plan with patient and/or surrogate as well as nursing, discussions with consultants, evaluation of patient's response to treatment, examination of patient, obtaining history from patient or surrogate, ordering and performing treatments and interventions, ordering and review of laboratory studies, ordering and review of radiographic studies, pulse oximetry and re-evaluation of patient's condition.  Medications Ordered in ED Medications  pravastatin (PRAVACHOL)  tablet 40 mg (40 mg Oral Given 05/21/17 1718)  topiramate (TOPAMAX) tablet 25 mg (not administered)  FLUoxetine (PROZAC) capsule 40 mg (40 mg Oral Given 05/21/17 1718)  DULoxetine (CYMBALTA) DR capsule 60 mg (60 mg Oral Given 05/21/17 1720)  sodium chloride flush (NS) 0.9 % injection 3 mL (not administered)  sodium chloride flush (NS) 0.9 % injection 3 mL (not administered)  0.9 %  sodium chloride infusion (not administered)  acetaminophen (TYLENOL) tablet 650 mg (not administered)    Or  acetaminophen (TYLENOL) suppository 650 mg (not administered)  ondansetron (ZOFRAN) tablet 4 mg ( Oral See Alternative 05/21/17 1345)    Or  ondansetron (ZOFRAN) injection 4 mg (4 mg Intravenous Given 05/21/17 1345)  insulin aspart (novoLOG) injection 0-9 Units (2 Units Subcutaneous Given 05/21/17 1719)  piperacillin-tazobactam (ZOSYN) IVPB 3.375 g (3.375 g Intravenous New Bag/Given 05/21/17 1719)  0.9 %  sodium chloride infusion (not administered)  sodium chloride 0.9 % bolus 1,000 mL (0 mLs Intravenous Stopped 05/21/17 1145)    And  sodium chloride 0.9 % bolus 1,000 mL (0 mLs Intravenous Stopped 05/21/17 1248)    And  sodium chloride 0.9 % bolus 250 mL (250 mLs Intravenous New Bag/Given 05/21/17 1249)  piperacillin-tazobactam (ZOSYN) IVPB 3.375 g (0 g Intravenous Stopped 05/21/17 1151)  acetaminophen (TYLENOL) tablet 1,000 mg (1,000 mg Oral Given 05/21/17 1121)  morphine 4 MG/ML injection 4 mg (4 mg Intravenous Given 05/21/17 1121)     Initial Impression / Assessment and Plan / ED Course  I have reviewed the triage vital signs and the nursing notes.  Pertinent labs & imaging results that were available during my care of the patient were reviewed by me and considered in my medical decision making (see chart for details).     [name] is a 53 y.o. female here with fever, flank pain. Had obstructed stone with hydro and pyelo on CT yesterday. Now has fever and tachycardia. Code sepsis initiated. Concerned for  sepsis from infected stone. Will get labs, lactate, UA, CXR, ab xray to confirm location. Will give zosyn empirically.   12 pm WBC 11. Cr 0.9. UA + UTI and blood. Given zosyn. Lactate nl. I called Dr. Pilar Jarvis from urology. He will come in and place ureteral stent. He request hospitalist admission. HR down to 115 now, pain improved. Not hypotensive in the ED. Will admit for sepsis, infected kidney stone.   1 pm Patient admitted by hospitalist. Patient to be taken to the OR for ureteral stent placement.   Final Clinical Impressions(s) / ED Diagnoses   Final diagnoses:  Pyelonephritis  Kidney stone on left side    ED Discharge Orders    None       Drenda Freeze, MD 05/21/17 1950

## 2017-05-21 NOTE — H&P (Signed)
History and Physical    [name] VOH:[id number] DOB: 09/17/1965 DOA: 05/21/2017  PCP: Lois Huxley, PA  Patient coming from: Home  Chief Complaint: Left flank pain and fever  HPI: [name] is a 52 y.o. female with medical history significant of diabetes, hypertension comes in with over a week of left flank pain that is progressively gotten worse.  She went to see her primary care physician yesterday imaging showed a stone in her left ureter with severe hydronephrosis.  Patient was sent home no antibiotics were started.  This morning she woke up with high fever and feeling worse so she came to the emergency department.  Patient's been having hematuria and dysuria for the last several days also.  She denies any nausea vomiting diarrhea.  Urology has been called was taken to the OR today for stent placement.  Patient is being referred for an obstructive infected urinary stone on the left side and pyelonephritis.  Review of Systems: As per HPI otherwise 10 point review of systems negative.   Past Medical History:  Diagnosis Date  . Chicken pox    CHILDHOOD  . Depression   . Diabetes mellitus without complication ([place])   . Headache(784.0)    migraine  . Hyperlipidemia   . Hypertension   . Hyperthyroidism   . Multinodular goiter 12/28/2012  . Sleep apnea    uses a cpap    Past Surgical History:  Procedure Laterality Date  . ABDOMINAL HYSTERECTOMY    . CHOLECYSTECTOMY    . ROBOT ASSISTED MYOMECTOMY Left 06/02/2012   Procedure: ROBOTIC ASSISTED MYOMECTOMY;  Surgeon: Marvene Staff, MD;  Location: [facility];  Service: Gynecology;  Laterality: Left;  anexal mass is a myoma left side  . ROBOTIC ASSISTED LAPAROSCOPIC LYSIS OF ADHESION N/A 06/02/2012   Procedure: ROBOTIC ASSISTED LAPAROSCOPIC LYSIS OF ADHESION;  Surgeon: Marvene Staff, MD;  Location: [facility];  Service: Gynecology;  Laterality: N/A;  . ROBOTIC ASSISTED SALPINGO OOPHERECTOMY Left 06/02/2012   Procedure: ROBOTIC  ASSISTED SALPINGO OOPHORECTOMY;  Surgeon: Marvene Staff, MD;  Location: [facility];  Service: Gynecology;  Laterality: Left;  pelvic washings  . ULNAR NERVE TRANSPOSITION Right 08/15/2013   Procedure: RIGHT ULNAR NERVE IN SITU DECOMPRESSION/POSSIBLE TRANSPOSITION;  Surgeon: Cammie Sickle., MD;  Location: [facility];  Service: Orthopedics;  Laterality: Right;     reports that  has never smoked. she has never used smokeless tobacco. She reports that she does not drink alcohol or use drugs.  No Known Allergies  Family History  Problem Relation Age of Onset  . Arthritis Mother   . Arthritis Father   . Hyperlipidemia Father   . Diabetes Father   . Hypertension Sister   . Mental illness Sister   . Diabetes Sister   . Diabetes Brother   . Cancer Paternal Aunt        BREAST    Prior to Admission medications   Medication Sig Start Date End Date Taking? Authorizing Provider  aspirin 81 MG tablet Take 81 mg by mouth daily.   Yes [provider]  DULoxetine (CYMBALTA) 60 MG capsule Take 60 mg by mouth daily. 05/07/17  Yes [provider]  FLUoxetine (PROZAC) 40 MG capsule Take 40 mg by mouth daily. 04/20/17  Yes [provider]  glimepiride (AMARYL) 1 MG tablet Take 1 tablet (1 mg total) by mouth daily before supper. 06/25/16  Yes Elayne Snare, MD  INVOKANA 100 MG TABS tablet TAKE 1 TABLET  BY MOUTH BEFORE BREAKFAST 04/20/17  Yes Elayne Snare, MD  lisinopril (PRINIVIL,ZESTRIL) 10 MG tablet TAKE 1 TABLET (10 MG TOTAL) BY MOUTH DAILY. 01/31/16  Yes Lucretia Kern, DO  meloxicam (MOBIC) 7.5 MG tablet Take 7.5 mg by mouth daily. 04/20/17  Yes [provider]  metFORMIN (GLUCOPHAGE-XR) 500 MG 24 hr tablet TAKE 4 TABLETS BY MOUTH DAILY WITH SUPPER. 09/28/16  Yes Elayne Snare, MD  naproxen (NAPROSYN) 500 MG tablet Take 500 mg by mouth every 12 (twelve) hours. 02/24/16  Yes [provider]  pravastatin (PRAVACHOL) 40 MG tablet Take 1 tablet (40 mg  total) by mouth daily. 09/28/16  Yes Elayne Snare, MD  topiramate (TOPAMAX) 25 MG tablet Take 25 mg by mouth 2 (two) times daily. 05/07/17  Yes [provider]  VICTOZA 18 MG/3ML SOPN INJECT 1.8 MG AS DIRECTED DAILY 01/29/17  Yes Elayne Snare, MD  Vitamin D, Ergocalciferol, (DRISDOL) 50000 units CAPS capsule Take 50,000 Units by mouth once a week. 02/23/17  Yes [provider]  glucose blood (ONETOUCH VERIO) test strip Use as instructed 06/25/16   Elayne Snare, MD  Insulin Pen Needle (B-D UF III MINI PEN NEEDLES) 31G X 5 MM MISC USE 1 daily TO INJECT VICTOZA 09/28/16   Elayne Snare, MD    Physical Exam: Vitals:   05/21/17 1130 05/21/17 1200 05/21/17 1230 05/21/17 1315  BP: 136/85 138/81 131/84 125/81  Pulse: (!) 115 (!) 107 (!) 114 (!) 106  Resp: (!) 24 (!) 26 (!) 21 18  Temp:      TempSrc:    Oral  SpO2: 100% 96% 98% 96%  Weight:      Height:          Constitutional: NAD, calm, comfortable Vitals:   05/21/17 1130 05/21/17 1200 05/21/17 1230 05/21/17 1315  BP: 136/85 138/81 131/84 125/81  Pulse: (!) 115 (!) 107 (!) 114 (!) 106  Resp: (!) 24 (!) 26 (!) 21 18  Temp:      TempSrc:    Oral  SpO2: 100% 96% 98% 96%  Weight:      Height:       Eyes: PERRL, lids and conjunctivae normal ENMT: Mucous membranes are moist. Posterior pharynx clear of any exudate or lesions.Normal dentition.  Neck: normal, supple, no masses, no thyromegaly Respiratory: clear to auscultation bilaterally, no wheezing, no crackles. Normal respiratory effort. No accessory muscle use.  Cardiovascular: Regular rate and rhythm, no murmurs / rubs / gallops. No extremity edema. 2+ pedal pulses. No carotid bruits.  Abdomen: no tenderness, no masses palpated. No hepatosplenomegaly. Bowel sounds positive.  Musculoskeletal: no clubbing / cyanosis. No joint deformity upper and lower extremities. Good ROM, no contractures. Normal muscle tone.  Skin: no rashes, lesions, ulcers. No induration Neurologic: CN 2-12  grossly intact. Sensation intact, DTR normal. Strength 5/5 in all 4.  Psychiatric: Normal judgment and insight. Alert and oriented x 3. Normal mood.    Labs on Admission: I have personally reviewed following labs and imaging studies  CBC: Recent Labs  Lab 05/21/17 1106 05/21/17 1115  WBC 10.6*  --   NEUTROABS 8.1*  --   HGB 12.1 14.3  HCT 37.5 42.0  MCV 72.0*  --   PLT 383  --    Basic Metabolic Panel: Recent Labs  Lab 05/21/17 1106 05/21/17 1115  NA 134* 134*  K 3.3* 3.3*  CL 94* 96*  CO2 23  --   GLUCOSE 169* 164*  BUN 9 7  CREATININE 0.92 0.90  CALCIUM 9.2  --    GFR: Estimated Creatinine Clearance: 65.5 mL/min (by C-G formula based on SCr of 0.9 mg/dL). Liver Function Tests: Recent Labs  Lab 05/21/17 1106  AST 33  ALT 36  ALKPHOS 119  BILITOT 0.7  PROT 8.1  ALBUMIN 3.5   No results for input(s): LIPASE, AMYLASE in the last 168 hours. No results for input(s): AMMONIA in the last 168 hours. Coagulation Profile: No results for input(s): INR, PROTIME in the last 168 hours. Cardiac Enzymes: No results for input(s): CKTOTAL, CKMB, CKMBINDEX, TROPONINI in the last 168 hours. BNP (last 3 results) No results for input(s): PROBNP in the last 8760 hours. HbA1C: No results for input(s): HGBA1C in the last 72 hours. CBG: No results for input(s): GLUCAP in the last 168 hours. Lipid Profile: No results for input(s): CHOL, HDL, LDLCALC, TRIG, CHOLHDL, LDLDIRECT in the last 72 hours. Thyroid Function Tests: No results for input(s): TSH, T4TOTAL, FREET4, T3FREE, THYROIDAB in the last 72 hours. Anemia Panel: No results for input(s): VITAMINB12, FOLATE, FERRITIN, TIBC, IRON, RETICCTPCT in the last 72 hours. Urine analysis:    Component Value Date/Time   COLORURINE YELLOW 05/21/2017 1106   APPEARANCEUR HAZY (A) 05/21/2017 1106   LABSPEC 1.022 05/21/2017 1106   PHURINE 5.0 05/21/2017 1106   GLUCOSEU >=500 (A) 05/21/2017 1106   GLUCOSEU NEGATIVE 02/11/2015 0805    HGBUR LARGE (A) 05/21/2017 1106   HGBUR trace-intact 07/06/2007 1428   BILIRUBINUR NEGATIVE 05/21/2017 1106   KETONESUR 5 (A) 05/21/2017 1106   PROTEINUR 30 (A) 05/21/2017 1106   UROBILINOGEN 0.2 02/11/2015 0805   NITRITE NEGATIVE 05/21/2017 1106   LEUKOCYTESUR MODERATE (A) 05/21/2017 1106   Sepsis Labs: !!!!!!!!!!!!!!!!!!!!!!!!!!!!!!!!!!!!!!!!!!!! @LABRCNTIP (procalcitonin:4,lacticidven:4) )No results found for this or any previous visit (from the past 240 hour(s)).   Radiological Exams on Admission: Ct Abdomen Pelvis Wo Contrast  Result Date: 05/20/2017 CLINICAL DATA:  Left flank pain for several days with nausea and vomiting EXAM: CT ABDOMEN AND PELVIS WITHOUT CONTRAST TECHNIQUE: Multidetector CT imaging of the abdomen and pelvis was performed following the standard protocol without oral or IV contrast. COMPARISON:  May 26, 2012 FINDINGS: Lower chest: Lung bases are clear. Hepatobiliary: No focal liver lesions are evident on this noncontrast enhanced study. Gallbladder is absent. There is no biliary duct dilatation. Pancreas: No pancreatic mass or inflammatory focus evident. Spleen: No splenic lesions are evident. Adrenals/Urinary Tract: Adrenals bilaterally appear normal. There is no renal mass on either side. There is no appreciable hydronephrosis on the right. There is severe hydronephrosis on the left. There is no intrarenal calculus on either side. There is ureterectasis on the left to the level of the mid sacrum. At this site, there is a 3 x 2 mm calculus, best seen on axial slice 64 series 2, coronal slice 61 series 3, and sagittal slice 76 series 4. No other ureteral calculi are evident. The urinary bladder is midline. There is air in the anterior aspect of the urinary bladder. Stomach/Bowel: Stomach is distended with fluid and to a lesser extent air. There is no appreciable bowel wall or mesenteric thickening. There is no bowel obstruction. No free air or portal venous air.  Vascular/Lymphatic: There is no abdominal aortic aneurysm. No vascular lesions are evident. No adenopathy is appreciable in the abdomen or pelvis. Reproductive: Uterus is absent. The previously noted left adnexal mass has been removed. There is currently no pelvic mass. Other: Appendix appears normal. No abscess or ascites is evident in the abdomen or pelvis. Musculoskeletal:  No blastic or lytic bone lesions are evident. There is no intramuscular or abdominal wall lesion. IMPRESSION: 1. There is severe hydronephrosis and proximal ureterectasis on the left. There is a 3 x 2 mm calculus in the left ureter at the mid sacral level. No other ureteral calculus evident. No intrarenal calculi on either side. It should be noted that given the degree of hydronephrosis on the left, a degree of superimposed pyelonephritis on the left cannot be excluded. No renal abscess or perinephric stranding seen. 2. There is air in the anterior urinary bladder. If patient has not been instrumented recently, this air must raise concern for gas-forming organism within the urine. Correlation with urinalysis advised. 3. No evident bowel obstruction. No abscess. Appendix appears normal. Note that stomach is moderately distended with fluid and air. 4.  Uterus and left ovary absent.  No evident pelvic mass. 5.  Gallbladder absent. These results will be called to the ordering clinician or representative by the Radiologist Assistant, and communication documented in the PACS or zVision Dashboard. Electronically Signed   By: Lowella Grip III M.D.   On: 05/20/2017 14:52   Dg Chest Port 1 View  Result Date: 05/21/2017 CLINICAL DATA:  Fever EXAM: PORTABLE CHEST 1 VIEW COMPARISON:  None. FINDINGS: Lungs are clear. Heart size and pulmonary vascularity are normal. No adenopathy. No bone lesions. IMPRESSION: No edema or consolidation. Electronically Signed   By: Lowella Grip III M.D.   On: 05/21/2017 12:29   Dg Abd Portable 1 View  Result  Date: 05/21/2017 CLINICAL DATA:  Left flank pain.  Left abdominal pain. EXAM: PORTABLE ABDOMEN - 1 VIEW COMPARISON:  CT 05/20/2017. FINDINGS: Surgical clips right upper quadrant. Soft tissue structures are unremarkable. Minimally prominent air-filled loops of small and large bowel are noted. Mild adynamic ileus cannot be excluded. Pelvic calcifications consistent phleboliths. No acute bony abnormality. IMPRESSION: Minimally prominent air-filled loops of small and large bowel noted. Mild adynamic ileus cannot be excluded. Electronically Signed   By: Marcello Moores  Register   On: 05/21/2017 12:30    Old chart reviewed Case discussed with EDP  Assessment/Plan 52 year old female with left-sided obstructive infected urinary stone with severe hydronephrosis and pyelonephritis  Principal Problem:   Urinary tract obstruction due to kidney stone-going to the OR today with urology for stent placement.  Placed on Zosyn.  Urine culture and blood cultures obtained.  Blood pressure stable at this time.  She was tachycardic on arrival however this is improved with 3 L of fluid  Active Problems:   Type 2 diabetes mellitus (HCC)-slidin scale insulin   HTN (hypertension)-stable   Pyelonephritis-IV Zosyn as above    DVT prophylaxis: SCDs Code Status: Full Family Communication: Husband Disposition Plan: Per day team Consults called: Urology Admission status: Admission   DAVID,RACHAL A MD [facility]  If 7PM-7AM, please contact night-coverage [email or web address] Password [email or web address]  05/21/2017, 1:17 PM

## 2017-05-21 NOTE — Transfer of Care (Signed)
Immediate Anesthesia Transfer of Care Note  Patient: [name]  Procedure(s) Performed: CYSTOSCOPY WITH RETROGRADE PYELOGRAM/URETERAL STENT PLACEMENT (Left )  Patient Location: PACU  Anesthesia Type:General  Level of Consciousness: awake, alert  and oriented  Airway & Oxygen Therapy: Patient connected to face mask oxygen  Post-op Assessment: Report given to RN and Post -op Vital signs reviewed and stable  Post vital signs: Reviewed  Last Vitals:  Vitals:   05/21/17 1230 05/21/17 1315  BP: 131/84 125/81  Pulse: (!) 114 (!) 106  Resp: (!) 21 18  Temp:    SpO2: 98% 96%    Last Pain:  Vitals:   05/21/17 1315  TempSrc: Oral  PainSc: 0-No pain         Complications: No apparent anesthesia complications

## 2017-05-22 DIAGNOSIS — N200 Calculus of kidney: Secondary | ICD-10-CM

## 2017-05-22 DIAGNOSIS — N138 Other obstructive and reflux uropathy: Secondary | ICD-10-CM

## 2017-05-22 LAB — BASIC METABOLIC PANEL
Anion gap: 13 (ref 5–15)
BUN: 11 mg/dL (ref 6–20)
CO2: 21 mmol/L — ABNORMAL LOW (ref 22–32)
Calcium: 8.6 mg/dL — ABNORMAL LOW (ref 8.9–10.3)
Chloride: 105 mmol/L (ref 101–111)
Creatinine, Ser: 0.72 mg/dL (ref 0.44–1.00)
GFR calc Af Amer: >60 mL/min (ref >60)
GFR calc non Af Amer: >60 mL/min (ref >60)
Glucose, Bld: 144 mg/dL — ABNORMAL HIGH (ref 65–99)
Potassium: 3.1 mmol/L — ABNORMAL LOW (ref 3.5–5.1)
Sodium: 139 mmol/L (ref 135–145)

## 2017-05-22 LAB — GLUCOSE, CAPILLARY
Glucose-Capillary: 102 mg/dL — ABNORMAL HIGH (ref 65–99)
Glucose-Capillary: 151 mg/dL — ABNORMAL HIGH (ref 65–99)
Glucose-Capillary: 113 mg/dL — ABNORMAL HIGH (ref 65–99)
Glucose-Capillary: 154 mg/dL — ABNORMAL HIGH (ref 65–99)

## 2017-05-22 LAB — CBC
HCT: 31.2 % — ABNORMAL LOW (ref 36.0–46.0)
Hemoglobin: 9.8 g/dL — ABNORMAL LOW (ref 12.0–15.0)
MCH: 22.7 pg — ABNORMAL LOW (ref 26.0–34.0)
MCHC: 31.4 g/dL (ref 30.0–36.0)
MCV: 72.4 fL — ABNORMAL LOW (ref 78.0–100.0)
Platelets: 320 10*3/uL (ref 150–400)
RBC: 4.31 MIL/uL (ref 3.87–5.11)
RDW: 14.9 % (ref 11.5–15.5)
WBC: 14.7 10*3/uL — ABNORMAL HIGH (ref 4.0–10.5)

## 2017-05-22 MED ORDER — PHENOL 1.4 % MT LIQD
1.0000 | OROMUCOSAL | Status: DC | PRN
  Filled 2017-05-22: qty 177

## 2017-05-22 MED ORDER — POTASSIUM CHLORIDE CRYS ER 20 MEQ PO TBCR
40.0000 meq | EXTENDED_RELEASE_TABLET | Freq: Once | ORAL | Status: AC
  Administered 2017-05-22: 40 meq via ORAL
  Filled 2017-05-22: qty 2

## 2017-05-22 MED ORDER — ASPIRIN EC 81 MG PO TBEC
81.0000 mg | DELAYED_RELEASE_TABLET | Freq: Every day | ORAL | Status: DC
  Administered 2017-05-22 – 2017-05-23 (×2): 81 mg via ORAL
  Filled 2017-05-22 (×2): qty 1

## 2017-05-22 NOTE — Progress Notes (Signed)
PROGRESS NOTE  [name] GYF:[id number] DOB: 27-Oct-1965 DOA: 05/21/2017 PCP: Lois Huxley, PA  HPI/Recap of past 24 hours:  Status post L ureteral stent placement yesterday Fever resolved, heart rate normalized, blood pressure stable  She reports sore throat after the procedure  Assessment/Plan: Principal Problem:   Urinary tract obstruction due to kidney stone Active Problems:   Type 2 diabetes mellitus (HCC)   HTN (hypertension)   Pyelonephritis  Left ureteral stone/UTI/Sepsis presented on presentation -She presented with fever of 102, tachycardia heart rate 139, leukocytosis WBC 14.7 -Status post left ureteral stent placement on the 22nd -Blood culture no growth, culture in process -Continue Zosyn for now, adjust antibiotic according to culture and sensitivity.  Keep follow-up with urology 1-2 weeks for definitive stone management  Hypokalemia Replace K ,check mag  Microcytic anemia  MCV 72, hemoglobin 9.8 Suspect blood loss from urine We will check iron study/B12/folate  Non-insulin-dependent type 2 diabetes Home meds on hold, she is on Ssi Check A1c  History of hypertension, blood pressure medication held on admission due to sepsis Plan to resume in a.m. if stable  Code Status: Full  Family Communication: patient   Disposition Plan: Home 1-2 days, ending culture result   Consultants:  Urology  Procedures:  left ureteral stent placement on the 22nd  Antibiotics:  Zosyn since admission   Objective: BP 140/84 (BP Location: Right Arm)   Pulse 85   Temp 98 F (36.7 C) (Oral)   Resp 16   Ht 5\' 2"  (1.575 m)   Wt 67.9 kg (149 lb 12.8 oz)   SpO2 97%   BMI 27.40 kg/m   Intake/Output Summary (Last 24 hours) at 05/22/2017 0756 Last data filed at 05/22/2017 0719 Gross per 24 hour  Intake 3631.67 ml  Output 800 ml  Net 2831.67 ml   Filed Weights   05/21/17 1116 05/21/17 1547 05/22/17 0428  Weight: 66.7 kg (147 lb) 68.1 kg (150 lb 1.6 oz)  67.9 kg (149 lb 12.8 oz)    Exam: Patient is examined daily including today on 05/22/2017, exams remain the same as of yesterday except that has changed    General:  NAD  Cardiovascular: RRR  Respiratory: CTABL  Abdomen: Soft/ND/NT, positive BS  Musculoskeletal: No Edema  Neuro: alert, oriented   Data Reviewed: Basic Metabolic Panel: Recent Labs  Lab 05/21/17 1106 05/21/17 1115 05/22/17 0600  NA 134* 134* 139  K 3.3* 3.3* 3.1*  CL 94* 96* 105  CO2 23  --  21*  GLUCOSE 169* 164* 144*  BUN 9 7 11   CREATININE 0.92 0.90 0.72  CALCIUM 9.2  --  8.6*   Liver Function Tests: Recent Labs  Lab 05/21/17 1106  AST 33  ALT 36  ALKPHOS 119  BILITOT 0.7  PROT 8.1  ALBUMIN 3.5   No results for input(s): LIPASE, AMYLASE in the last 168 hours. No results for input(s): AMMONIA in the last 168 hours. CBC: Recent Labs  Lab 05/21/17 1106 05/21/17 1115 05/22/17 0600  WBC 10.6*  --  14.7*  NEUTROABS 8.1*  --   --   HGB 12.1 14.3 9.8*  HCT 37.5 42.0 31.2*  MCV 72.0*  --  72.4*  PLT 383  --  320   Cardiac Enzymes:   No results for input(s): CKTOTAL, CKMB, CKMBINDEX, TROPONINI in the last 168 hours. BNP (last 3 results) No results for input(s): BNP in the last 8760 hours.  ProBNP (last 3 results) No results for input(s): PROBNP in  the last 8760 hours.  CBG: Recent Labs  Lab 05/21/17 1322 05/21/17 1659 05/21/17 2147 05/22/17 0732  GLUCAP 127* 164* 171* 102*    No results found for this or any previous visit (from the past 240 hour(s)).   Studies: Dg Chest Port 1 View  Result Date: 05/21/2017 CLINICAL DATA:  Fever EXAM: PORTABLE CHEST 1 VIEW COMPARISON:  None. FINDINGS: Lungs are clear. Heart size and pulmonary vascularity are normal. No adenopathy. No bone lesions. IMPRESSION: No edema or consolidation. Electronically Signed   By: Lowella Grip III M.D.   On: 05/21/2017 12:29   Dg Abd Portable 1 View  Result Date: 05/21/2017 CLINICAL DATA:  Left flank  pain.  Left abdominal pain. EXAM: PORTABLE ABDOMEN - 1 VIEW COMPARISON:  CT 05/20/2017. FINDINGS: Surgical clips right upper quadrant. Soft tissue structures are unremarkable. Minimally prominent air-filled loops of small and large bowel are noted. Mild adynamic ileus cannot be excluded. Pelvic calcifications consistent phleboliths. No acute bony abnormality. IMPRESSION: Minimally prominent air-filled loops of small and large bowel noted. Mild adynamic ileus cannot be excluded. Electronically Signed   By: Marcello Moores  Register   On: 05/21/2017 12:30   Dg C-arm 1-60 Min-no Report  Result Date: 05/21/2017 Fluoroscopy was utilized by the requesting physician.  No radiographic interpretation.    Scheduled Meds: . DULoxetine  60 mg Oral Daily  . FLUoxetine  40 mg Oral Daily  . insulin aspart  0-9 Units Subcutaneous TID WC  . potassium chloride  40 mEq Oral Once  . pravastatin  40 mg Oral Daily  . sodium chloride flush  3 mL Intravenous Q12H  . topiramate  25 mg Oral BID    Continuous Infusions: . sodium chloride    . sodium chloride 100 mL/hr at 05/22/17 0211  . piperacillin-tazobactam (ZOSYN)  IV 3.375 g (05/22/17 0559)     Time spent: 51mins I have personally reviewed and interpreted on  05/22/2017 daily labs,  imagings as discussed above under date review session and assessment and plans.  I reviewed all nursing notes, pharmacy notes, consultant notes,  vitals, pertinent old records  I have discussed plan of care as described above with RN , patient on 05/22/2017   Florencia Reasons MD, PhD  [facility] Pager [phone]. If 7PM-7AM, please contact night-coverage at [email or web address], password [facility] 05/22/2017, 7:56 AM  LOS: 1 day

## 2017-05-22 NOTE — Progress Notes (Signed)
No events overnight Slight increase in WBC to 14 Vitals/Temp improved Feels much better  Vitals:   05/21/17 1547 05/21/17 2140 05/22/17 0258 05/22/17 0428  BP: 114/77 136/76 (!) 145/80 140/84  Pulse: 98 86 77 85  Resp: 18 20 20 16   Temp: 98.8 F (37.1 C) (!) 97.4 F (36.3 C) 98 F (36.7 C) 98 F (36.7 C)  TempSrc: Oral Oral Oral Oral  SpO2: 93% 98% 100% 97%  Weight: 150 lb 1.6 oz (68.1 kg)   149 lb 12.8 oz (67.9 kg)  Height:       I/O last 3 completed shifts: In: 3631.7 [I.V.:1481.7; IV Piggyback:2150] Out: 600 [Urine:600] Total I/O In: 240 [P.O.:240] Out: 200 [Urine:200]   NAD Soft nt nd No foley  CBC    Component Value Date/Time   WBC 14.7 (H) 05/22/2017 0600   RBC 4.31 05/22/2017 0600   HGB 9.8 (L) 05/22/2017 0600   HCT 31.2 (L) 05/22/2017 0600   PLT 320 05/22/2017 0600   MCV 72.4 (L) 05/22/2017 0600   MCH 22.7 (L) 05/22/2017 0600   MCHC 31.4 05/22/2017 0600   RDW 14.9 05/22/2017 0600   LYMPHSABS 1.1 05/21/2017 1106   MONOABS 1.4 (H) 05/21/2017 1106   EOSABS 0.0 05/21/2017 1106   BASOSABS 0.0 05/21/2017 1106   BMP Latest Ref Rng & Units 05/22/2017 05/21/2017 05/21/2017  Glucose 65 - 99 mg/dL 144(H) 164(H) 169(H)  BUN 6 - 20 mg/dL 11 7 9   Creatinine 0.44 - 1.00 mg/dL 0.72 0.90 0.92  Sodium 135 - 145 mmol/L 139 134(L) 134(L)  Potassium 3.5 - 5.1 mmol/L 3.1(L) 3.3(L) 3.3(L)  Chloride 101 - 111 mmol/L 105 96(L) 94(L)  CO2 22 - 32 mmol/L 21(L) - 23  Calcium 8.9 - 10.3 mg/dL 8.6(L) - 9.2    POD 1 L ureteral stent for sepsis, left ureteral stone. Patient improving -continue abx pending c/s -no further urologic intervention during this admission -f/u in 1-2 weeks to discuss definitive stone management -urology will sign off. Call with questions.

## 2017-05-23 LAB — CBC WITH DIFFERENTIAL/PLATELET
Basophils Absolute: 0 10*3/uL (ref 0.0–0.1)
Basophils Relative: 0 %
Eosinophils Absolute: 0.1 10*3/uL (ref 0.0–0.7)
Eosinophils Relative: 1 %
HCT: 32 % — ABNORMAL LOW (ref 36.0–46.0)
Hemoglobin: 10.2 g/dL — ABNORMAL LOW (ref 12.0–15.0)
Lymphocytes Relative: 17 %
Lymphs Abs: 1.8 10*3/uL (ref 0.7–4.0)
MCH: 22.8 pg — ABNORMAL LOW (ref 26.0–34.0)
MCHC: 31.9 g/dL (ref 30.0–36.0)
MCV: 71.6 fL — ABNORMAL LOW (ref 78.0–100.0)
Monocytes Absolute: 1.1 10*3/uL — ABNORMAL HIGH (ref 0.1–1.0)
Monocytes Relative: 10 %
Neutro Abs: 7.6 10*3/uL (ref 1.7–7.7)
Neutrophils Relative %: 72 %
Platelets: 353 10*3/uL (ref 150–400)
RBC: 4.47 MIL/uL (ref 3.87–5.11)
RDW: 15 % (ref 11.5–15.5)
WBC: 10.6 10*3/uL — ABNORMAL HIGH (ref 4.0–10.5)

## 2017-05-23 LAB — BASIC METABOLIC PANEL
Anion gap: 11 (ref 5–15)
BUN: 7 mg/dL (ref 6–20)
CO2: 22 mmol/L (ref 22–32)
Calcium: 8.5 mg/dL — ABNORMAL LOW (ref 8.9–10.3)
Chloride: 108 mmol/L (ref 101–111)
Creatinine, Ser: 0.66 mg/dL (ref 0.44–1.00)
GFR calc Af Amer: >60 mL/min (ref >60)
GFR calc non Af Amer: >60 mL/min (ref >60)
Glucose, Bld: 106 mg/dL — ABNORMAL HIGH (ref 65–99)
Potassium: 3.5 mmol/L (ref 3.5–5.1)
Sodium: 141 mmol/L (ref 135–145)

## 2017-05-23 LAB — FOLATE: Folate: 12 ng/mL (ref >5.9)

## 2017-05-23 LAB — URINE CULTURE
Culture: >=100000 — AB
Culture: 10000 — AB

## 2017-05-23 LAB — VITAMIN B12: Vitamin B-12: 171 pg/mL — ABNORMAL LOW (ref 180–914)

## 2017-05-23 LAB — GLUCOSE, CAPILLARY: Glucose-Capillary: 168 mg/dL — ABNORMAL HIGH (ref 65–99)

## 2017-05-23 LAB — IRON AND TIBC
Iron: 49 ug/dL (ref 28–170)
Saturation Ratios: 19 % (ref 10.4–31.8)
TIBC: 260 ug/dL (ref 250–450)
UIBC: 211 ug/dL

## 2017-05-23 LAB — MAGNESIUM: Magnesium: 1.9 mg/dL (ref 1.7–2.4)

## 2017-05-23 MED ORDER — CIPROFLOXACIN HCL 500 MG PO TABS: 500.0000 mg | ORAL_TABLET | Freq: Two times a day (BID) | ORAL | Status: DC

## 2017-05-23 MED ORDER — NAPROXEN 500 MG PO TABS: 500.0000 mg | ORAL_TABLET | Freq: Two times a day (BID) | ORAL | 0 refills | Status: DC | PRN

## 2017-05-23 MED ORDER — SULFAMETHOXAZOLE-TRIMETHOPRIM 800-160 MG PO TABS
1.0000 | ORAL_TABLET | Freq: Two times a day (BID) | ORAL | Status: DC
  Administered 2017-05-23: 1 via ORAL
  Filled 2017-05-23: qty 1

## 2017-05-23 MED ORDER — VITAMIN B-12 1000 MCG PO TABS: 1000.0000 ug | ORAL_TABLET | Freq: Every day | ORAL | 0 refills | Status: DC

## 2017-05-23 MED ORDER — SULFAMETHOXAZOLE-TRIMETHOPRIM 800-160 MG PO TABS: 1.0000 | ORAL_TABLET | Freq: Two times a day (BID) | ORAL | 0 refills | Status: AC

## 2017-05-23 MED ORDER — KETOROLAC TROMETHAMINE 15 MG/ML IJ SOLN
7.5000 mg | Freq: Once | INTRAMUSCULAR | Status: AC
  Administered 2017-05-23: 7.5 mg via INTRAVENOUS
  Filled 2017-05-23: qty 1

## 2017-05-23 MED ORDER — POTASSIUM CHLORIDE CRYS ER 20 MEQ PO TBCR
40.0000 meq | EXTENDED_RELEASE_TABLET | Freq: Once | ORAL | Status: AC
  Administered 2017-05-23: 40 meq via ORAL
  Filled 2017-05-23: qty 2

## 2017-05-23 MED ORDER — SULFAMETHOXAZOLE-TRIMETHOPRIM 800-160 MG PO TABS: 1.0000 | ORAL_TABLET | Freq: Two times a day (BID) | ORAL | 0 refills | Status: DC

## 2017-05-23 NOTE — Anesthesia Postprocedure Evaluation (Signed)
Anesthesia Post Note  Patient: Earnie Rockhold  Procedure(s) Performed: CYSTOSCOPY WITH RETROGRADE PYELOGRAM/URETERAL STENT PLACEMENT (Left )     Patient location during evaluation: PACU Anesthesia Type: General Level of consciousness: awake and alert Pain management: pain level controlled Vital Signs Assessment: post-procedure vital signs reviewed and stable Respiratory status: spontaneous breathing, nonlabored ventilation, respiratory function stable and patient connected to nasal cannula oxygen Cardiovascular status: blood pressure returned to baseline and stable Postop Assessment: no apparent nausea or vomiting Anesthetic complications: no    Last Vitals:  Vitals:   05/22/17 2315 05/23/17 0529  BP: 122/75 (!) 147/83  Pulse: 77 83  Resp: 20 20  Temp: 36.6 C 36.8 C  SpO2: 98% 98%    Last Pain:  Vitals:   05/23/17 1126  TempSrc:   PainSc: 6                  Lynda Rainwater

## 2017-05-23 NOTE — Progress Notes (Signed)
Pt discharged from the unit via wheelchair. Discharge instructions reviewed with pt.

## 2017-05-23 NOTE — Discharge Summary (Signed)
Discharge Summary  [name] VQM:[id number] DOB: 1966-03-28  PCP: Lois Huxley, PA  Admit date: 05/21/2017 Discharge date: 05/23/2017  Time spent: <20mins  Recommendations for Outpatient Follow-up:  1. F/u with PMD within a week  for hospital discharge follow up, repeat cbc/bmp at follow up. 2. F/u with urology in one week for definitive stone treatment  Discharge Diagnoses:  Active Hospital Problems   Diagnosis Date Noted  . Urinary tract obstruction due to kidney stone 05/21/2017  . Pyelonephritis   . HTN (hypertension) 05/02/2013  . Type 2 diabetes mellitus ([place]) 12/28/2012    Resolved Hospital Problems  No resolved problems to display.    Discharge Condition: stable  Diet recommendation: heart healthy/carb modified  Filed Weights   05/21/17 1547 05/22/17 0428 05/23/17 0529  Weight: 68.1 kg (150 lb 1.6 oz) 67.9 kg (149 lb 12.8 oz) 67.4 kg (148 lb 8 oz)    History of present illness:  PCP: Lois Huxley, PA  Patient coming from: Home  Chief Complaint: Left flank pain and fever  HPI: [name] is a 52 y.o. female with medical history significant of diabetes, hypertension comes in with over a week of left flank pain that is progressively gotten worse.  She went to see her primary care physician yesterday imaging showed a stone in her left ureter with severe hydronephrosis.  Patient was sent home no antibiotics were started.  This morning she woke up with high fever and feeling worse so she came to the emergency department.  Patient's been having hematuria and dysuria for the last several days also.  She denies any nausea vomiting diarrhea.  Urology has been called was taken to the OR today for stent placement.  Patient is being referred for an obstructive infected urinary stone on the left side and pyelonephritis.     Hospital Course:  Principal Problem:   Urinary tract obstruction due to kidney stone Active Problems:   Type 2 diabetes mellitus (HCC)   HTN  (hypertension)   Pyelonephritis  Left ureteral stone/UTI/Sepsis presented on presentation -She presented with fever of 102, tachycardia heart rate 139, leukocytosis WBC 14.7 -Status post left ureteral stent placement on the 22nd -Blood culture no growth, urine culture positive for pansensitive E. coli and Citrobacter -She is treated with Zosyn since admission , she was discharged on Bactrim.  - follow-up with urology 1-2 weeks for definitive stone management  Hypokalemia Replaced K ,mag 1.9  Microcytic anemia  -MCV 72, hemoglobin 9.8 -Suspect blood loss from urine - iron study/foalte unremarkable, B12 slightly below normal at 171, start b12 supplement.  Non-insulin-dependent type 2 diabetes Home meds Amaryl, Invokana, metformin, Victoza held in the hospital and resumed at discharge. she received SSI in the hospital. Check A1c pending at time of discharge. She is to follow up with pmd and endocrinology.  hypertension, blood pressure medication held on admission due to sepsis, resume at discharge.   QTC prolongation Qtc 538 on admission with tachycardia Tachycardia has resolved, expect improvement. She is to follow up with pmd to repeat ekg for Qtc monitoring.  She reports has been on Cymbalta and Zoloft  Enlarged thyroid She was told no cancer, tsh a few month ago wnl. She is to follow with pmd and endocrinology for this   Code Status: Full  Family Communication: patient   Disposition Plan: Home    Consultants:  Urology  Procedures:  left ureteral stent placement on 2/ 22 by Dr Pilar Jarvis  Antibiotics:  Zosyn since admission  Discharge Exam: BP (!) 147/83 (BP Location: Left Arm)   Pulse 83   Temp 98.3 F (36.8 C) (Oral)   Resp 20   Ht 5\' 2"  (1.575 m)   Wt 67.4 kg (148 lb 8 oz)   SpO2 98%   BMI 27.16 kg/m   General: NAD, enlarged thyroid, nontender  Cardiovascular: RRR Respiratory: CTABL  Discharge Instructions You were cared for by a  hospitalist during your hospital stay. If you have any questions about your discharge medications or the care you received while you were in the hospital after you are discharged, you can call the unit and asked to speak with the hospitalist on call if the hospitalist that took care of you is not available. Once you are discharged, your primary care physician will handle any further medical issues. Please note that NO REFILLS for any discharge medications will be authorized once you are discharged, as it is imperative that you return to your primary care physician (or establish a relationship with a primary care physician if you do not have one) for your aftercare needs so that they can reassess your need for medications and monitor your lab values.  Discharge Instructions    Diet - low sodium heart healthy   Complete by:  As directed    Carb modified   Increase activity slowly   Complete by:  As directed      Allergies as of 05/23/2017   No Known Allergies     Medication List    STOP taking these medications   meloxicam 7.5 MG tablet Commonly known as:  MOBIC     TAKE these medications   aspirin 81 MG tablet Take 81 mg by mouth daily.   DULoxetine 60 MG capsule Commonly known as:  CYMBALTA Take 60 mg by mouth daily.   FLUoxetine 40 MG capsule Commonly known as:  PROZAC Take 40 mg by mouth daily.   glimepiride 1 MG tablet Commonly known as:  AMARYL Take 1 tablet (1 mg total) by mouth daily before supper.   glucose blood test strip Commonly known as:  ONETOUCH VERIO Use as instructed   Insulin Pen Needle 31G X 5 MM Misc Commonly known as:  B-D UF III MINI PEN NEEDLES USE 1 daily TO INJECT VICTOZA   INVOKANA 100 MG Tabs tablet Generic drug:  canagliflozin TAKE 1 TABLET BY MOUTH BEFORE BREAKFAST   lisinopril 10 MG tablet Commonly known as:  PRINIVIL,ZESTRIL TAKE 1 TABLET (10 MG TOTAL) BY MOUTH DAILY.   metFORMIN 500 MG 24 hr tablet Commonly known as:   GLUCOPHAGE-XR TAKE 4 TABLETS BY MOUTH DAILY WITH SUPPER.   naproxen 500 MG tablet Commonly known as:  NAPROSYN Take 1 tablet (500 mg total) by mouth every 12 (twelve) hours as needed for mild pain. What changed:    when to take this  reasons to take this   pravastatin 40 MG tablet Commonly known as:  PRAVACHOL Take 1 tablet (40 mg total) by mouth daily.   sulfamethoxazole-trimethoprim 800-160 MG tablet Commonly known as:  BACTRIM DS,SEPTRA DS Take 1 tablet by mouth every 12 (twelve) hours for 7 days.   topiramate 25 MG tablet Commonly known as:  TOPAMAX Take 25 mg by mouth 2 (two) times daily.   VICTOZA 18 MG/3ML Sopn Generic drug:  liraglutide INJECT 1.8 MG AS DIRECTED DAILY   vitamin B-12 1000 MCG tablet Commonly known as:  CYANOCOBALAMIN Take 1 tablet (1,000 mcg total) by mouth daily.   Vitamin D (Ergocalciferol)  50000 units Caps capsule Commonly known as:  DRISDOL Take 50,000 Units by mouth once a week.      No Known Allergies Follow-up Information    Nickie Retort, MD Follow up in 1 week(s).   Specialty:  Urology Why:  Discuss defintive stone surgery Contact information: [place] [address] [phone]        Lois Huxley, PA Follow up.   Specialty:  Family Medicine Why:  hospital discharge follow up, repeat cbc/bmp at follow up. pmd to repeat EKG to monitor QTc interval Contact information: [place] [address] [phone]            The results of significant diagnostics from this hospitalization (including imaging, microbiology, ancillary and laboratory) are listed below for reference.    Significant Diagnostic Studies: Ct Abdomen Pelvis Wo Contrast  Result Date: 05/20/2017 CLINICAL DATA:  Left flank pain for several days with nausea and vomiting EXAM: CT ABDOMEN AND PELVIS WITHOUT CONTRAST TECHNIQUE: Multidetector CT imaging of the abdomen and pelvis was performed following the standard  protocol without oral or IV contrast. COMPARISON:  May 26, 2012 FINDINGS: Lower chest: Lung bases are clear. Hepatobiliary: No focal liver lesions are evident on this noncontrast enhanced study. Gallbladder is absent. There is no biliary duct dilatation. Pancreas: No pancreatic mass or inflammatory focus evident. Spleen: No splenic lesions are evident. Adrenals/Urinary Tract: Adrenals bilaterally appear normal. There is no renal mass on either side. There is no appreciable hydronephrosis on the right. There is severe hydronephrosis on the left. There is no intrarenal calculus on either side. There is ureterectasis on the left to the level of the mid sacrum. At this site, there is a 3 x 2 mm calculus, best seen on axial slice 64 series 2, coronal slice 61 series 3, and sagittal slice 76 series 4. No other ureteral calculi are evident. The urinary bladder is midline. There is air in the anterior aspect of the urinary bladder. Stomach/Bowel: Stomach is distended with fluid and to a lesser extent air. There is no appreciable bowel wall or mesenteric thickening. There is no bowel obstruction. No free air or portal venous air. Vascular/Lymphatic: There is no abdominal aortic aneurysm. No vascular lesions are evident. No adenopathy is appreciable in the abdomen or pelvis. Reproductive: Uterus is absent. The previously noted left adnexal mass has been removed. There is currently no pelvic mass. Other: Appendix appears normal. No abscess or ascites is evident in the abdomen or pelvis. Musculoskeletal: No blastic or lytic bone lesions are evident. There is no intramuscular or abdominal wall lesion. IMPRESSION: 1. There is severe hydronephrosis and proximal ureterectasis on the left. There is a 3 x 2 mm calculus in the left ureter at the mid sacral level. No other ureteral calculus evident. No intrarenal calculi on either side. It should be noted that given the degree of hydronephrosis on the left, a degree of  superimposed pyelonephritis on the left cannot be excluded. No renal abscess or perinephric stranding seen. 2. There is air in the anterior urinary bladder. If patient has not been instrumented recently, this air must raise concern for gas-forming organism within the urine. Correlation with urinalysis advised. 3. No evident bowel obstruction. No abscess. Appendix appears normal. Note that stomach is moderately distended with fluid and air. 4.  Uterus and left ovary absent.  No evident pelvic mass. 5.  Gallbladder absent. These results will be called to the ordering clinician or representative  by the Radiologist Assistant, and communication documented in the PACS or zVision Dashboard. Electronically Signed   By: Lowella Grip III M.D.   On: 05/20/2017 14:52   Dg Chest Port 1 View  Result Date: 05/21/2017 CLINICAL DATA:  Fever EXAM: PORTABLE CHEST 1 VIEW COMPARISON:  None. FINDINGS: Lungs are clear. Heart size and pulmonary vascularity are normal. No adenopathy. No bone lesions. IMPRESSION: No edema or consolidation. Electronically Signed   By: Lowella Grip III M.D.   On: 05/21/2017 12:29   Dg Abd Portable 1 View  Result Date: 05/21/2017 CLINICAL DATA:  Left flank pain.  Left abdominal pain. EXAM: PORTABLE ABDOMEN - 1 VIEW COMPARISON:  CT 05/20/2017. FINDINGS: Surgical clips right upper quadrant. Soft tissue structures are unremarkable. Minimally prominent air-filled loops of small and large bowel are noted. Mild adynamic ileus cannot be excluded. Pelvic calcifications consistent phleboliths. No acute bony abnormality. IMPRESSION: Minimally prominent air-filled loops of small and large bowel noted. Mild adynamic ileus cannot be excluded. Electronically Signed   By: Marcello Moores  Register   On: 05/21/2017 12:30   Dg C-arm 1-60 Min-no Report  Result Date: 05/21/2017 Fluoroscopy was utilized by the requesting physician.  No radiographic interpretation.    Microbiology: Recent Results (from the past 240  hour(s))  Blood Culture (routine x 2)     Status: None (Preliminary result)   Collection Time: 05/21/17 11:06 AM  Result Value Ref Range Status   Specimen Description   Final    BLOOD RIGHT ANTECUBITAL Performed at [facility] [address], [place], [place] [address]    Special Requests   Final    BOTTLES DRAWN AEROBIC AND ANAEROBIC Blood Culture adequate volume Performed at [facility] [address], [place], [place] [address]    Culture   Final    NO GROWTH 1 DAY Performed at [facility], [place] [address], [place], [place] [address]    Report Status PENDING  Incomplete  Urine culture     Status: Abnormal   Collection Time: 05/21/17 11:06 AM  Result Value Ref Range Status   Specimen Description   Final    URINE, RANDOM Performed at [facility] [address], [place], [place] [address]    Special Requests   Final    NONE Performed at [facility], [place] [address], [place], [place] [address]    Culture >=100,000 COLONIES/mL ESCHERICHIA COLI (A)  Final   Report Status 05/23/2017 FINAL  Final   Organism ID, Bacteria ESCHERICHIA COLI (A)  Final      Susceptibility   Escherichia coli - MIC*    AMPICILLIN 8 SENSITIVE Sensitive     CEFAZOLIN <=4 SENSITIVE Sensitive     CEFTRIAXONE <=1 SENSITIVE Sensitive     CIPROFLOXACIN <=0.25 SENSITIVE Sensitive     GENTAMICIN <=1 SENSITIVE Sensitive     IMIPENEM <=0.25 SENSITIVE Sensitive     NITROFURANTOIN <=16 SENSITIVE Sensitive     TRIMETH/SULFA <=20 SENSITIVE Sensitive     AMPICILLIN/SULBACTAM 4 SENSITIVE Sensitive     PIP/TAZO <=4 SENSITIVE Sensitive     Extended ESBL NEGATIVE Sensitive     * >=100,000 COLONIES/mL ESCHERICHIA COLI  Blood Culture (routine x 2)     Status: None (Preliminary result)   Collection Time: 05/21/17 11:08 AM  Result Value Ref Range Status   Specimen Description   Final    BLOOD LEFT ANTECUBITAL Performed at [facility] [address] [place],  [place] [address]    Special Requests   Final    BOTTLES DRAWN AEROBIC AND ANAEROBIC Blood Culture adequate volume Performed at [facility] [address] [place], [place] [address]    Culture   Final    NO GROWTH 1 DAY Performed at [facility], [place] [address] [place], [place] [address]    Report Status PENDING  Incomplete  Urine Culture     Status: Abnormal   Collection Time: 05/21/17  2:15 PM  Result Value Ref Range Status   Specimen Description   Final    URINE, RANDOM CYSTOSCOPY Performed at [facility] [address], [place], [place] [address]    Special Requests   Final    NONE Performed at [facility], [place] [address] [place], [place] [address]    Culture 10,000 COLONIES/mL CITROBACTER KOSERI (A)  Final   Report Status 05/23/2017 FINAL  Final   Organism ID, Bacteria CITROBACTER KOSERI (A)  Final      Susceptibility   Citrobacter koseri - MIC*    CEFAZOLIN <=4 SENSITIVE Sensitive     CEFTRIAXONE <=1 SENSITIVE Sensitive     CIPROFLOXACIN <=0.25 SENSITIVE Sensitive     GENTAMICIN <=1 SENSITIVE Sensitive     IMIPENEM <=0.25 SENSITIVE Sensitive     NITROFURANTOIN <=16 SENSITIVE Sensitive     TRIMETH/SULFA <=20 SENSITIVE Sensitive     PIP/TAZO <=4 SENSITIVE Sensitive     * 10,000 COLONIES/mL CITROBACTER KOSERI     Labs: Basic Metabolic Panel: Recent Labs  Lab 05/21/17 1106 05/21/17 1115 05/22/17 0600 05/23/17 0539  NA 134* 134* 139 141  K 3.3* 3.3* 3.1* 3.5  CL 94* 96* 105 108  CO2 23  --  21* 22  GLUCOSE 169* 164* 144* 106*  BUN 9 7 11 7   CREATININE 0.92 0.90 0.72 0.66  CALCIUM 9.2  --  8.6* 8.5*  MG  --   --   --  1.9   Liver Function Tests: Recent Labs  Lab 05/21/17 1106  AST 33  ALT 36  ALKPHOS 119  BILITOT 0.7  PROT 8.1  ALBUMIN 3.5   No results for input(s): LIPASE, AMYLASE in the last 168 hours. No results for input(s):  AMMONIA in the last 168 hours. CBC: Recent Labs  Lab 05/21/17 1106 05/21/17 1115 05/22/17 0600 05/23/17 0539  WBC 10.6*  --  14.7* 10.6*  NEUTROABS 8.1*  --   --  7.6  HGB 12.1 14.3 9.8* 10.2*  HCT 37.5 42.0 31.2* 32.0*  MCV 72.0*  --  72.4* 71.6*  PLT 383  --  320 353   Cardiac Enzymes: No results for input(s): CKTOTAL, CKMB, CKMBINDEX, TROPONINI in the last 168 hours. BNP: BNP (last 3 results) No results for input(s): BNP in the last 8760 hours.  ProBNP (last 3 results) No results for input(s): PROBNP in the last 8760 hours.  CBG: Recent Labs  Lab 05/22/17 0732 05/22/17 1207 05/22/17 1753 05/22/17 2319 05/23/17 1154  GLUCAP 102* 151* 113* 154* 168*       Signed:  Florencia Reasons MD, PhD  [facility] 05/23/2017, 1:42 PM

## 2017-05-24 LAB — GLUCOSE, CAPILLARY: Glucose-Capillary: 103 mg/dL — ABNORMAL HIGH (ref 65–99)

## 2017-05-24 LAB — HEMOGLOBIN A1C
Hgb A1c MFr Bld: 7.1 % — ABNORMAL HIGH (ref 4.8–5.6)
Mean Plasma Glucose: 157 mg/dL

## 2017-05-24 MED FILL — SULFAMETHOXAZOLE-TMP DS TAB: 800-160 | 7 days supply | Qty: 14 | Fill #0

## 2017-05-24 MED FILL — NAPROXEN 500 MG TABLET: 500 | 5 days supply | Qty: 10 | Fill #0

## 2017-05-25 DIAGNOSIS — D509 Iron deficiency anemia, unspecified: Secondary | ICD-10-CM | POA: Diagnosis not present

## 2017-05-25 DIAGNOSIS — R9431 Abnormal electrocardiogram [ECG] [EKG]: Secondary | ICD-10-CM | POA: Diagnosis not present

## 2017-05-25 DIAGNOSIS — E876 Hypokalemia: Secondary | ICD-10-CM | POA: Diagnosis not present

## 2017-05-25 DIAGNOSIS — N200 Calculus of kidney: Secondary | ICD-10-CM | POA: Diagnosis not present

## 2017-05-26 ENCOUNTER — Telehealth (HOSPITAL_BASED_OUTPATIENT_CLINIC_OR_DEPARTMENT_OTHER): Payer: Self-pay | Admitting: Emergency Medicine

## 2017-05-26 LAB — BLOOD CULTURE ID PANEL (REFLEXED)

## 2017-05-26 LAB — CULTURE, BLOOD (ROUTINE X 2)
Culture: NO GROWTH
Special Requests: ADEQUATE

## 2017-05-26 NOTE — Telephone Encounter (Signed)
Report of positive blood culture gram positive rods received. Given to Dr Florina Ou. No new orders.

## 2017-05-27 LAB — CULTURE, BLOOD (ROUTINE X 2): Special Requests: ADEQUATE

## 2017-05-31 MED FILL — METFORMIN HCL ER 500 MG TAB: 500 | 30 days supply | Qty: 120 | Fill #3

## 2017-06-02 DIAGNOSIS — N201 Calculus of ureter: Secondary | ICD-10-CM | POA: Diagnosis not present

## 2017-06-03 ENCOUNTER — Other Ambulatory Visit: Payer: Self-pay | Admitting: Urology

## 2017-06-05 DIAGNOSIS — G4710 Hypersomnia, unspecified: Secondary | ICD-10-CM | POA: Diagnosis not present

## 2017-06-07 DIAGNOSIS — G4710 Hypersomnia, unspecified: Secondary | ICD-10-CM | POA: Diagnosis not present

## 2017-06-07 DIAGNOSIS — Z8669 Personal history of other diseases of the nervous system and sense organs: Secondary | ICD-10-CM | POA: Diagnosis not present

## 2017-06-08 ENCOUNTER — Encounter (HOSPITAL_BASED_OUTPATIENT_CLINIC_OR_DEPARTMENT_OTHER): Payer: Self-pay | Admitting: Emergency Medicine

## 2017-06-08 ENCOUNTER — Other Ambulatory Visit: Payer: Self-pay

## 2017-06-08 NOTE — Progress Notes (Addendum)
SPOKE WITH: patient  RIDING HOME WITH: fiance Ronald  AM MEDICATIONS: cymbalta, prozac, pravastatin, lisinopril  NPO STATUS: npo solids after 12am; clear liquids until 0700; total npo after 0700 LABS: cbcdiff, hgba1c, cxr, EKG current and in epic  COMMENTS/CONCERNS: instructed to hold all DM medications day of surgery ; hold ASA 5 days before surgery  ; UPDATE 1151: consult done with anesthesia Dr. Hoy Morn for EKG 05-21-17 (in epic) showing prolonged QT interval. Per Dr. Gifford Shave, patient okay to proceed; "we''ll just make sure to avoid medications that may be problematic" .

## 2017-06-09 NOTE — Progress Notes (Signed)
RN called and spoke with patient and clarified with patient that she is NOT to take her lisinopril morning of surgery. Patient verbalized understanding .

## 2017-06-14 ENCOUNTER — Ambulatory Visit (HOSPITAL_BASED_OUTPATIENT_CLINIC_OR_DEPARTMENT_OTHER)
Admission: RE | Admit: 2017-06-14 | Discharge: 2017-06-14 | Disposition: A | Discharge status: Home / Self Care | Payer: BLUE CROSS/BLUE SHIELD | Source: Ambulatory Visit | Attending: Urology | Admitting: Urology

## 2017-06-14 ENCOUNTER — Ambulatory Visit (HOSPITAL_BASED_OUTPATIENT_CLINIC_OR_DEPARTMENT_OTHER): Payer: BLUE CROSS/BLUE SHIELD | Admitting: Anesthesiology

## 2017-06-14 ENCOUNTER — Encounter (HOSPITAL_BASED_OUTPATIENT_CLINIC_OR_DEPARTMENT_OTHER)
Admission: RE | Disposition: A | Discharge status: Home / Self Care | Payer: Self-pay | Source: Ambulatory Visit | Attending: Urology

## 2017-06-14 ENCOUNTER — Encounter (HOSPITAL_BASED_OUTPATIENT_CLINIC_OR_DEPARTMENT_OTHER): Payer: Self-pay

## 2017-06-14 DIAGNOSIS — Z79899 Other long term (current) drug therapy: Secondary | ICD-10-CM | POA: Insufficient documentation

## 2017-06-14 DIAGNOSIS — G4730 Sleep apnea, unspecified: Secondary | ICD-10-CM | POA: Insufficient documentation

## 2017-06-14 DIAGNOSIS — Z7984 Long term (current) use of oral hypoglycemic drugs: Secondary | ICD-10-CM | POA: Diagnosis not present

## 2017-06-14 DIAGNOSIS — E119 Type 2 diabetes mellitus without complications: Secondary | ICD-10-CM | POA: Diagnosis not present

## 2017-06-14 DIAGNOSIS — E1151 Type 2 diabetes mellitus with diabetic peripheral angiopathy without gangrene: Secondary | ICD-10-CM | POA: Insufficient documentation

## 2017-06-14 DIAGNOSIS — N200 Calculus of kidney: Secondary | ICD-10-CM

## 2017-06-14 DIAGNOSIS — N139 Obstructive and reflux uropathy, unspecified: Secondary | ICD-10-CM | POA: Diagnosis not present

## 2017-06-14 DIAGNOSIS — I10 Essential (primary) hypertension: Secondary | ICD-10-CM | POA: Insufficient documentation

## 2017-06-14 DIAGNOSIS — N201 Calculus of ureter: Secondary | ICD-10-CM | POA: Insufficient documentation

## 2017-06-14 DIAGNOSIS — Z711 Person with feared health complaint in whom no diagnosis is made: Secondary | ICD-10-CM | POA: Diagnosis not present

## 2017-06-14 HISTORY — DX: Iron deficiency: E61.1

## 2017-06-14 HISTORY — DX: Calculus of ureter: N20.1

## 2017-06-14 HISTORY — DX: Unspecified osteoarthritis, unspecified site: M19.90

## 2017-06-14 HISTORY — PX: CYSTOSCOPY/URETEROSCOPY/HOLMIUM LASER/STENT PLACEMENT: SHX6546

## 2017-06-14 LAB — POCT I-STAT, CHEM 8
BUN: 4 mg/dL — ABNORMAL LOW (ref 6–20)
Calcium, Ion: 1.33 mmol/L (ref 1.15–1.40)
Chloride: 102 mmol/L (ref 101–111)
Creatinine, Ser: 0.7 mg/dL (ref 0.44–1.00)
Glucose, Bld: 152 mg/dL — ABNORMAL HIGH (ref 65–99)
HCT: 42 % (ref 36.0–46.0)
Hemoglobin: 14.3 g/dL (ref 12.0–15.0)
Potassium: 4.1 mmol/L (ref 3.5–5.1)
Sodium: 143 mmol/L (ref 135–145)
TCO2: 28 mmol/L (ref 22–32)

## 2017-06-14 LAB — GLUCOSE, CAPILLARY: Glucose-Capillary: 143 mg/dL — ABNORMAL HIGH (ref 65–99)

## 2017-06-14 SURGERY — CYSTOSCOPY/URETEROSCOPY/HOLMIUM LASER/STENT PLACEMENT
Anesthesia: General | Site: Ureter | Laterality: Left

## 2017-06-14 MED ORDER — ACETAMINOPHEN 10 MG/ML IV SOLN
1000.0000 mg | Freq: Once | INTRAVENOUS | Status: AC
Start: 2017-06-14 — End: 2017-06-14
  Administered 2017-06-14: 1000 mg via INTRAVENOUS
  Filled 2017-06-14: qty 100

## 2017-06-14 MED ORDER — IOHEXOL 300 MG/ML  SOLN
INTRAMUSCULAR | Status: DC | PRN
  Administered 2017-06-14: 10 mL

## 2017-06-14 MED ORDER — PROPOFOL 10 MG/ML IV BOLUS
INTRAVENOUS | Status: AC
Start: 2017-06-14 — End: ?
  Filled 2017-06-14: qty 20

## 2017-06-14 MED ORDER — DEXAMETHASONE SODIUM PHOSPHATE 10 MG/ML IJ SOLN
INTRAMUSCULAR | Status: AC
  Filled 2017-06-14: qty 1

## 2017-06-14 MED ORDER — ONDANSETRON HCL 4 MG/2ML IJ SOLN
INTRAMUSCULAR | Status: DC | PRN
  Administered 2017-06-14: 4 mg via INTRAVENOUS

## 2017-06-14 MED ORDER — FENTANYL CITRATE (PF) 100 MCG/2ML IJ SOLN
INTRAMUSCULAR | Status: DC | PRN
  Administered 2017-06-14 (×3): 25 ug via INTRAVENOUS

## 2017-06-14 MED ORDER — HYDROCODONE-ACETAMINOPHEN 5-325 MG PO TABS: 1.0000 | ORAL_TABLET | ORAL | 0 refills | Status: AC | PRN

## 2017-06-14 MED ORDER — ACETAMINOPHEN 10 MG/ML IV SOLN
INTRAVENOUS | Status: AC
  Filled 2017-06-14: qty 100

## 2017-06-14 MED ORDER — LIDOCAINE 2% (20 MG/ML) 5 ML SYRINGE
INTRAMUSCULAR | Status: DC | PRN
  Administered 2017-06-14: 40 mg via INTRAVENOUS

## 2017-06-14 MED ORDER — FENTANYL CITRATE (PF) 100 MCG/2ML IJ SOLN
INTRAMUSCULAR | Status: AC
  Filled 2017-06-14: qty 2

## 2017-06-14 MED ORDER — DEXAMETHASONE SODIUM PHOSPHATE 10 MG/ML IJ SOLN
INTRAMUSCULAR | Status: DC | PRN
  Administered 2017-06-14: 10 mg via INTRAVENOUS

## 2017-06-14 MED ORDER — DEXTROSE 5 % IV SOLN
5.0000 mg/kg | Freq: Once | INTRAVENOUS | Status: DC
  Filled 2017-06-14: qty 8.5

## 2017-06-14 MED ORDER — HYDROCODONE-ACETAMINOPHEN 5-325 MG PO TABS
1.0000 | ORAL_TABLET | Freq: Once | ORAL | Status: AC
  Administered 2017-06-14: 1 via ORAL
  Filled 2017-06-14: qty 1

## 2017-06-14 MED ORDER — ONDANSETRON HCL 4 MG/2ML IJ SOLN
INTRAMUSCULAR | Status: AC
  Filled 2017-06-14: qty 2

## 2017-06-14 MED ORDER — GENTAMICIN SULFATE 40 MG/ML IJ SOLN
5.0000 mg/kg | Freq: Once | INTRAVENOUS | Status: AC
  Administered 2017-06-14: 280 mg via INTRAVENOUS
  Filled 2017-06-14 (×2): qty 7.25

## 2017-06-14 MED ORDER — CEPHALEXIN 500 MG PO CAPS: 500.0000 mg | ORAL_CAPSULE | Freq: Four times a day (QID) | ORAL | 0 refills | Status: DC

## 2017-06-14 MED ORDER — FENTANYL CITRATE (PF) 100 MCG/2ML IJ SOLN
25.0000 ug | INTRAMUSCULAR | Status: DC | PRN
  Filled 2017-06-14: qty 1

## 2017-06-14 MED ORDER — SODIUM CHLORIDE 0.9 % IV SOLN
2.0000 g | Freq: Once | INTRAVENOUS | Status: AC
  Administered 2017-06-14: 2 g via INTRAVENOUS
  Filled 2017-06-14 (×2): qty 2000

## 2017-06-14 MED ORDER — LACTATED RINGERS IV SOLN
INTRAVENOUS | Status: DC
  Administered 2017-06-14: 1000 mL via INTRAVENOUS
  Filled 2017-06-14: qty 1000

## 2017-06-14 MED ORDER — MIDAZOLAM HCL 2 MG/2ML IJ SOLN
INTRAMUSCULAR | Status: AC
  Filled 2017-06-14: qty 2

## 2017-06-14 MED ORDER — MIDAZOLAM HCL 5 MG/5ML IJ SOLN
INTRAMUSCULAR | Status: DC | PRN
  Administered 2017-06-14: 2 mg via INTRAVENOUS

## 2017-06-14 MED ORDER — PROPOFOL 10 MG/ML IV BOLUS
INTRAVENOUS | Status: DC | PRN
  Administered 2017-06-14: 50 mg via INTRAVENOUS
  Administered 2017-06-14: 150 mg via INTRAVENOUS

## 2017-06-14 MED ORDER — PROMETHAZINE HCL 25 MG/ML IJ SOLN
6.2500 mg | INTRAMUSCULAR | Status: DC | PRN
  Filled 2017-06-14: qty 1

## 2017-06-14 MED ORDER — SODIUM CHLORIDE 0.9 % IR SOLN
Status: DC | PRN
  Administered 2017-06-14: 4000 mL

## 2017-06-14 MED ORDER — HYDROCODONE-ACETAMINOPHEN 5-325 MG PO TABS
ORAL_TABLET | ORAL | Status: AC
  Filled 2017-06-14: qty 1

## 2017-06-14 MED ORDER — MEPERIDINE HCL 25 MG/ML IJ SOLN
6.2500 mg | INTRAMUSCULAR | Status: DC | PRN
  Filled 2017-06-14: qty 1

## 2017-06-14 MED FILL — CEPHALEXIN 500 MG CAPSULE: 500 | 1 days supply | Qty: 6 | Fill #0

## 2017-06-14 MED FILL — HYDROCODON-APAP 5-325: 5-325 | 3 days supply | Qty: 20 | Fill #0

## 2017-06-14 SURGICAL SUPPLY — 20 items
BAG DRAIN URO-CYSTO SKYTR STRL (DRAIN) ×2 IMPLANT
BAG DRN UROCATH (DRAIN) ×1
BASKET ZERO TIP NITINOL 2.4FR (BASKET) ×1 IMPLANT
BSKT STON RTRVL ZERO TP 2.4FR (BASKET)
CATH URET 5FR 28IN OPEN ENDED (CATHETERS) ×2 IMPLANT
CLOTH BEACON ORANGE TIMEOUT ST (SAFETY) ×2 IMPLANT
GLOVE BIO SURGEON STRL SZ7.5 (GLOVE) ×2 IMPLANT
GLOVE BIOGEL PI IND STRL 8.5 (GLOVE) IMPLANT
GLOVE BIOGEL PI INDICATOR 8.5 (GLOVE) ×1
GLOVE ECLIPSE 8.5 STRL (GLOVE) ×1 IMPLANT
GOWN STRL REUS W/ TWL XL LVL3 (GOWN DISPOSABLE) ×1 IMPLANT
GOWN STRL REUS W/TWL XL LVL3 (GOWN DISPOSABLE) ×2
GUIDEWIRE STR DUAL SENSOR (WIRE) ×2 IMPLANT
INFUSOR MANOMETER BAG 3000ML (MISCELLANEOUS) ×2 IMPLANT
IV NS IRRIG 3000ML ARTHROMATIC (IV SOLUTION) ×2 IMPLANT
KIT TURNOVER CYSTO (KITS) ×2 IMPLANT
MANIFOLD NEPTUNE II (INSTRUMENTS) ×2 IMPLANT
NS IRRIG 500ML POUR BTL (IV SOLUTION) ×2 IMPLANT
PACK CYSTO (CUSTOM PROCEDURE TRAY) ×2 IMPLANT
TUBE CONNECTING 12X1/4 (SUCTIONS) ×2 IMPLANT

## 2017-06-14 NOTE — Discharge Instructions (Signed)

## 2017-06-14 NOTE — Op Note (Signed)
Date of procedure: 06/14/17  Preoperative diagnosis:  1. Left ureteral stone  Postoperative diagnosis:  1. Passed left ureteral stone  Procedure: 1. Cystoscopy 2. Left ureteroscopy 3. Left retrograde pyelogram with interpretation 4. Left ureteral stent removal  Surgeon: Baruch Gouty, MD  Anesthesia: General  Complications: None  Intraoperative findings: The patient was noted to have a partially duplicated left collecting system.  Pan ureteroscopy of both ureters did not reveal the mid to distal known 3 mm left renal calculus suggesting the patient had passed it.    EBL: None  Specimens: None  Drains: None  Disposition: Stable to the postanesthesia care unit  Indication for procedure: The patient is a 52 y.o. female with history of sepsis secondary to mid to distal left 3 mm left ureteral calculus who presents today for definitive management after negative  urine culture.  After reviewing the management options for treatment, the patient elected to proceed with the above surgical procedure(s). We have discussed the potential benefits and risks of the procedure, side effects of the proposed treatment, the likelihood of the patient achieving the goals of the procedure, and any potential problems that might occur during the procedure or recuperation. Informed consent has been obtained.  Description of procedure: The patient was met in the preoperative area. All risks, benefits, and indications of the procedure were described in great detail. The patient consented to the procedure. Preoperative antibiotics were given. The patient was taken to the operative theater. General anesthesia was induced per the anesthesia service. The patient was then placed in the dorsal lithotomy position and prepped and draped in the usual sterile fashion. A preoperative timeout was called.   A 20 French 30 degree cystoscope was inserted into the patient's bladder per urethra atraumatically.  The left  ureteral stent was grasped with flexible graspers and brought to the level of the urethral meatus.  A sensor wire was advanced through the stent up to the level of the left renal pelvis under fluoroscopy.  The stent was removed.  A semirigid ureteroscope was then introduced in the patient's bladder and into the left ureteral orifice.  Pan ureteroscopy did reveal a partially duplicated collecting system in the proximal portion of the ureter.  Pan ureteroscopy of the entire ureter including both sides of the duplication revealed no stone.  This suggested that the stone had passed.  A retrograde pyelogram was obtained through the ureteroscope which showed no filling defects within the ureter.  We did show the partially duplicated left collecting system.  Pan ureteroscopy was then repeated to ensure that no stones were visualized which again was negative.  This point minimal trauma had occurred so the ureteroscope was withdrawn the patient's bladder was emptied.  The sensor wire was removed.  No stent was placed due to minimal manipulation.  The stone was considered to be passed.  Plan: Patient will follow-up in 1 month with a renal ultrasound to rule out iatrogenic hydronephrosis.  Baruch Gouty, M.D.

## 2017-06-14 NOTE — Anesthesia Procedure Notes (Signed)
Procedure Name: LMA Insertion Date/Time: 06/14/2017 2:02 PM Performed by: Verlin Grills, CRNA Pre-anesthesia Checklist: Patient identified, Emergency Drugs available, Suction available and Patient being monitored Patient Re-evaluated:Patient Re-evaluated prior to induction Oxygen Delivery Method: Circle system utilized Preoxygenation: Pre-oxygenation with 100% oxygen Induction Type: IV induction Ventilation: Mask ventilation without difficulty LMA: LMA inserted LMA Size: 4.0 Number of attempts: 1 Placement Confirmation: positive ETCO2 and breath sounds checked- equal and bilateral Dental Injury: Teeth and Oropharynx as per pre-operative assessment

## 2017-06-14 NOTE — Transfer of Care (Signed)
Immediate Anesthesia Transfer of Care Note  Patient: Jaidence Geisler  Procedure(s) Performed: CYSTOSCOPY/URETEROSCOPY/HOLMIUM LASER/STENT EXCHANGE (Left Ureter)  Patient Location: PACU  Anesthesia Type:General  Level of Consciousness: awake, drowsy and patient cooperative  Airway & Oxygen Therapy: Patient Spontanous Breathing and Patient connected to nasal cannula oxygen  Post-op Assessment: Report given to RN and Post -op Vital signs reviewed and stable  Post vital signs: Reviewed and stable  Last Vitals:  Vitals:   06/14/17 1431 06/14/17 1432  BP:  (P) 135/88  Pulse:  82  Resp:  14  Temp: (P) 36.8 C   SpO2:  98%    Last Pain:  Vitals:   06/14/17 1204  TempSrc:   PainSc: 7       Patients Stated Pain Goal: 8 (79/39/68 8648)  Complications: No apparent anesthesia complications

## 2017-06-14 NOTE — H&P (Signed)
CC: I have kidney stones.  HPI: [name] is a 52 year-old female established patient who is here for renal calculi.  The problem is on the left side. She is not currently having flank pain, back pain, groin pain, nausea, vomiting, fever or chills. She has not caught a stone in her urine strainer since her symptoms began.   She has had ureteral stent and ureteroscopy for treatment of her stones in the past.   The patient presents today for follow-up after having a emergent left ureteral stent placed in the setting of sepsis for a 3 mm mid left ureteral calculus. She had no other stone seen on her CT scan. Currently, she is feeling much better. She has no other complaints. She presents today to discuss definitive stone surgery ureteroscopy.   Patient reports that she is feeling much better at this time. No complaints. The stone is only mildly bothersome. She has had ureteroscopy in the past.     ALLERGIES: No Allergies    MEDICATIONS: B-12  Metformin Hcl 1,000 mg tablet Oral  Pravastatin Sodium 80 mg tablet Oral  Valsartan-Hydrochlorothiazide 320 mg-25 mg tablet Oral  Victoza 3-Pak 0.6 mg/0.1 ml (18 mg/3 ml) pen injector Subcutaneous  Vitamin D2     GU PSH: Cystoscopy Insert Stent, Left - 05/21/2017 Hysterectomy Unilat SO - 2015      PSH Notes: Cholecystectomy, Hysterectomy, Tubal Ligation   NON-GU PSH: Cholecystectomy (open) - 2015 Tubal Ligation - 2015    GU PMH: History of urolithiasis, History of renal calculi - 2015 Stress Incontinence, Female stress incontinence - 2015 Urinary Urgency, Urinary urgency - 2015    NON-GU PMH: Personal history of other diseases of the circulatory system, History of hypertension - 2015 Personal history of other diseases of the digestive system, History of esophageal reflux - 2015 Personal history of other diseases of the musculoskeletal system and connective tissue, History of arthritis - 2015 Personal history of other diseases of the  nervous system and sense organs, History of glaucoma - 2015, History of sleep apnea, - 2015 Personal history of other endocrine, nutritional and metabolic disease, History of diabetes mellitus - 2015, History of hypercholesterolemia, - 2015 Personal history of other mental and behavioral disorders, History of depression - 2015 Muscle weakness (generalized), Muscle weakness - 2015 Unspecified dyspareunia, Dyspareunia, female - 2015 Anxiety Depression Diabetes Type 2 Encounter for general adult medical examination without abnormal findings, Encounter for preventive health examination Hypercholesterolemia Hypertension Hyperthyroidism Sleep Apnea    FAMILY HISTORY: 2 daughters - Daughter 3 Son's - Son No pertinent family history - Runs In Family   SOCIAL HISTORY: Marital Status: Divorced Preferred Language: English Current Smoking Status: Patient has never smoked.   Tobacco Use Assessment Completed: Used Tobacco in last 30 days? Has never drank.  Does not drink caffeine. 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     Notes: Never a smoker, No caffeine use, Social alcohol use, Occupation, Number of children, Divorced, Exercise habits, Activities of daily living (ADL's), independent   REVIEW OF SYSTEMS:    GU Review Female:   Patient reports frequent urination, burning /pain with urination, get up at night to urinate, and leakage of urine. Patient denies hard to postpone urination, stream starts and stops, trouble starting your stream, have to strain to urinate, and being pregnant.  Gastrointestinal (Upper):   Patient denies nausea, vomiting, and indigestion/ heartburn.  Gastrointestinal (Lower):   Patient denies diarrhea and constipation.  Constitutional:   Patient reports night sweats, weight loss, and  fatigue. Patient denies fever.  Skin:   Patient denies skin rash/ lesion and itching.  Eyes:   Patient reports blurred vision. Patient denies double vision.  Ears/ Nose/ Throat:   Patient  denies sore throat and sinus problems.  Hematologic/Lymphatic:   Patient denies swollen glands and easy bruising.  Cardiovascular:   Patient denies leg swelling and chest pains.  Respiratory:   Patient denies cough and shortness of breath.  Endocrine:   Patient denies excessive thirst.  Musculoskeletal:   Patient reports back pain. Patient denies joint pain.  Neurological:   Patient denies headaches and dizziness.  Psychologic:   Patient reports depression and anxiety.    Notes: Blood in urine... UTI... Painful intercourse    VITAL SIGNS:      06/02/2017 11:15 AM  Weight 150 lb / 68.04 kg  Height 63 in / 160.02 cm  BP 127/85 mmHg  Pulse 102 /min  Temperature 98.0 F / 36.6 C  BMI 26.6 kg/m   GU PHYSICAL EXAMINATION:    Cervix: S/P Hysterectomy  Uterus: S/P Hysterectomy   MULTI-SYSTEM PHYSICAL EXAMINATION:    Constitutional: Well-nourished. No physical deformities. Normally developed. Good grooming.  Neck: Neck symmetrical, not swollen. Normal tracheal position.  Respiratory: No labored breathing, no use of accessory muscles. Normal breath sounds.  Cardiovascular: Regular rate and rhythm. No murmur, no gallop. Normal temperature, normal extremity pulses, no swelling, no varicosities.  Lymphatic: No enlargement of neck, axillae, groin.  Skin: No paleness, no jaundice, no cyanosis. No lesion, no ulcer, no rash.  Neurologic / Psychiatric: Oriented to time, oriented to place, oriented to person. No depression, no anxiety, no agitation.  Gastrointestinal: No mass, no tenderness, no rigidity, non obese abdomen.  Eyes: Normal conjunctivae. Normal eyelids.  Ears, Nose, Mouth, and Throat: Left ear no scars, no lesions, no masses. Right ear no scars, no lesions, no masses. Nose no scars, no lesions, no masses. Normal hearing. Normal lips.  Musculoskeletal: Normal gait and station of head and neck.     PAST DATA REVIEWED:  Source Of History:  Patient   PROCEDURES:          Urinalysis  w/Scope Dipstick Dipstick Cont'd Micro  Color: Yellow Bilirubin: Neg WBC/hpf: 0 - 5/hpf  Appearance: Cloudy Ketones: Neg RBC/hpf: 40 - 60/hpf  Specific Gravity: 1.015 Blood: 3+ Bacteria: NS (Not Seen)  pH: 7.5 Protein: Trace Cystals: NS (Not Seen)  Glucose: 3+ Urobilinogen: 0.2 Casts: NS (Not Seen)    Nitrites: Neg Trichomonas: Not Present    Leukocyte Esterase: Neg Mucous: Not Present      Epithelial Cells: 0 - 5/hpf      Yeast: NS (Not Seen)      Sperm: Not Present    ASSESSMENT:      ICD-10 Details  1 GU:   Ureteral calculus - N20.1    PLAN:           Orders Labs Urine Culture          Document Letter(s):  Created for Marilynne Drivers, PA   Created for Patient: Clinical Summary    The risks, benefits, and some of the possible complications of surgical treatments including cystoscopy, ureteroscopy, renoscopy, laser lithotripsy, extracorporeal shock wave lithotripsy, stent insertion and others were discussed with the patient. All questions were answered.  The patient gave fully informed consent to proceed with a ureteroscopy with or without laser lithotripsy with stone extraction for the treatment of their kidney stones.   The patient was given instructions to call  for abdominal pain, pelvic pain, perirectal pain, nausea, vomiting, diarrhea, fever over 100 degrees F, chills, hematuria, dysuria, frequency, urgency, or urge incontinence.         Notes:   1. Left ureteral stone  Plan for definitive stone management with cysto, left URS, laser litho, left stent exchange

## 2017-06-14 NOTE — Anesthesia Preprocedure Evaluation (Addendum)
Anesthesia Evaluation  Patient identified by MRN, date of birth, ID band Patient awake    Reviewed: Allergy & Precautions, H&P , NPO status , Patient's Chart, lab work & pertinent test results  History of Anesthesia Complications Negative for: history of anesthetic complications  Airway Mallampati: II  TM Distance: >3 FB Neck ROM: Full    Dental  (+) Dental Advisory Given   Pulmonary sleep apnea and Continuous Positive Airway Pressure Ventilation ,    Pulmonary exam normal breath sounds clear to auscultation       Cardiovascular hypertension, Pt. on medications (-) angina+ Peripheral Vascular Disease   Rhythm:Regular Rate:Normal     Neuro/Psych  Headaches, Depression    GI/Hepatic negative GI ROS, Neg liver ROS,   Endo/Other  diabetes, Oral Hypoglycemic AgentsHyperthyroidism   Renal/GU Renal disease     Musculoskeletal   Abdominal   Peds  Hematology  (+) Blood dyscrasia (Hb 11.0), anemia ,   Anesthesia Other Findings   Reproductive/Obstetrics                             Anesthesia Physical  Anesthesia Plan  ASA: III  Anesthesia Plan: General   Post-op Pain Management:    Induction: Intravenous  PONV Risk Score and Plan: 4 or greater and Ondansetron, Dexamethasone, Midazolam and Treatment may vary due to age or medical condition  Airway Management Planned: LMA  Additional Equipment:   Intra-op Plan:   Post-operative Plan: Extubation in OR  Informed Consent: I have reviewed the patients History and Physical, chart, labs and discussed the procedure including the risks, benefits and alternatives for the proposed anesthesia with the patient or authorized representative who has indicated his/her understanding and acceptance.   Dental advisory given  Plan Discussed with: CRNA  Anesthesia Plan Comments:        Anesthesia Quick Evaluation

## 2017-06-15 ENCOUNTER — Encounter (HOSPITAL_BASED_OUTPATIENT_CLINIC_OR_DEPARTMENT_OTHER): Payer: Self-pay | Admitting: Urology

## 2017-06-15 MED FILL — UNIFINE PENTIPS 31GX3/16": 31G X 5 MM | 90 days supply | Qty: 100 | Fill #1

## 2017-06-15 MED FILL — PRAVASTATIN NA 40 MG TAB: 40 | 90 days supply | Qty: 90 | Fill #2

## 2017-06-15 MED FILL — VICTOZA 18 MG/3 ML INJECT P: 18 | 90 days supply | Qty: 27 | Fill #1

## 2017-06-15 MED FILL — UNIFINE PENTIPS 31GX3/16: 31G X 5 MM | 90 days supply | Qty: 100 | Fill #1

## 2017-06-15 MED FILL — LISINOPRIL 10 MG TABS: 10 | 30 days supply | Qty: 30 | Fill #4

## 2017-06-15 MED FILL — INVOKANA 100 MG TABLET: 100 | 30 days supply | Qty: 30 | Fill #1

## 2017-06-15 NOTE — Anesthesia Postprocedure Evaluation (Signed)
Anesthesia Post Note  Patient: [name]  Procedure(s) Performed: CYSTOSCOPY/URETEROSCOPY/STENT REMOVAL (Left Ureter)     Patient location during evaluation: PACU Anesthesia Type: General Level of consciousness: sedated and patient cooperative Pain management: pain level controlled Vital Signs Assessment: post-procedure vital signs reviewed and stable Respiratory status: spontaneous breathing Cardiovascular status: stable Anesthetic complications: no    Last Vitals:  Vitals:   06/14/17 1524 06/14/17 1611  BP: (!) 160/91 (!) 144/73  Pulse: 74 82  Resp: 11 16  Temp:  36.8 C  SpO2: 98% 100%    Last Pain:  Vitals:   06/15/17 1438  TempSrc:   PainSc: [place]

## 2017-06-17 ENCOUNTER — Other Ambulatory Visit (HOSPITAL_BASED_OUTPATIENT_CLINIC_OR_DEPARTMENT_OTHER): Payer: Self-pay

## 2017-06-17 DIAGNOSIS — G4710 Hypersomnia, unspecified: Secondary | ICD-10-CM

## 2017-06-17 DIAGNOSIS — G4730 Sleep apnea, unspecified: Secondary | ICD-10-CM

## 2017-06-17 DIAGNOSIS — R0683 Snoring: Secondary | ICD-10-CM

## 2017-06-25 MED FILL — FLUoxetine HCL 40 MG CAPS: 40 | 30 days supply | Qty: 30 | Fill #4

## 2017-06-25 MED FILL — DULoxetine HCL 60 MG CPEP: 60 | 30 days supply | Qty: 30 | Fill #1

## 2017-06-29 ENCOUNTER — Ambulatory Visit (HOSPITAL_BASED_OUTPATIENT_CLINIC_OR_DEPARTMENT_OTHER): Payer: BLUE CROSS/BLUE SHIELD | Attending: Internal Medicine | Admitting: Internal Medicine

## 2017-06-29 VITALS — Ht 63.0 in | Wt 150.0 lb

## 2017-06-29 DIAGNOSIS — G4710 Hypersomnia, unspecified: Secondary | ICD-10-CM | POA: Insufficient documentation

## 2017-06-29 DIAGNOSIS — R0683 Snoring: Secondary | ICD-10-CM | POA: Diagnosis not present

## 2017-06-29 DIAGNOSIS — G4730 Sleep apnea, unspecified: Secondary | ICD-10-CM | POA: Insufficient documentation

## 2017-06-29 DIAGNOSIS — G4733 Obstructive sleep apnea (adult) (pediatric): Secondary | ICD-10-CM

## 2017-07-04 DIAGNOSIS — G4710 Hypersomnia, unspecified: Secondary | ICD-10-CM | POA: Diagnosis not present

## 2017-07-04 NOTE — Procedures (Signed)
   NAME: [name] DATE OF BIRTH:  [birth year] MEDICAL RECORD NUMBER [id number]  LOCATION: [facility]  PHYSICIAN: Marius Ditch  DATE OF STUDY: 06/29/2017  SLEEP STUDY TYPE: Nocturnal Polysomnogram               REFERRING PHYSICIAN: Marius Ditch, MD  INDICATION FOR STUDY: unexpectedly negative HSAT in patient with a h/o mild OSA. Her prior PSG was on 05/24/02. At that time she weighed 184 pounds and had a BMI of 31.6. Her AHI was 12.4/hr  EPWORTH SLEEPINESS SCORE:  15 HEIGHT: 5\' 3"  (160 cm)  WEIGHT: 150 lb (68 kg)    Body mass index is 26.57 kg/m.  NECK SIZE: 15.5 in.  MEDICATIONS  Patient self administered medications include: N/A. Medications administered during study include No sleep medicine administered.Marland Kitchen   SLEEP STUDY TECHNIQUE  A multi-channel overnight Polysomnography study was performed. The channels recorded and monitored were central and occipital EEG, electrooculogram (EOG), submentalis EMG (chin), nasal and oral airflow, thoracic and abdominal wall motion, anterior tibialis EMG, snore microphone, electrocardiogram, and a pulse oximetry.   TECHNICAL COMMENTS  Comments added by Technician: NONE Comments added by Scorer: N/A   SLEEP ARCHITECTURE  The study was initiated at 10:20:52 PM and terminated at 4:38:23 AM. The total recorded time was 377.5 minutes. EEG confirmed total sleep time was 301.5 minutes yielding a sleep efficiency of 79.9%%. Sleep onset after lights out was 14.5 minutes with a REM latency of 98.5 minutes. The patient spent 5.0%% of the night in stage N1 sleep, 70.8%% in stage N2 sleep, 0.0%% in stage N3 and 24.21% in REM. Wake after sleep onset (WASO) was 61.5 minutes.  The Arousal Index was 11.5/hour.   RESPIRATORY PARAMETERS  There were a total of 9 respiratory disturbances out of which 7 were apneas ( 5 obstructive, 0 mixed, 2 central) and 2 hypopneas. The apnea/hypopnea index (AHI) was 1.8 events/hour. The central sleep  apnea index was 0.4 events/hour. The REM AHI was 3.3 events/hour and NREM AHI was 1.3 events/hour. The supine AHI was 1.5 events/hour and the non supine AHI was 2.00 supine during 40.30% of sleep. Her RDI was 2.8/hr. Respiratory disturbances were associated with oxygen desaturation down to a nadir of 93.0% during sleep. The mean oxygen saturation during the study was 97.4%. The cumulative time under 88% oxygen saturation was 5.5 minutes.  LEG MOVEMENT DATA  The total leg movements were 16 with a resulting leg movement index of 3.2/hr . Associated arousal with leg movement index was 0.6/hr.   CARDIAC DATA  The underlying cardiac rhythm was most consistent with sinus rhythm. Mean heart rate during sleep was 70.1 bpm. Additional rhythm abnormalities include None.   IMPRESSIONS  - No Significant Obstructive Sleep apnea(OSA). Sleep apnea seems to have largely resolved after weight loss of about 18% of total body weight.  - No significant periodic leg movements(PLMs) during sleep.   DIAGNOSIS  - Normal PSG RECOMMENDATIONS  - There is no indication for intervention for sleep disordered breathing.   Marius Ditch Sleep specialist, [place] Board of Internal Medicine  ELECTRONICALLY SIGNED ON:  07/04/2017, 9:17 PM [place] PH: [phone]   FX: [phone] [place]

## 2017-07-27 DIAGNOSIS — N201 Calculus of ureter: Secondary | ICD-10-CM | POA: Diagnosis not present

## 2017-07-28 DIAGNOSIS — H2513 Age-related nuclear cataract, bilateral: Secondary | ICD-10-CM | POA: Diagnosis not present

## 2017-07-28 DIAGNOSIS — E119 Type 2 diabetes mellitus without complications: Secondary | ICD-10-CM | POA: Diagnosis not present

## 2017-08-04 DIAGNOSIS — E1169 Type 2 diabetes mellitus with other specified complication: Secondary | ICD-10-CM | POA: Diagnosis not present

## 2017-08-04 DIAGNOSIS — E010 Iodine-deficiency related diffuse (endemic) goiter: Secondary | ICD-10-CM | POA: Diagnosis not present

## 2017-08-04 DIAGNOSIS — I10 Essential (primary) hypertension: Secondary | ICD-10-CM | POA: Diagnosis not present

## 2017-08-04 DIAGNOSIS — E785 Hyperlipidemia, unspecified: Secondary | ICD-10-CM | POA: Diagnosis not present

## 2017-08-04 DIAGNOSIS — F321 Major depressive disorder, single episode, moderate: Secondary | ICD-10-CM | POA: Diagnosis not present

## 2017-08-04 MED FILL — TOPIRAMATE 25 MG TABLET: 25 | 90 days supply | Qty: 180 | Fill #0

## 2017-08-04 MED FILL — LISINOPRIL 10 MG TABS: 10 | 90 days supply | Qty: 90 | Fill #0

## 2017-08-04 MED FILL — buPROPion HCL ER (XL) 150 M: 150 | 90 days supply | Qty: 90 | Fill #0

## 2017-08-04 MED FILL — INVOKANA 100 MG TABLET: 100 | 90 days supply | Qty: 90 | Fill #0

## 2017-08-04 MED FILL — DULoxetine HCL 60 MG CPEP: 60 | 90 days supply | Qty: 90 | Fill #0

## 2017-08-04 MED FILL — metFORMIN HCL 500 MG TABS: 500 | 90 days supply | Qty: 180 | Fill #0

## 2017-08-19 MED FILL — MICROLET LANCETS: 90 days supply | Qty: 100 | Fill #0

## 2017-08-19 MED FILL — CONTOUR NEXT STRIPS: 90 days supply | Qty: 100 | Fill #0

## 2017-11-05 DIAGNOSIS — E559 Vitamin D deficiency, unspecified: Secondary | ICD-10-CM | POA: Diagnosis not present

## 2017-11-05 DIAGNOSIS — E785 Hyperlipidemia, unspecified: Secondary | ICD-10-CM | POA: Diagnosis not present

## 2017-11-05 DIAGNOSIS — I10 Essential (primary) hypertension: Secondary | ICD-10-CM | POA: Diagnosis not present

## 2017-11-05 DIAGNOSIS — F321 Major depressive disorder, single episode, moderate: Secondary | ICD-10-CM | POA: Diagnosis not present

## 2017-11-05 DIAGNOSIS — E1169 Type 2 diabetes mellitus with other specified complication: Secondary | ICD-10-CM | POA: Diagnosis not present

## 2017-11-05 MED FILL — PRAVASTATIN SODIUM 80 MG TA: 80 | 90 days supply | Qty: 90 | Fill #0

## 2017-11-22 MED FILL — ROSUVASTATIN CALCIUM 40 MG: 40 | 30 days supply | Qty: 30 | Fill #0

## 2018-01-04 DIAGNOSIS — R109 Unspecified abdominal pain: Secondary | ICD-10-CM | POA: Diagnosis not present

## 2018-01-04 DIAGNOSIS — R42 Dizziness and giddiness: Secondary | ICD-10-CM | POA: Diagnosis not present

## 2018-01-04 DIAGNOSIS — N3000 Acute cystitis without hematuria: Secondary | ICD-10-CM | POA: Diagnosis not present

## 2018-01-04 DIAGNOSIS — E1169 Type 2 diabetes mellitus with other specified complication: Secondary | ICD-10-CM | POA: Diagnosis not present

## 2018-01-04 MED FILL — ONDANSETRON HCL 8 MG TABLET: 8 | 7 days supply | Qty: 20 | Fill #0

## 2018-01-04 MED FILL — SULFAMETHOXAZOLE-TMP DS TAB: 800-160 | 10 days supply | Qty: 20 | Fill #0

## 2018-01-04 MED FILL — metFORMIN HCL 500 MG TABS: 500 | 90 days supply | Qty: 180 | Fill #1

## 2018-01-26 MED FILL — VICTOZA 18 MG/3 ML INJECT P: 18 | 90 days supply | Qty: 27 | Fill #2

## 2018-01-27 MED FILL — ROSUVASTATIN CALCIUM 40 MG: 40 | 90 days supply | Qty: 90 | Fill #0

## 2018-01-27 MED FILL — LISINOPRIL 10 MG TABS: 10 | 90 days supply | Qty: 90 | Fill #0

## 2018-02-08 DIAGNOSIS — Z113 Encounter for screening for infections with a predominantly sexual mode of transmission: Secondary | ICD-10-CM | POA: Diagnosis not present

## 2018-04-12 DIAGNOSIS — K219 Gastro-esophageal reflux disease without esophagitis: Secondary | ICD-10-CM | POA: Diagnosis not present

## 2018-04-12 DIAGNOSIS — R110 Nausea: Secondary | ICD-10-CM | POA: Diagnosis not present

## 2018-04-12 DIAGNOSIS — E049 Nontoxic goiter, unspecified: Secondary | ICD-10-CM | POA: Diagnosis not present

## 2018-05-11 DIAGNOSIS — R6889 Other general symptoms and signs: Secondary | ICD-10-CM | POA: Diagnosis not present

## 2018-05-11 DIAGNOSIS — E559 Vitamin D deficiency, unspecified: Secondary | ICD-10-CM | POA: Diagnosis not present

## 2018-05-11 DIAGNOSIS — Z206 Contact with and (suspected) exposure to human immunodeficiency virus [HIV]: Secondary | ICD-10-CM | POA: Diagnosis not present

## 2018-05-11 DIAGNOSIS — E785 Hyperlipidemia, unspecified: Secondary | ICD-10-CM | POA: Diagnosis not present

## 2018-05-11 DIAGNOSIS — I10 Essential (primary) hypertension: Secondary | ICD-10-CM | POA: Diagnosis not present

## 2018-05-11 DIAGNOSIS — J029 Acute pharyngitis, unspecified: Secondary | ICD-10-CM | POA: Diagnosis not present

## 2018-05-11 DIAGNOSIS — E1169 Type 2 diabetes mellitus with other specified complication: Secondary | ICD-10-CM | POA: Diagnosis not present

## 2018-05-11 MED FILL — OZEMPIC 0.25 OR 0.5 MG/DOSE: 2 | 42 days supply | Qty: 2 | Fill #0

## 2018-05-11 MED FILL — INVOKANA 300 MG TABLET: 300 | 90 days supply | Qty: 90 | Fill #0

## 2018-05-11 MED FILL — ROSUVASTATIN CALCIUM 40 MG: 40 | 90 days supply | Qty: 90 | Fill #0

## 2018-05-11 MED FILL — LISINOPRIL 10 MG TABLET: 10 | 90 days supply | Qty: 90 | Fill #0

## 2018-05-11 MED FILL — metFORMIN HCL 500 MG TABS: 500 | 90 days supply | Qty: 180 | Fill #0

## 2018-05-11 MED FILL — UNIFINE PENTIPS 32GX5/32: 32G X 4 MM | 90 days supply | Qty: 100 | Fill #0

## 2018-05-11 MED FILL — UNIFINE PENTIPS 32GX5/32": 32G X 4 MM | 90 days supply | Qty: 100 | Fill #0

## 2018-05-21 LAB — GLUCOSE, POCT (MANUAL RESULT ENTRY): POC Glucose: 111 mg/dl — AB (ref 70–99)

## 2018-05-27 DIAGNOSIS — Z1231 Encounter for screening mammogram for malignant neoplasm of breast: Secondary | ICD-10-CM | POA: Diagnosis not present

## 2018-05-27 DIAGNOSIS — Z803 Family history of malignant neoplasm of breast: Secondary | ICD-10-CM | POA: Diagnosis not present

## 2018-06-06 DIAGNOSIS — G4710 Hypersomnia, unspecified: Secondary | ICD-10-CM | POA: Diagnosis not present

## 2018-07-13 DIAGNOSIS — E042 Nontoxic multinodular goiter: Secondary | ICD-10-CM | POA: Diagnosis not present

## 2018-07-13 DIAGNOSIS — E1169 Type 2 diabetes mellitus with other specified complication: Secondary | ICD-10-CM | POA: Diagnosis not present

## 2018-07-13 DIAGNOSIS — E785 Hyperlipidemia, unspecified: Secondary | ICD-10-CM | POA: Diagnosis not present

## 2018-07-13 DIAGNOSIS — I10 Essential (primary) hypertension: Secondary | ICD-10-CM | POA: Diagnosis not present

## 2018-07-13 MED FILL — OZEMPIC 0.25 OR 0.5 MG/DOSE: 2 | 28 days supply | Qty: 2 | Fill #0

## 2018-07-13 MED FILL — DULoxetine HCL 30 MG CPEP: 30 | 90 days supply | Qty: 180 | Fill #0

## 2018-08-08 DIAGNOSIS — E119 Type 2 diabetes mellitus without complications: Secondary | ICD-10-CM | POA: Diagnosis not present

## 2018-08-08 DIAGNOSIS — H2513 Age-related nuclear cataract, bilateral: Secondary | ICD-10-CM | POA: Diagnosis not present

## 2018-08-08 DIAGNOSIS — H1045 Other chronic allergic conjunctivitis: Secondary | ICD-10-CM | POA: Diagnosis not present

## 2018-08-31 MED FILL — LISINOPRIL 10 MG TABLET: 10 | 90 days supply | Qty: 90 | Fill #1

## 2018-08-31 MED FILL — ROSUVASTATIN CALCIUM 40 MG: 40 | 90 days supply | Qty: 90 | Fill #1

## 2018-08-31 MED FILL — metFORMIN HCL 500 MG TABS: 500 | 90 days supply | Qty: 180 | Fill #1

## 2018-09-01 MED FILL — INVOKANA 300 MG TABLET: 300 | 90 days supply | Qty: 90 | Fill #1

## 2018-09-01 MED FILL — OZEMPIC 0.25 OR 0.5 MG/DOSE: 2 | 28 days supply | Qty: 2 | Fill #1

## 2018-10-26 DIAGNOSIS — E785 Hyperlipidemia, unspecified: Secondary | ICD-10-CM | POA: Diagnosis not present

## 2018-10-26 DIAGNOSIS — I10 Essential (primary) hypertension: Secondary | ICD-10-CM | POA: Diagnosis not present

## 2018-10-26 DIAGNOSIS — E1169 Type 2 diabetes mellitus with other specified complication: Secondary | ICD-10-CM | POA: Diagnosis not present

## 2018-10-26 DIAGNOSIS — E559 Vitamin D deficiency, unspecified: Secondary | ICD-10-CM | POA: Diagnosis not present

## 2018-10-26 MED FILL — DULoxetine HCL 30 MG CPEP: 30 | 90 days supply | Qty: 180 | Fill #0

## 2018-10-26 MED FILL — ROSUVASTATIN CALCIUM 20 MG: 20 | 90 days supply | Qty: 90 | Fill #0

## 2018-10-26 MED FILL — OZEMPIC 0.25 OR 0.5 MG/DOSE: 2 | 84 days supply | Qty: 5 | Fill #0

## 2018-10-26 MED FILL — SUMATRIPTAN SUCC 100 MG TAB: 100 | 30 days supply | Qty: 9 | Fill #0

## 2018-10-26 MED FILL — TOPIRAMATE 50 MG TABLET: 50 | 30 days supply | Qty: 30 | Fill #0

## 2018-10-26 MED FILL — MELOXICAM 15 MG TABLET: 15 | 30 days supply | Qty: 30 | Fill #0

## 2018-11-11 DIAGNOSIS — E1169 Type 2 diabetes mellitus with other specified complication: Secondary | ICD-10-CM | POA: Diagnosis not present

## 2018-11-11 DIAGNOSIS — E559 Vitamin D deficiency, unspecified: Secondary | ICD-10-CM | POA: Diagnosis not present

## 2018-11-11 DIAGNOSIS — E785 Hyperlipidemia, unspecified: Secondary | ICD-10-CM | POA: Diagnosis not present

## 2018-11-22 MED FILL — MICROLET LANCETS MISC: 90 days supply | Qty: 100 | Fill #0

## 2018-11-22 MED FILL — OZEMPIC 1 MG/DOSE SOPN: 2 | 28 days supply | Qty: 3 | Fill #0

## 2018-11-22 MED FILL — CONTOUR NEXT STRIPS: 90 days supply | Qty: 100 | Fill #0

## 2018-11-23 DIAGNOSIS — R630 Anorexia: Secondary | ICD-10-CM | POA: Diagnosis not present

## 2018-11-23 DIAGNOSIS — E119 Type 2 diabetes mellitus without complications: Secondary | ICD-10-CM | POA: Diagnosis not present

## 2018-12-16 MED FILL — INVOKANA 300 MG TABLET: 300 | 90 days supply | Qty: 90 | Fill #0

## 2018-12-16 MED FILL — TOPIRAMATE 50 MG TABLET: 50 | 30 days supply | Qty: 30 | Fill #1

## 2019-01-05 MED FILL — LISINOPRIL 10 MG TABS: 10 | 90 days supply | Qty: 90 | Fill #0

## 2019-01-06 DIAGNOSIS — R21 Rash and other nonspecific skin eruption: Secondary | ICD-10-CM | POA: Diagnosis not present

## 2019-01-06 MED FILL — MELOXICAM 15 MG TABLET: 15 | 30 days supply | Qty: 30 | Fill #0

## 2019-01-09 MED FILL — TOPIRAMATE 50 MG TABLET: 50 | 30 days supply | Qty: 30 | Fill #0

## 2019-02-03 DIAGNOSIS — E559 Vitamin D deficiency, unspecified: Secondary | ICD-10-CM | POA: Diagnosis not present

## 2019-02-03 DIAGNOSIS — E119 Type 2 diabetes mellitus without complications: Secondary | ICD-10-CM | POA: Diagnosis not present

## 2019-02-03 DIAGNOSIS — E785 Hyperlipidemia, unspecified: Secondary | ICD-10-CM | POA: Diagnosis not present

## 2019-02-06 MED FILL — metFORMIN HCL 500 MG TABS: 500 | 90 days supply | Qty: 180 | Fill #0

## 2019-02-06 MED FILL — TOPIRAMATE 50 MG TABLET: 50 | 30 days supply | Qty: 30 | Fill #0

## 2019-02-07 MED FILL — MELOXICAM 15 MG TABLET: 15 | 30 days supply | Qty: 30 | Fill #0

## 2019-02-09 DIAGNOSIS — R110 Nausea: Secondary | ICD-10-CM | POA: Diagnosis not present

## 2019-02-09 DIAGNOSIS — R5383 Other fatigue: Secondary | ICD-10-CM | POA: Diagnosis not present

## 2019-02-09 DIAGNOSIS — R55 Syncope and collapse: Secondary | ICD-10-CM | POA: Diagnosis not present

## 2019-02-10 DIAGNOSIS — E119 Type 2 diabetes mellitus without complications: Secondary | ICD-10-CM | POA: Diagnosis not present

## 2019-02-10 DIAGNOSIS — I10 Essential (primary) hypertension: Secondary | ICD-10-CM | POA: Diagnosis not present

## 2019-02-10 DIAGNOSIS — R5383 Other fatigue: Secondary | ICD-10-CM | POA: Diagnosis not present

## 2019-02-10 DIAGNOSIS — E785 Hyperlipidemia, unspecified: Secondary | ICD-10-CM | POA: Diagnosis not present

## 2019-02-10 DIAGNOSIS — R809 Proteinuria, unspecified: Secondary | ICD-10-CM | POA: Diagnosis not present

## 2019-02-10 DIAGNOSIS — R55 Syncope and collapse: Secondary | ICD-10-CM | POA: Diagnosis not present

## 2019-02-10 MED FILL — GLIMEPIRIDE 2 MG TABLET: 2 | 90 days supply | Qty: 90 | Fill #0

## 2019-03-06 MED FILL — ROSUVASTATIN CALCIUM 20 MG: 20 | 90 days supply | Qty: 90 | Fill #1

## 2019-03-10 MED FILL — TOPIRAMATE 50 MG TABLET: 50 | 30 days supply | Qty: 30 | Fill #1

## 2019-03-10 MED FILL — MELOXICAM 15 MG TABLET: 15 | 30 days supply | Qty: 30 | Fill #0

## 2019-03-28 MED FILL — INVOKANA 300 MG TABLET: 300 | 90 days supply | Qty: 90 | Fill #1

## 2019-04-11 IMAGING — DX DG CHEST 1V PORT
1 series · 1 of 1 positions shown · non-contrast
Comparison: None.

CLINICAL DATA: Fever

EXAM:
PORTABLE CHEST 1 VIEW

[chest ap]
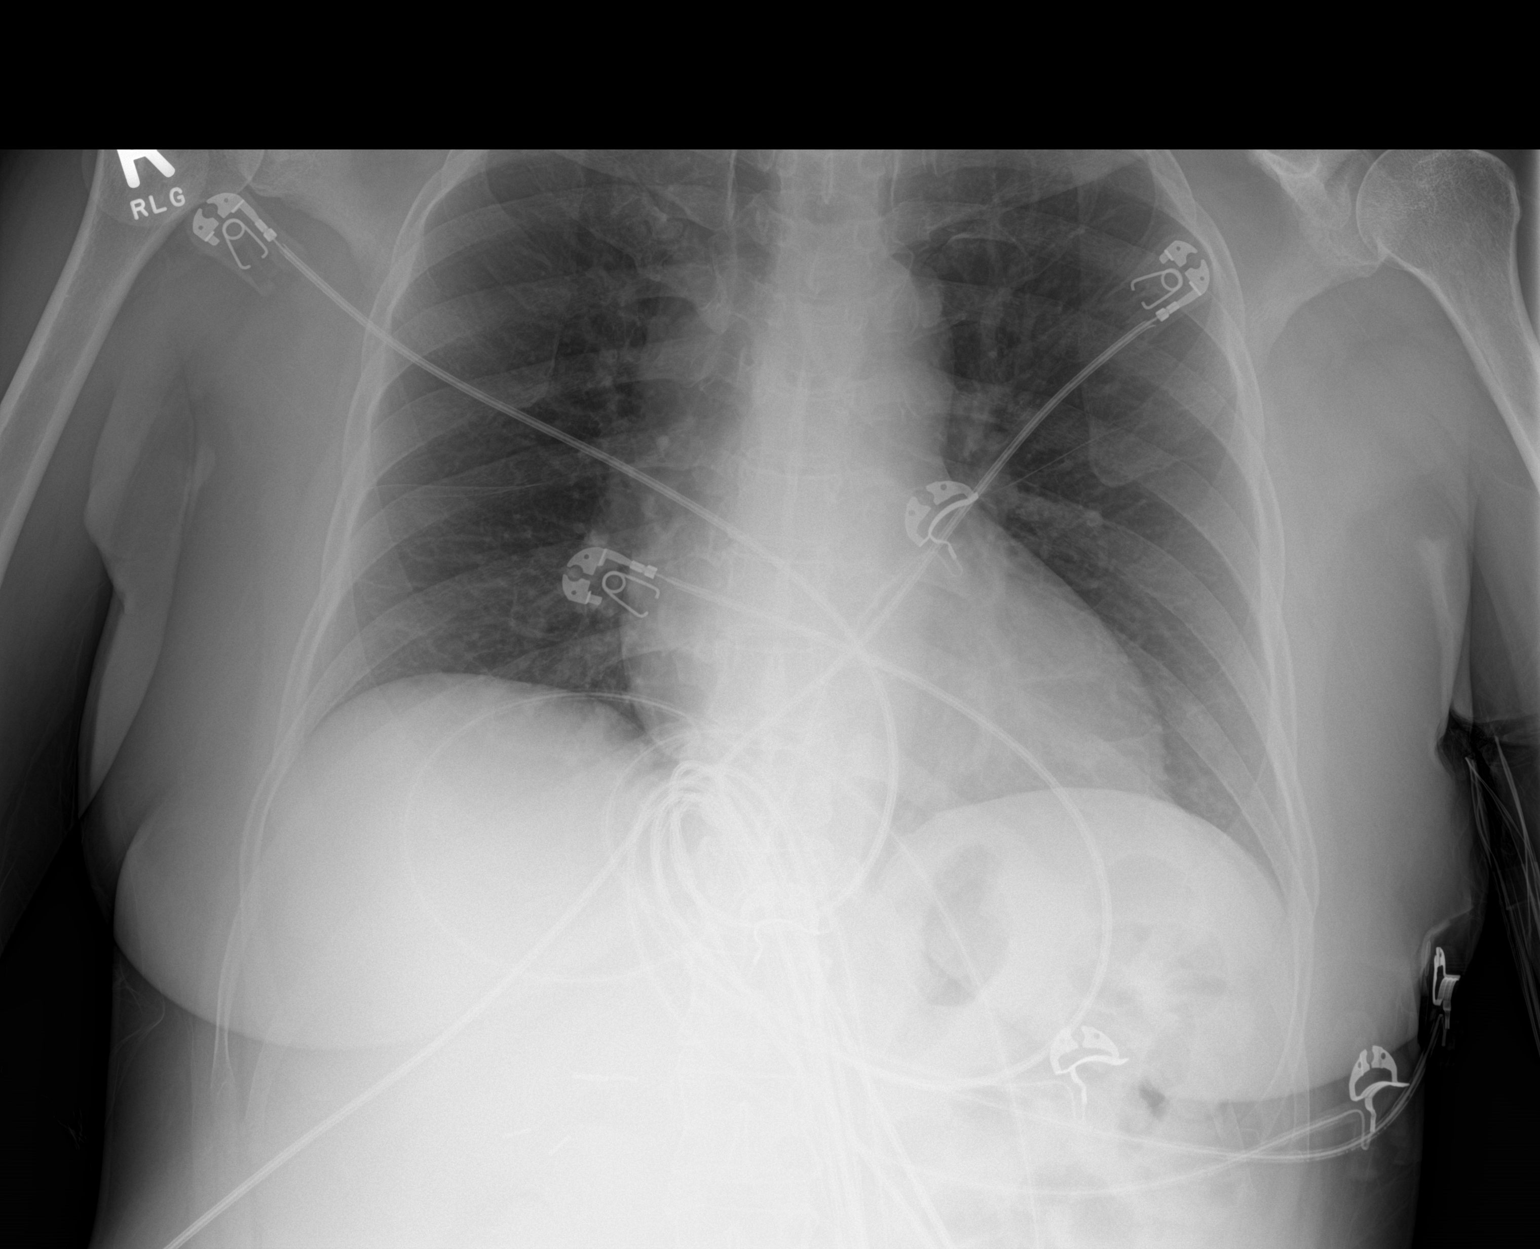

[1 of 1 positions shown; findings below may reference images not displayed]

FINDINGS: Lungs are clear. Heart size and pulmonary vascularity are normal. No
adenopathy. No bone lesions.
IMPRESSION: No edema or consolidation.

## 2019-04-11 IMAGING — DX DG ABD PORTABLE 1V
1 series · 1 of 1 positions shown · non-contrast
Comparison: CT 05/20/2017.

CLINICAL DATA: Left flank pain.  Left abdominal pain.

EXAM:
PORTABLE ABDOMEN - 1 VIEW

[abdomen kub]
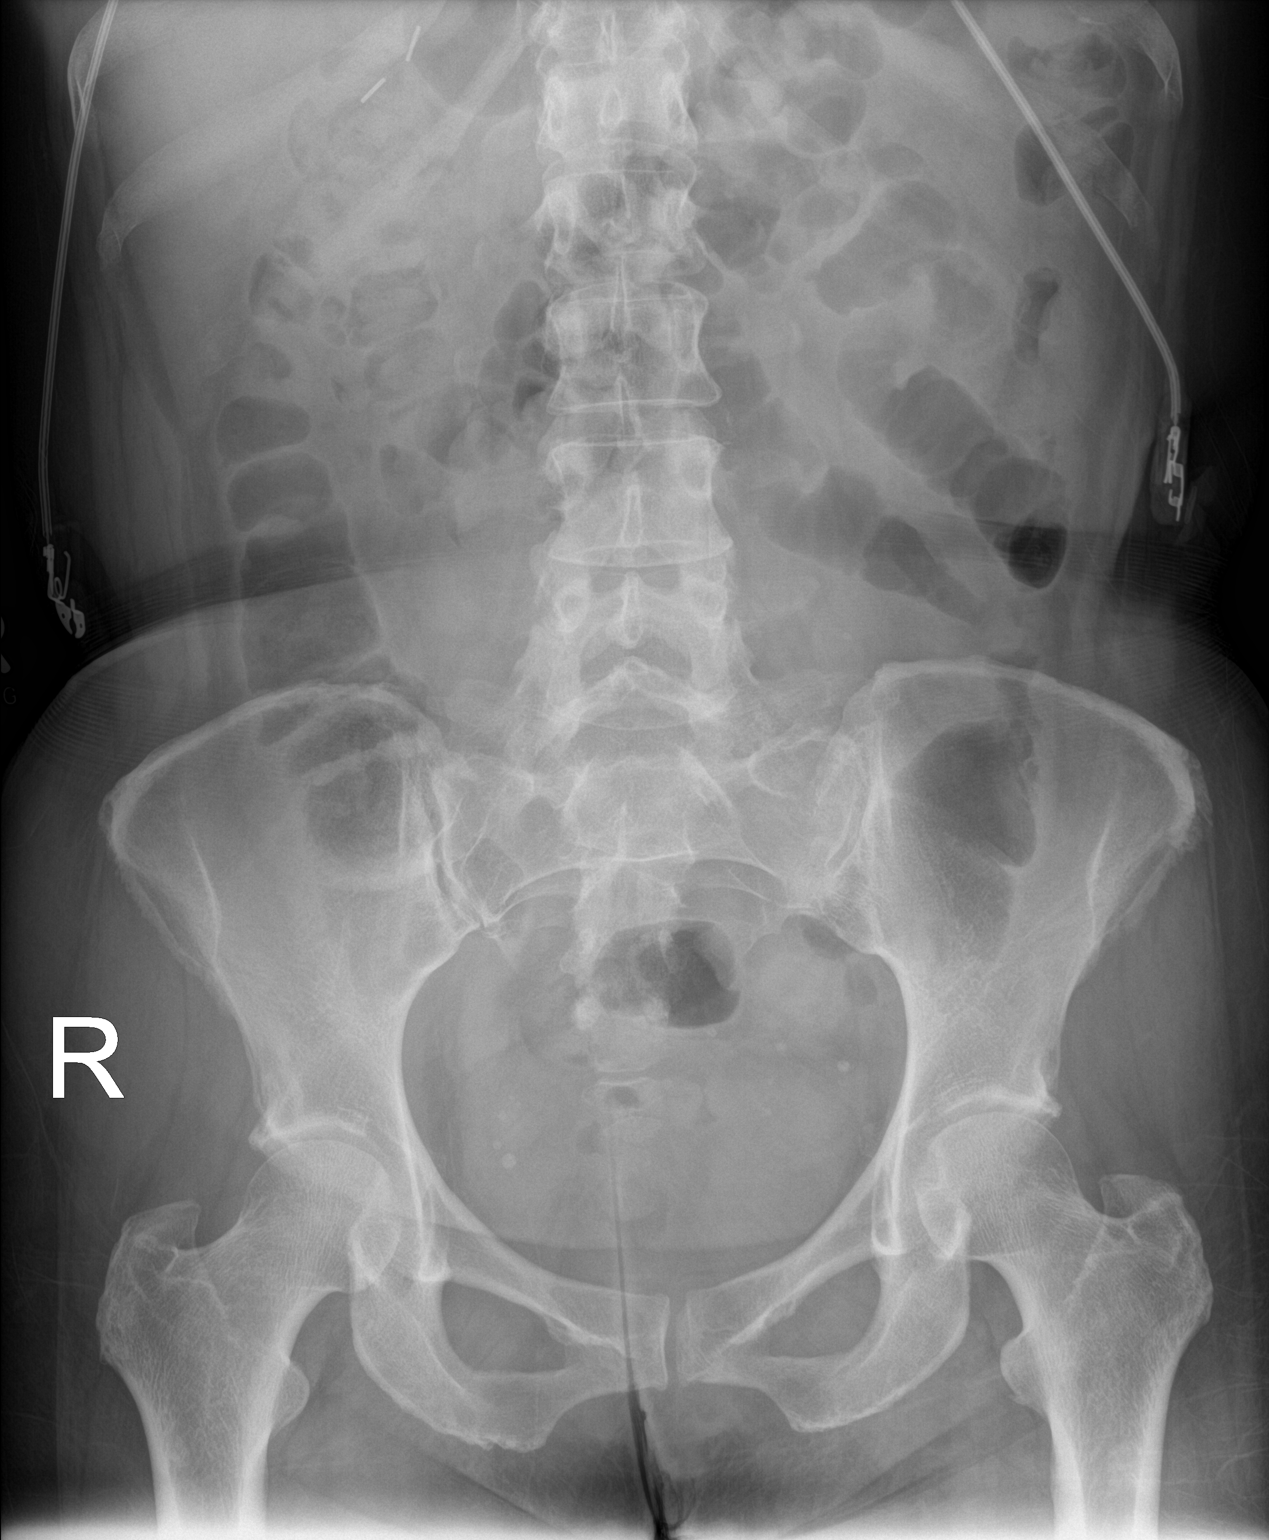

[1 of 1 positions shown; findings below may reference images not displayed]

FINDINGS: Surgical clips right upper quadrant. Soft tissue structures are
unremarkable. Minimally prominent air-filled loops of small and
large bowel are noted. Mild adynamic ileus cannot be excluded.
Pelvic calcifications consistent phleboliths. No acute bony
abnormality.
IMPRESSION: Minimally prominent air-filled loops of small and large bowel noted.
Mild adynamic ileus cannot be excluded.

## 2019-04-17 MED FILL — DULoxetine HCL 30 MG CPEP: 30 | 90 days supply | Qty: 180 | Fill #1

## 2019-04-18 DIAGNOSIS — R42 Dizziness and giddiness: Secondary | ICD-10-CM | POA: Diagnosis not present

## 2019-04-18 DIAGNOSIS — M2550 Pain in unspecified joint: Secondary | ICD-10-CM | POA: Diagnosis not present

## 2019-04-18 DIAGNOSIS — R109 Unspecified abdominal pain: Secondary | ICD-10-CM | POA: Diagnosis not present

## 2019-04-18 MED FILL — MELOXICAM 15 MG TABLET: 15 | 30 days supply | Qty: 30 | Fill #0

## 2019-04-18 MED FILL — ONDANSETRON HCL 8 MG TABLET: 8 | 5 days supply | Qty: 20 | Fill #0

## 2019-05-16 DIAGNOSIS — I10 Essential (primary) hypertension: Secondary | ICD-10-CM | POA: Diagnosis not present

## 2019-05-26 MED FILL — LISINOPRIL 10 MG TABS: 10 | 90 days supply | Qty: 90 | Fill #1

## 2019-05-26 MED FILL — metFORMIN HCL 500 MG TABS: 500 | 90 days supply | Qty: 180 | Fill #1

## 2019-05-30 DIAGNOSIS — M2550 Pain in unspecified joint: Secondary | ICD-10-CM | POA: Diagnosis not present

## 2019-05-30 DIAGNOSIS — F321 Major depressive disorder, single episode, moderate: Secondary | ICD-10-CM | POA: Diagnosis not present

## 2019-05-30 DIAGNOSIS — G44209 Tension-type headache, unspecified, not intractable: Secondary | ICD-10-CM | POA: Diagnosis not present

## 2019-05-30 DIAGNOSIS — M791 Myalgia, unspecified site: Secondary | ICD-10-CM | POA: Diagnosis not present

## 2019-05-30 MED FILL — CYCLOBENZAPRINE HCL 10 MG T: 10 | 30 days supply | Qty: 30 | Fill #0

## 2019-05-30 MED FILL — GLIMEPIRIDE 2 MG TABLET: 2 | 90 days supply | Qty: 90 | Fill #1

## 2019-06-13 DIAGNOSIS — R768 Other specified abnormal immunological findings in serum: Secondary | ICD-10-CM | POA: Diagnosis not present

## 2019-06-13 DIAGNOSIS — M359 Systemic involvement of connective tissue, unspecified: Secondary | ICD-10-CM | POA: Diagnosis not present

## 2019-06-13 DIAGNOSIS — M79642 Pain in left hand: Secondary | ICD-10-CM | POA: Diagnosis not present

## 2019-06-13 DIAGNOSIS — D8989 Other specified disorders involving the immune mechanism, not elsewhere classified: Secondary | ICD-10-CM | POA: Diagnosis not present

## 2019-06-13 DIAGNOSIS — M79643 Pain in unspecified hand: Secondary | ICD-10-CM | POA: Diagnosis not present

## 2019-06-13 DIAGNOSIS — M79641 Pain in right hand: Secondary | ICD-10-CM | POA: Diagnosis not present

## 2019-06-27 DIAGNOSIS — R768 Other specified abnormal immunological findings in serum: Secondary | ICD-10-CM | POA: Diagnosis not present

## 2019-06-27 DIAGNOSIS — L659 Nonscarring hair loss, unspecified: Secondary | ICD-10-CM | POA: Diagnosis not present

## 2019-06-27 DIAGNOSIS — M79643 Pain in unspecified hand: Secondary | ICD-10-CM | POA: Diagnosis not present

## 2019-06-27 DIAGNOSIS — M359 Systemic involvement of connective tissue, unspecified: Secondary | ICD-10-CM | POA: Diagnosis not present

## 2019-06-28 DIAGNOSIS — G43719 Chronic migraine without aura, intractable, without status migrainosus: Secondary | ICD-10-CM | POA: Diagnosis not present

## 2019-06-28 DIAGNOSIS — Z049 Encounter for examination and observation for unspecified reason: Secondary | ICD-10-CM | POA: Diagnosis not present

## 2019-06-28 DIAGNOSIS — Z79899 Other long term (current) drug therapy: Secondary | ICD-10-CM | POA: Diagnosis not present

## 2019-06-28 MED FILL — tiZANidine HCL 4 MG TABS: 4 | 35 days supply | Qty: 20 | Fill #0

## 2019-06-28 MED FILL — ZONISAMIDE 25 MG CAPSULE: 25 | 30 days supply | Qty: 120 | Fill #0

## 2019-07-01 MED FILL — ROSUVASTATIN CALCIUM 20 MG: 20 | 90 days supply | Qty: 90 | Fill #0

## 2019-07-06 DIAGNOSIS — G5600 Carpal tunnel syndrome, unspecified upper limb: Secondary | ICD-10-CM | POA: Diagnosis not present

## 2019-07-10 DIAGNOSIS — G629 Polyneuropathy, unspecified: Secondary | ICD-10-CM | POA: Diagnosis not present

## 2019-07-11 MED FILL — INVOKANA 300 MG TABLET: 300 | 90 days supply | Qty: 90 | Fill #0

## 2019-07-25 DIAGNOSIS — L84 Corns and callosities: Secondary | ICD-10-CM | POA: Diagnosis not present

## 2019-07-25 DIAGNOSIS — L668 Other cicatricial alopecia: Secondary | ICD-10-CM | POA: Diagnosis not present

## 2019-07-25 DIAGNOSIS — L308 Other specified dermatitis: Secondary | ICD-10-CM | POA: Diagnosis not present

## 2019-07-25 MED FILL — FLUOCINOLONE ACETONIDE SCAL: 0.01 | 30 days supply | Qty: 118 | Fill #0

## 2019-07-25 MED FILL — TRIAMCINOLONE ACETONIDE 0.1: 0.1 | 21 days supply | Qty: 454 | Fill #0

## 2019-07-25 MED FILL — LEVOCETIRIZINE 5 MG TABLET: 5 | 30 days supply | Qty: 30 | Fill #0

## 2019-07-28 DIAGNOSIS — Z1231 Encounter for screening mammogram for malignant neoplasm of breast: Secondary | ICD-10-CM | POA: Diagnosis not present

## 2019-08-01 DIAGNOSIS — E1169 Type 2 diabetes mellitus with other specified complication: Secondary | ICD-10-CM | POA: Diagnosis not present

## 2019-08-01 DIAGNOSIS — G43709 Chronic migraine without aura, not intractable, without status migrainosus: Secondary | ICD-10-CM | POA: Diagnosis not present

## 2019-08-01 DIAGNOSIS — I10 Essential (primary) hypertension: Secondary | ICD-10-CM | POA: Diagnosis not present

## 2019-08-01 DIAGNOSIS — E042 Nontoxic multinodular goiter: Secondary | ICD-10-CM | POA: Diagnosis not present

## 2019-08-01 DIAGNOSIS — E785 Hyperlipidemia, unspecified: Secondary | ICD-10-CM | POA: Diagnosis not present

## 2019-08-01 MED FILL — CONTOUR NEXT STRIPS: 90 days supply | Qty: 100 | Fill #0

## 2019-08-01 MED FILL — MICROLET LANCETS MISC: 90 days supply | Qty: 100 | Fill #0

## 2019-08-01 MED FILL — DULoxetine HCL 30 MG CPEP: 30 | 90 days supply | Qty: 180 | Fill #0

## 2019-08-02 MED FILL — GLIMEPIRIDE 4 MG TABS: 4 | 90 days supply | Qty: 90 | Fill #0

## 2019-08-02 MED FILL — METFORMIN HCL 500 MG TABS: 500 | 90 days supply | Qty: 180 | Fill #0

## 2019-08-02 MED FILL — LISINOPRIL 10 MG TABS: 10 | 90 days supply | Qty: 90 | Fill #0

## 2019-08-22 DIAGNOSIS — J301 Allergic rhinitis due to pollen: Secondary | ICD-10-CM | POA: Diagnosis not present

## 2019-08-22 DIAGNOSIS — R42 Dizziness and giddiness: Secondary | ICD-10-CM | POA: Diagnosis not present

## 2019-08-22 MED FILL — FLUTICASONE PROP 50 MCG SPR: 50 | 30 days supply | Qty: 16 | Fill #0

## 2019-08-22 MED FILL — MECLIZINE 25 MG TABLET: 25 | 7 days supply | Qty: 30 | Fill #0

## 2019-08-23 DIAGNOSIS — R1084 Generalized abdominal pain: Secondary | ICD-10-CM | POA: Diagnosis not present

## 2019-08-23 DIAGNOSIS — B961 Klebsiella pneumoniae [K. pneumoniae] as the cause of diseases classified elsewhere: Secondary | ICD-10-CM | POA: Diagnosis not present

## 2019-08-23 DIAGNOSIS — R8271 Bacteriuria: Secondary | ICD-10-CM | POA: Diagnosis not present

## 2019-08-23 DIAGNOSIS — N390 Urinary tract infection, site not specified: Secondary | ICD-10-CM | POA: Diagnosis not present

## 2019-08-23 DIAGNOSIS — Z87442 Personal history of urinary calculi: Secondary | ICD-10-CM | POA: Diagnosis not present

## 2019-08-23 MED FILL — CYCLOBENZAPRINE HCL 10 MG T: 10 | 10 days supply | Qty: 30 | Fill #0

## 2019-08-29 DIAGNOSIS — M6281 Muscle weakness (generalized): Secondary | ICD-10-CM | POA: Diagnosis not present

## 2019-08-29 DIAGNOSIS — R1084 Generalized abdominal pain: Secondary | ICD-10-CM | POA: Diagnosis not present

## 2019-08-29 DIAGNOSIS — M545 Low back pain: Secondary | ICD-10-CM | POA: Diagnosis not present

## 2019-08-29 DIAGNOSIS — M62838 Other muscle spasm: Secondary | ICD-10-CM | POA: Diagnosis not present

## 2019-08-29 MED FILL — CEPHALEXIN 500 MG CAPSULE: 500 | 7 days supply | Qty: 21 | Fill #0

## 2019-10-18 ENCOUNTER — Other Ambulatory Visit: Payer: Self-pay | Admitting: Physician Assistant

## 2019-10-18 DIAGNOSIS — H9313 Tinnitus, bilateral: Secondary | ICD-10-CM | POA: Diagnosis not present

## 2019-10-18 DIAGNOSIS — R42 Dizziness and giddiness: Secondary | ICD-10-CM | POA: Diagnosis not present

## 2019-10-18 DIAGNOSIS — E049 Nontoxic goiter, unspecified: Secondary | ICD-10-CM

## 2019-10-18 DIAGNOSIS — H6121 Impacted cerumen, right ear: Secondary | ICD-10-CM | POA: Diagnosis not present

## 2019-10-18 DIAGNOSIS — E040 Nontoxic diffuse goiter: Secondary | ICD-10-CM

## 2019-10-30 ENCOUNTER — Other Ambulatory Visit: Payer: BLUE CROSS/BLUE SHIELD

## 2019-10-30 DIAGNOSIS — H04123 Dry eye syndrome of bilateral lacrimal glands: Secondary | ICD-10-CM | POA: Diagnosis not present

## 2019-10-30 DIAGNOSIS — H1045 Other chronic allergic conjunctivitis: Secondary | ICD-10-CM | POA: Diagnosis not present

## 2019-10-30 DIAGNOSIS — H2513 Age-related nuclear cataract, bilateral: Secondary | ICD-10-CM | POA: Diagnosis not present

## 2019-10-30 DIAGNOSIS — E119 Type 2 diabetes mellitus without complications: Secondary | ICD-10-CM | POA: Diagnosis not present

## 2019-10-31 ENCOUNTER — Ambulatory Visit
Admission: RE | Admit: 2019-10-31 | Discharge: 2019-10-31 | Disposition: A | Discharge status: Home / Self Care | Payer: BLUE CROSS/BLUE SHIELD | Source: Ambulatory Visit | Attending: Physician Assistant | Admitting: Physician Assistant

## 2019-10-31 DIAGNOSIS — E049 Nontoxic goiter, unspecified: Secondary | ICD-10-CM

## 2019-10-31 DIAGNOSIS — E041 Nontoxic single thyroid nodule: Secondary | ICD-10-CM | POA: Diagnosis not present

## 2019-10-31 DIAGNOSIS — E040 Nontoxic diffuse goiter: Secondary | ICD-10-CM

## 2019-11-09 DIAGNOSIS — E785 Hyperlipidemia, unspecified: Secondary | ICD-10-CM | POA: Diagnosis not present

## 2019-11-09 DIAGNOSIS — I10 Essential (primary) hypertension: Secondary | ICD-10-CM | POA: Diagnosis not present

## 2019-11-09 DIAGNOSIS — G43709 Chronic migraine without aura, not intractable, without status migrainosus: Secondary | ICD-10-CM | POA: Diagnosis not present

## 2019-11-09 DIAGNOSIS — R413 Other amnesia: Secondary | ICD-10-CM | POA: Diagnosis not present

## 2019-11-09 DIAGNOSIS — E1169 Type 2 diabetes mellitus with other specified complication: Secondary | ICD-10-CM | POA: Diagnosis not present

## 2019-11-09 MED FILL — TOPIRAMATE 50 MG TABLET: 50 | 30 days supply | Qty: 30 | Fill #0

## 2019-11-09 MED FILL — ROSUVASTATIN CALCIUM 20 MG: 20 | 90 days supply | Qty: 90 | Fill #0

## 2019-11-09 MED FILL — GLIMEPIRIDE 4 MG TABS: 4 | 90 days supply | Qty: 90 | Fill #0

## 2019-11-09 MED FILL — JARDIANCE 25 MG TABLET: 25 | 90 days supply | Qty: 90 | Fill #0

## 2019-11-09 MED FILL — DULoxetine HCL 30 MG CPEP: 30 | 90 days supply | Qty: 180 | Fill #0

## 2019-11-09 MED FILL — METFORMIN HCL 500 MG TABS: 500 | 90 days supply | Qty: 180 | Fill #0

## 2019-11-09 MED FILL — LISINOPRIL 10 MG TABS: 10 | 90 days supply | Qty: 90 | Fill #0

## 2019-12-18 MED FILL — TOPIRAMATE 50 MG TABLET: 50 | 30 days supply | Qty: 30 | Fill #1

## 2019-12-20 MED FILL — MECLIZINE HCL 25 MG TABS: 25 | 7 days supply | Qty: 30 | Fill #0

## 2020-01-30 ENCOUNTER — Other Ambulatory Visit (HOSPITAL_COMMUNITY): Payer: Self-pay | Admitting: Family Medicine

## 2020-01-30 DIAGNOSIS — E785 Hyperlipidemia, unspecified: Secondary | ICD-10-CM | POA: Diagnosis not present

## 2020-01-30 DIAGNOSIS — E1169 Type 2 diabetes mellitus with other specified complication: Secondary | ICD-10-CM | POA: Diagnosis not present

## 2020-01-30 DIAGNOSIS — E559 Vitamin D deficiency, unspecified: Secondary | ICD-10-CM | POA: Diagnosis not present

## 2020-01-30 DIAGNOSIS — G43709 Chronic migraine without aura, not intractable, without status migrainosus: Secondary | ICD-10-CM | POA: Diagnosis not present

## 2020-01-30 DIAGNOSIS — I10 Essential (primary) hypertension: Secondary | ICD-10-CM | POA: Diagnosis not present

## 2020-01-30 DIAGNOSIS — Z0000 Encounter for general adult medical examination without abnormal findings: Secondary | ICD-10-CM | POA: Diagnosis not present

## 2020-01-30 MED FILL — ROSUVASTATIN CALCIUM 20 MG: 20 | 90 days supply | Qty: 90 | Fill #0

## 2020-01-30 MED FILL — LISINOPRIL 10 MG TABS: 10 | 90 days supply | Qty: 90 | Fill #0

## 2020-01-30 MED FILL — buPROPion HCL ER (XL) 150 M: 150 | 90 days supply | Qty: 90 | Fill #0

## 2020-01-30 MED FILL — DULoxetine HCL 30 MG CPEP: 30 | 90 days supply | Qty: 180 | Fill #0

## 2020-01-30 MED FILL — NURTEC 75 MG TBDP: 75 | 30 days supply | Qty: 8 | Fill #0

## 2020-01-30 MED FILL — METFORMIN HCL 500 MG TABS: 500 | 90 days supply | Qty: 180 | Fill #0

## 2020-01-30 MED FILL — GLIMEPIRIDE 4 MG TABS: 4 | 90 days supply | Qty: 180 | Fill #0

## 2020-01-30 MED FILL — JARDIANCE 25 MG TABLET: 25 | 90 days supply | Qty: 90 | Fill #0

## 2020-03-06 MED FILL — RIZATRIPTAN BENZOATE 10 MG: 10 | 30 days supply | Qty: 9 | Fill #0

## 2020-03-12 ENCOUNTER — Encounter: Payer: Self-pay | Admitting: Neurology

## 2020-03-14 ENCOUNTER — Other Ambulatory Visit (HOSPITAL_COMMUNITY): Payer: Self-pay | Admitting: Family Medicine

## 2020-03-15 MED FILL — AIMOVIG 70 MG/ML SOAJ: 70 | 28 days supply | Qty: 1 | Fill #0

## 2020-04-28 NOTE — Progress Notes (Signed)
NEUROLOGY CONSULTATION NOTE  [name] MRN: [id number] DOB: 08/18/1965  Referring provider: Marilynne Drivers, PA Primary care provider: Marilynne Drivers, PA  Reason for consult:  dizziness   Subjective:  [name] is a 55 year old left-handed female with type 2 diabetes and thyroid goiter who presents for dizziness and memory deficits.  History supplemented by ENT and referring provider's notes.  She reports feeling off-balance over the past 5 years.  Some were related to dizziness but other times she just loses balance.  She has fallen on some occasions. She sometimes feels numb in the legs but not weakness.  No double vision.  She also developed a constant buzzing in both ears.  She saw ENT over the summer.  Audiometric testing and Dix-Hallpike were both negative.    She also reports memory problems since around 2020.  She reports walking around her house trying to remember what she needs to do. She has forgotten how to drive back home on familiar routes.  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  She has sometimes forgotten which bills she has paid.  No family history of dementia.  She had an episode of syncope in November 2020.  CT head at that time was unremarkable.     PAST MEDICAL HISTORY: Past Medical History:  Diagnosis Date  . Arthritis    left shoulder   . Chicken pox    CHILDHOOD  . Depression   . Diabetes mellitus without complication ([place])    type 2  . Headache(784.0)    migraine  . Hyperlipidemia   . Hypertension   . Hyperthyroidism   . Left ureteral stone   . Low iron 04/2017   history of   . Multinodular goiter 12/28/2012  . Sleep apnea    currently no cpap ; waiting to have another sleep study     PAST SURGICAL HISTORY: Past Surgical History:  Procedure Laterality Date  . CHOLECYSTECTOMY  2006   emergency surgery . unaware of  surgeon ; states done at [facility] [place]   . COLONOSCOPY  10/2016  . CYSTOSCOPY W/ URETERAL STENT PLACEMENT Left 05/21/2017   Procedure: CYSTOSCOPY WITH RETROGRADE PYELOGRAM/URETERAL STENT PLACEMENT;  Surgeon: Nickie Retort, MD;  Location: [facility];  Service: Urology;  Laterality: Left;  . CYSTOSCOPY/URETEROSCOPY/HOLMIUM LASER/STENT PLACEMENT Left 06/14/2017   Procedure: CYSTOSCOPY/URETEROSCOPY/STENT REMOVAL;  Surgeon: Nickie Retort, MD;  Location[facility];  Service: Urology;  Laterality: Left;  . ROBOT ASSISTED MYOMECTOMY Left 06/02/2012   Procedure: ROBOTIC ASSISTED MYOMECTOMY;  Surgeon: Marvene Staff, MD;  Location: [facility];  Service: Gynecology;  Laterality: Left;  anexal mass is a myoma left side  . ROBOTIC ASSISTED LAPAROSCOPIC LYSIS OF ADHESION N/A 06/02/2012   Procedure: ROBOTIC ASSISTED LAPAROSCOPIC LYSIS OF ADHESION;  Surgeon: Marvene Staff, MD;  Location: [facility];  Service: Gynecology;  Laterality: N/A;  . ROBOTIC ASSISTED SALPINGO OOPHERECTOMY Left 06/02/2012   Procedure: ROBOTIC ASSISTED SALPINGO OOPHORECTOMY;  Surgeon: Marvene Staff, MD;  Location: [facility];  Service: Gynecology;  Laterality: Left;  pelvic washings  . ULNAR NERVE TRANSPOSITION Right 08/15/2013   Procedure: RIGHT ULNAR NERVE IN SITU DECOMPRESSION/POSSIBLE TRANSPOSITION;  Surgeon: Cammie Sickle., MD;  Location: [place];  Service: Orthopedics;  Laterality: Right;  Marland Kitchen VAGINAL HYSTERECTOMY  09/15/2005   with right salpingectomy and uterine morcellation ;  Dr Ena Dawley; [facility]     MEDICATIONS: Current Outpatient Medications on File Prior to Visit  Medication Sig Dispense Refill  . aspirin 81 MG tablet Take 81 mg by mouth daily.    . cephALEXin (KEFLEX) 500 MG capsule Take 1 capsule (500 mg total) by mouth 4 (four) times daily. 6 capsule 0  . DULoxetine (CYMBALTA) 60 MG capsule Take 60 mg by mouth daily.  0  . glucose blood (ONETOUCH VERIO)  test strip Use as instructed 50 each 2  . INVOKANA 100 MG TABS tablet TAKE 1 TABLET BY MOUTH BEFORE BREAKFAST 30 tablet 3  . IRON PO Take by mouth. States she takes once a day    . lisinopril (PRINIVIL,ZESTRIL) 10 MG tablet TAKE 1 TABLET (10 MG TOTAL) BY MOUTH DAILY. 30 tablet 0  . metFORMIN (GLUCOPHAGE-XR) 500 MG 24 hr tablet TAKE 4 TABLETS BY MOUTH DAILY WITH SUPPER. 120 tablet 3  . naproxen (NAPROSYN) 500 MG tablet Take 1 tablet (500 mg total) by mouth every 12 (twelve) hours as needed for mild pain. 10 tablet 0  . pravastatin (PRAVACHOL) 40 MG tablet Take 1 tablet (40 mg total) by mouth daily. 90 tablet 3  . VICTOZA 18 MG/3ML SOPN INJECT 1.8 MG AS DIRECTED DAILY 27 mL 2  . vitamin B-12 (CYANOCOBALAMIN) 1000 MCG tablet Take 1 tablet (1,000 mcg total) by mouth daily. 30 tablet 0  . Vitamin D, Ergocalciferol, (DRISDOL) 50000 units CAPS capsule Take 50,000 Units by mouth once a week.  3   No current facility-administered medications on file prior to visit.    ALLERGIES: No Known Allergies  FAMILY HISTORY: Family History  Problem Relation Age of Onset  . Arthritis Mother   . Arthritis Father   . Hyperlipidemia Father   . Diabetes Father   . Hypertension Sister   . Mental illness Sister   . Diabetes Sister   . Diabetes Brother   . Cancer Paternal Aunt        BREAST   SOCIAL HISTORY: Social History   Socioeconomic History  . Marital status: Married    Spouse name: Not on file  . Number of children: Not on file  . Years of education: Not on file  . Highest education level: Not on file  Occupational History  . Not on file  Tobacco Use  . Smoking status: Never Smoker  . Smokeless tobacco: Never Used  Vaping Use  . Vaping Use: Never used  Substance and Sexual Activity  . Alcohol use: Yes    Comment: wine cooler once aq month   . Drug use: No  . Sexual activity: Not on file  Other Topics Concern  . Not on file  Social History Narrative   Work or School: Designer, multimedia      Home Situation: lives with fiance      Spiritual Beliefs:seventh day adventist      Lifestyle: no regular exercise; diet is ok      Social Determinants of Radio broadcast assistant Strain: Not on file  Food Insecurity: Not on file  Transportation Needs: Not on file  Physical Activity: Not on file  Stress: Not on file  Social Connections: Not on file  Intimate Partner Violence: Not on file    Objective:  Blood pressure 112/78, pulse 71, resp. rate 20, height 5\' 3"  (1.6 m), weight 164 lb (74.4 kg), SpO2 96 %. General: No acute distress.  Patient appears well-groomed.  Head:  Normocephalic/atraumatic Eyes:  fundi examined but not visualized Neck: supple, no paraspinal tenderness, full range of motion Back: No paraspinal tenderness Heart: regular rate and rhythm Lungs: Clear to auscultation bilaterally. Vascular: No carotid bruits. Neurological Exam: Mental status:  [employer] Mental Exam 04/30/2020  Weekday Correct 1  Current year 1  What state are we in? 1  Amount spent 1  Amount left 2  # of Animals 3  5 objects recall 3  Number series 0  Hour markers 2  Time correct 2  Placed X in triangle correctly 1  Largest Figure 1  Name of female 2  Date back to work 0  Type of work 2  State she lived in 0  Total score 22   Cranial nerves: CN I: not tested CN II: pupils equal, round and reactive to light, visual fields intact CN III, IV, VI:  full range of motion, no nystagmus, no ptosis CN V: facial sensation intact. CN VII: upper and lower face symmetric CN VIII: hearing intact CN IX, X: gag intact, uvula midline CN XI: sternocleidomastoid and trapezius muscles intact CN XII: tongue midline Bulk & Tone: normal, no fasciculations. Motor:  muscle strength 5/5 throughout Sensation:  Pinprick, temperature and vibratory sensation intact. Deep Tendon Reflexes:  2+ throughout,  toes downgoing.   Finger to nose  testing:  Without dysmetria.   Heel to shin:  Without dysmetria.   Gait:  Normal station and stride.  Romberg negative.  Assessment/Plan:   1.  Unsteady gait -  On occasion there may have been dizziness, but usually she reports just losing her balance.  She does have diabetes and reports numbness in the feet but objective testing does not reveal any significant sensory deficits 2.  Memory deficits  1. Check MRI of brain without contrast 2. Check B12 and TSH 3.  Refer to neuropsychological evaluation 4.  Follow up after testing.    Thank you for allowing me to take part in the care of this patient.  Shon Millet, DO  CC: Horton Marshall, PA

## 2020-04-30 ENCOUNTER — Ambulatory Visit: Payer: BC Managed Care – PPO | Admitting: Neurology

## 2020-04-30 ENCOUNTER — Other Ambulatory Visit: Payer: Self-pay

## 2020-04-30 ENCOUNTER — Other Ambulatory Visit (INDEPENDENT_AMBULATORY_CARE_PROVIDER_SITE_OTHER): Payer: BC Managed Care – PPO

## 2020-04-30 ENCOUNTER — Encounter: Payer: Self-pay | Admitting: Neurology

## 2020-04-30 VITALS — BP 112/78 | HR 71 | Resp 20 | Ht 63.0 in | Wt 164.0 lb

## 2020-04-30 DIAGNOSIS — R413 Other amnesia: Secondary | ICD-10-CM | POA: Diagnosis not present

## 2020-04-30 DIAGNOSIS — R2681 Unsteadiness on feet: Secondary | ICD-10-CM | POA: Diagnosis not present

## 2020-04-30 DIAGNOSIS — R200 Anesthesia of skin: Secondary | ICD-10-CM

## 2020-04-30 DIAGNOSIS — R202 Paresthesia of skin: Secondary | ICD-10-CM

## 2020-04-30 LAB — TSH: TSH: 0.75 u[IU]/mL (ref 0.35–4.50)

## 2020-04-30 LAB — VITAMIN B12: Vitamin B-12: 623 pg/mL (ref 211–911)

## 2020-04-30 NOTE — Patient Instructions (Signed)
1.  We will check MRI of brain without contrast 2.  We will check B12 and TSH 3.  I will refer you for neuropsychological testing 4.  Follow up after testing.

## 2020-05-07 MED FILL — AIMOVIG 70 MG/ML SOAJ: 70 | 28 days supply | Qty: 1 | Fill #1

## 2020-05-07 MED FILL — buPROPion HCL ER (XL) 150 M: 150 | 90 days supply | Qty: 90 | Fill #1

## 2020-05-16 MED FILL — JARDIANCE 25 MG TABLET: 25 | 90 days supply | Qty: 90 | Fill #1

## 2020-05-16 MED FILL — ROSUVASTATIN CALCIUM 20 MG: 20 | 90 days supply | Qty: 90 | Fill #1

## 2020-05-18 ENCOUNTER — Ambulatory Visit
Admission: RE | Admit: 2020-05-18 | Discharge: 2020-05-18 | Disposition: A | Discharge status: Home / Self Care | Payer: BC Managed Care – PPO | Source: Ambulatory Visit | Attending: Neurology | Admitting: Neurology

## 2020-05-18 ENCOUNTER — Other Ambulatory Visit: Payer: Self-pay

## 2020-05-18 DIAGNOSIS — R413 Other amnesia: Secondary | ICD-10-CM | POA: Diagnosis not present

## 2020-05-18 DIAGNOSIS — R519 Headache, unspecified: Secondary | ICD-10-CM | POA: Diagnosis not present

## 2020-05-18 DIAGNOSIS — R200 Anesthesia of skin: Secondary | ICD-10-CM | POA: Diagnosis not present

## 2020-05-18 DIAGNOSIS — R251 Tremor, unspecified: Secondary | ICD-10-CM | POA: Diagnosis not present

## 2020-06-11 ENCOUNTER — Other Ambulatory Visit: Payer: Self-pay

## 2020-06-11 ENCOUNTER — Ambulatory Visit (INDEPENDENT_AMBULATORY_CARE_PROVIDER_SITE_OTHER): Payer: BC Managed Care – PPO | Admitting: Counselor

## 2020-06-11 ENCOUNTER — Encounter: Payer: Self-pay | Admitting: Counselor

## 2020-06-11 ENCOUNTER — Ambulatory Visit: Payer: BC Managed Care – PPO

## 2020-06-11 DIAGNOSIS — G3184 Mild cognitive impairment, so stated: Secondary | ICD-10-CM

## 2020-06-11 DIAGNOSIS — F09 Unspecified mental disorder due to known physiological condition: Secondary | ICD-10-CM

## 2020-06-11 DIAGNOSIS — G4700 Insomnia, unspecified: Secondary | ICD-10-CM

## 2020-06-11 DIAGNOSIS — R42 Dizziness and giddiness: Secondary | ICD-10-CM

## 2020-06-11 NOTE — Progress Notes (Signed)
   Psychometrist Note   Cognitive testing was administered to [employer] by Lamar Benes, B.S. (Technician) under the supervision of Alphonzo Severance, Psy.D., ABN. Ms. Candise Bowens was able to tolerate all test procedures. Dr. Nicole Kindred met with the patient as needed to manage any emotional reactions to the testing procedures. Rest breaks were offered.    The battery of tests administered was selected by Dr. Nicole Kindred with consideration to the patient's current level of functioning, the nature of her symptoms, emotional and behavioral responses during the interview, level of literacy, observed level of motivation/effort, and the nature of the referral question. This battery was communicated to the psychometrist. Communication between Dr. Nicole Kindred and the psychometrist was ongoing throughout the evaluation and Dr. Nicole Kindred was immediately accessible at all times. Dr. Nicole Kindred provided supervision to the technician on the date of this service, to the extent necessary to assure the quality of all services provided.    Ms. Candise Bowens will return in approximately one week for an interactive feedback session with Dr. Nicole Kindred, at which time test performance, clinical impressions, and treatment recommendations will be reviewed in detail. The patient understands she can contact our office should she require our assistance before this time.   A total of 120 minutes of billable time were spent with [place] by the technician, including test administration and scoring time. Billing for these services is reflected in Dr. Les Pou note.   This note reflects time spent with the psychometrician and does not include test scores, clinical history, or any interpretations made by Dr. Nicole Kindred. The full report will follow in a separate note.
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
## 2020-06-11 NOTE — Progress Notes (Signed)
[facility]  Patient Name: [name] MRN: [id number] Date of Birth: [birth year] Age: 55 y.o. Education: 12 years  Measurement properties of test scores: IQ, Index, and Standard Scores (SS): Mean = 100; Standard Deviation = 15 Scaled Scores (Ss): Mean = 10; Standard Deviation = 3 Z scores (Z): Mean = 0; Standard Deviation = 1 T scores (T); Mean = 50; Standard Deviation = 10  TEST SCORES:    Note: This summary of test scores accompanies the interpretive report and should not be interpreted by unqualified individuals or in isolation without reference to the report. Test scores are relative to age, gender, and educational history as available and appropriate.   Performance Validity        ACS: Raw Descriptor      Word Choice: 49 Within Expectation      MSVT: Raw Descriptor      IR 100 Within Expectation      DR 90 Within Expectation      CNS 90 Within Expectation      PA 100 ---      FR 70 ---      The Dot Counting Test: Raw Descriptor      E-Score 9 Within Expectation      Embedded Measures: Raw Descriptor      RBANS Effort Index: 2 Within Expectation      WAIS-IV Reliable Digit Span 7 Within Expectation      WAIS-IV Reliable Digit Span Revised 11 Within Expectation      Expected Functioning        Wide Range Achievement Test (Word Reading): Standard/Scaled Score Percentile       Word Reading 86 18      Reynolds Intellectual Screening Test Standard/T-score Percentile      Guess What 45 31      Odd Item Out 52 58  RIST Index 98 45      Cognitive Testing        RBANS, Form : Standard/Scaled Score Percentile  Total Score 85 16  Immediate Memory 73 4      List Learning 4 2      Story Memory 6 9  Visuospatial/Constructional 109 73      Figure Copy   (20) 13 84      Judgment of Line Orientation   (17) --- 26-50  Language 97 42      Picture Naming --- 17-25      Semantic Fluency 9 37  Attention 88 21      Digit  Span 11 63      Coding 5 5  Delayed Memory 78 7      List Recall   (4) --- 17-25      List Recognition   (15) --- <2      Story Recall   (8) 8 25      Figure Recall   (16) 12 75      Wechsler Adult Intelligence Scale - IV: Standard/Scaled Score Percentile  Working Memory Index 80 9      Digit Span 7 16          Digit Span Forward 8 25          Digit Span Backward 6 9          Digit Span Sequencing 9 37      Arithmetic 6 9  Processing Speed Index 89 23      Symbol Search 9 37  Coding 7 16      Neuropsychological Assessment Battery (Language Module): T-score Percentile      Naming   (29) 43 25      Verbal Fluency: T-score Percentile      Controlled Oral Word Association (F-A-S) 63 91      Semantic Fluency (Animals) 50 50      Trail Making Test: T-Score Percentile      Part A 46 34      Part B 47 38      Modified Wisconsin Card Sorting Test (MWCST): Standard/T-Score Percentile      Number of Categories Correct 41 18      Number of Perseverative Errors 64 92      Number of Total Errors 47 38      Percent Perseverative Errors 62 88  Executive Function Composite 104 61      Rating Scales         Raw Score Descriptor  Patient Health Questionnaire - 9 16 Moderately-Severe  GAD-7 14 Moderate    Jaz Mallick V. Nicole Kindred PsyD, [place] Clinical Neuropsychologist

## 2020-06-12 DIAGNOSIS — L509 Urticaria, unspecified: Secondary | ICD-10-CM | POA: Diagnosis not present

## 2020-06-12 NOTE — Progress Notes (Signed)
[facility]  Patient Name: [name] MRN: [id number] Date of Birth: 1965-10-25 Age: 55 y.o. Education: 12 years  Clinical Impressions  [name] is a 55 y.o., left-hand dominant, married woman with a history of subjective poor balance, dizziness, thyroid goiter, subjective numbness in the legs, and memory and thinking problems. She was referred by Dr. Tomi Likens who completed reversible laboratory workup, MRI of the brain (normal) and did not find any objective deficits on elemental neurological exam. Her cognitive difficulties started approximately 1 to 1.5 years ago and were first noticed at work [employer]). She was forgetting what she needed to do, was getting confused going to work sites that she had been familiar with for 25 years, and was also having problems with purchase orders. She was not fired and her supervisors told her she was doing fine and not to worry about it, but she thought it was too much and ended up quitting at the recommendation of an attorney in January, 2022 during the disability application process. She continues to notice her symptoms at home in terms of missing bills, some confusion with directions/getting lost temporarily and she feels absent minded.   On neuropsychological testing, [name] demonstrated some patterns of weakness with respect to memory encoding, delayed recognition of unstructured information, and she had middling performance on working memory measures that may to some extent reflect preexisting strengths and weaknesses (said math was not a strong subject). Importantly, her memory testing does not show a storage problem pattern. Indicators of processing efficiency fell at an expected level overall despite a low score and she performed well on several challenging measures of executive functioning involving attention and memory abilities. She had no difficulties with measures of  language function or visuospatial function. She did report moderately severe levels of depressive symptoms including little interest or pleasure nearly every day, and feeling down depressed and hopeless more than half the days over the past week. She reported moderate levels of anxiety symptoms.   [name] is thus demonstrating scattered low scores on measures of memory encoding, delayed recall, processing speed, and working memory. Nevertheless, there is no frank or focal impairment (I.e., a consistent pattern of deficient performance) in any area. My sense is that her problems relate to executive control, cognitive efficiency, and performance consistency factors. While some of her day-to-day symptoms are potentially concerning, her objective test data are not particularly ominous for a developing progressive condition. Rather, I think she may have interference from significant affective distress, insufficient sleep, and asthenia related to her multiple physical complaints. I do not see outside labs although there is mention that her diabetes is also suboptimally controlled (HbA1c of 8.3% in 07/2019), which may be further contributing to her general feelings of ill health. Recommend that she work with her PCP on addressing these issues, initiate behavioral activation, discuss antidepressant medication with psychiatry or her PCP, and consider increasing exercise or even PT for her subjective poor balance. She may also benefit from sleep hygiene and/or formal CBTi if sleep hygiene is ineffective.   Diagnostic Impressions: Major depressive disorder, recurrent, moderate, with anxious distress Other symptoms and signs involving cognitive functions and awareness  Recommendations to be discussed with patient  Your performance and presentation on assessment were consistent with some scattered areas of low scores; however, you also had reasonable scores in many of the areas that you had difficulty with and  you did well on several challenging measures. Accordingly, I do not think  that "impairment" is the best term for your problems; you have some mild difficulties with cognitive efficiency and executive control that are likely interfering with performance consistency. The good news is that these are the most common type of difficulty I see in my patients when there is NOT an underlying neurodegenerative cause such as dementia responsible, and they often respond well to treatment.   In terms of the specific nature of your difficulties, I think your cognitive problems are best summed up as involving difficulties with executive control. Executive control is a higher order cognitive ability involved in regulating other cognitive resources. Much like the conductor of an orchestra coordinates multiple instruments to make music, executive capacities coordinate other lower-order skills (e.g., movement, language, attention) to form complex human behaviors. Individuals with executive control problems are often capable of doing most of the things they did before they were having problems, but they may not do so as effortlessly, efficiently, and consistently. These difficulties often manifest as problems tracking information, multitasking, and paying attention. Executive control problems often result in cognitive inefficiency and can present as "memory problems," because they decrease encoding and spontaneous retrieval of information.   These difficulties are likely due to your emotional concerns, insufficient sleep, low energy/subjective poor health, and there may also be some confound from poorly controlled diabetes, which can affect cognition by reducing stamina, contributing to fatigue, and other mechanisms. Once you get these things under control I think you will be feeling better cognitively.   There are few things as disruptive to brain functioning as not getting a good night's sleep. For sleep, I recommend against  using medications, which can have lingering sedating effects on the brain and rob your brain of restful REM sleep. Instead, consider trying some of the following sleep hygiene recommendations. They may not work at once and may take effort, but the effort you spend is likely to be rewarded with better sleep eventually:  . Stick to a sleep schedule of the same bedtime and wake time even on the weekends, which can help to regulate your body's internal clock so that you fall asleep and stay asleep.  . Practice a relaxing bedtime ritual (conducted away from bright lights) which will help separate your sleep from stimulating activities and prepare your body to fall asleep when you go to bed.  . Avoid naps, especially in the afternoon.  . Evaluate your room and create conditions that will promote sleep such as keeping it cool (between 60 - 67 degrees), quiet, and free from any lights. Consider using blackout curtains, a "white noise" generator, or fan that will help mask any noises that might prevent you from going to sleep or awaken you during the night.  . Sleep on a comfortable mattress and pillows.  . Avoid bright light in the evening and excessive use of portable electronic devices right before bed that may contain light frequencies that can contribute to sleep problems.  . Avoid alcohol, cigarettes, or heavy meals in the evening. If you must eat, consume a light snack 45 minutes before bed.  . Use your bed only for sleep to strengthen the association between your bed and sleep.  . If you can't go to sleep within 30 minutes, go into another room and do something relaxing until you feel tired. Then, come back and try to go to sleep again for 30 minutes and repeat until sleep is achieved.  . Some people find over the counter melatonin to be helpful for sleep,  which you could discuss with a pharmacist or prescribing provider.   Depression can affect cognitive functioning in several ways. For one, there are  neurobiological changes in depression that can contribute to attention, concentration, and memory problems. These changes are so common they are part of the criteria we use to diagnose depressive disorders. Depression can also negatively impact your own appraisal of your cognitive abilities, leading you to feel like you are performing more poorly than is objectively warranted. In your case, you scored at a high level for anxiety and depressive symptoms, which often go together. I think you should ask your PCP about medication and you may also benefit from counseling.   There is a significant research base and evidence of effectiveness for something called "behavioral activation," which is a fancy way of saying that you should increase your activity level. In general, people do not feel as happy or do as well when they are not doing much. This can include things like getting out for walks, re-engaging in hobbies, spending time with family or friends, or learning a new hobby. It's not so important what you do as that you enjoy it and stick with it. Depression can start a vicious cycle where you are not doing a lot because you don't feel well, which leads to less things to be excited and happy about, and thus more depression and behavioral avoidance. It can be hard to change this pattern once it has started but most people find that they feel better when they start doing more even if they don't enjoy it at first.   Avoid overfocusing on cognitive performance. Memory and cognition are notoriously fallible and if you are looking for cognitive problems, you are bound to find them. Once someone gets worried about their memory and thinking, they may overfocus on how they are doing day-to-day, and then when normal day-to-day cognitive errors are made, this becomes a cause for more concern. This concern and anxiety then decreases focus from the task at hand, reducing concentration, causing more cognitive problems, and  creating a vicious cycle. Rather than critiquing your performance, I would encourage you to remain present minded and focus on the task at hand. Perhaps most importantly, have reasonable expectations for yourself.  Healthy people forget things, lose focus, and do not perform 100% correctly all the time. Some cognitive errors are normal and are not necessarily a sign that there is something wrong with your brain.   Countless studies have found a link between diabetes (particularly midlife diabetes) and dementia, with general estimates of about 1.5 - 2 times the general population risk of developing dementia for diabetic individuals. Individuals with poorly controlled diabetes in midlife are at particularly high risk with those having an HbA1c ? 6.5% at about a 3-fold risk and those with an HbA1c ? 7% at a 5-fold risk of developing dementia as compared with the general population. Work diligently to control your diabetes. Many of the healthy lifestyle changes that can help manage blood sugar (things like exercise and diet) have also been shown to have beneficial effects on brain health. You can work on lifestyle changes for your diabetes and if you are not sure where to start, consider diabetes education, nutritional consultation, and/or personal training (your PCP may be a good place to start for recommendations).   Of course, if your issues worsen or fail to improve, you can return for reevaluation but I would like to reassure you that the likelihood this is related to  a condition at risk for decline such as Alzheimer's disease is extremely low.   Test Findings  Test scores are summarized in additional documentation associated with this encounter. Test scores are relative to age, gender, and educational history as available and appropriate. There were no concerns about performance validity as all findings fell within normal expectations. The patient did present as quite low energy and seemed to fatigue easily  during testing as per technician, which may have reduced her performance on certain tests.   General Intellectual Functioning/Achievement:  Performance on single word reading was low average, whereas her performance on the RIST index was average. Reading performance may relate to education quality and or preexisting strengths and weaknesses. Low average to average present as reasonable standards of comparison for this patient's cognitive test performance.   Attention and Processing Efficiency: Performance on indicators of attention and working memory was weak but not frankly impaired, with a couple low scores yet a reasonable low average performance level on the overall index. Digit repetition forward was average on two different indicators whereas digit repetition backward was unusually low and digit resequencing in ascending order was low average. Mental solving of arithmetical word problems without paper and pencil was unusually low (she commented she had difficulty in certain math classes and this may reflex preexisting difficulties).   With respect to processing efficiency, overall performance was at the margin of the low average to average ranges on the [facility]. Performance was average on a symbol-matching to sample task emphasizing efficient visual matching and efficient visual scanning. By contrast, timed number-symbol coding was low average on one measure and unusually low on a different measures. Simple numeric sequencing was average.   Language: Performance on language measures was good with low average visual object confrontation naming performance. Generation of words in response to the letters F-A-S was high average (almost superior), whereas animal fluency was average.   Visuospatial Function: Performance on indicators of visuospatial abilities showed good performance, with a score at the uppermost aspect of the average range on the overall index. Figure copy was errorless and in the  superior range, whereas judgment of angular line orientations was average.   Learning and Memory: Performance on measures of learning and memory showed scattered low scores, although I do not think the findings represent "frank impairment" and rather, would characterize them as involving difficulties with performance consistency and cognitive efficiency factors that are likely on the basis of executive control problems.   In the verbal realm, [name] learned 3, 4, 5, and 7 words of a 10-item word list across four learning trials, which is extremely low. She did better on delayed recall, however, and scored at an average level. Recognition for the information contained amongst distractor alternate items was extremely low, a finding of uncertain significance given preserved delayed free recall. Memory for a short story was similar but a bit more robust with unusually low immediate recall and then average long delayed free recall.   In the visual realm, delayed recall for a modestly complex geometric figure was very good, at the highest extreme of the average range.   Executive Functions: Performance on executive measures was good with an average Therapist, music on the [employer], average performance on alternating sequencing of numbers and letters, and high average (nearly superior) generation of words in response to given categories.   Rating Scale(s): [name] reported moderately severe levels of depressive symptoms including subjective symptoms of depression and a moderate  level of anxiety symptoms.   Viviano Simas Nicole Kindred, PsyD, ABN Clinical Neuropsychologist  Coding and Compliance  Billing below reflects technician time, my direct face-to-face time with the patient, time spent in test administration, and time spent in professional activities including but not limited to: neuropsychological test interpretation, integration of neuropsychological test  data with clinical history, report preparation, treatment planning, care coordination, and review of diagnostically pertinent medical history or studies.   Services associated with this encounter: Clinical Interview [phone]) plus 160 minutes ([id number]; Neuropsychological Evaluation by Professional)  120 minutes ([id number]; Neuropsychological Testing by Technician)

## 2020-06-21 ENCOUNTER — Encounter: Payer: Self-pay | Admitting: Counselor

## 2020-06-21 ENCOUNTER — Other Ambulatory Visit: Payer: Self-pay

## 2020-06-21 ENCOUNTER — Ambulatory Visit (INDEPENDENT_AMBULATORY_CARE_PROVIDER_SITE_OTHER): Payer: BC Managed Care – PPO | Admitting: Counselor

## 2020-06-21 DIAGNOSIS — F339 Major depressive disorder, recurrent, unspecified: Secondary | ICD-10-CM | POA: Diagnosis not present

## 2020-06-21 NOTE — Patient Instructions (Signed)
Your performance and presentation on assessment were consistent with some scattered areas of low scores; however, you also had reasonable scores in many of the areas that you had difficulty with and you did well on several challenging measures. Accordingly, I do not think that "impairment" is the best term for your problems; you have some mild difficulties with cognitive efficiency and executive control that are likely interfering with performance consistency. The good news is that these are the most common type of difficulty I see in my patients when there is NOT an underlying neurodegenerative cause such as dementia responsible, and they often respond well to treatment.   In terms of the specific nature of your difficulties, I think your cognitive problems are best summed up as involving difficulties with executive control. Executive control is a higher order cognitive ability involved in regulating other cognitive resources. Much like the conductor of an orchestra coordinates multiple instruments to make music, executive capacities coordinate other lower-order skills (e.g., movement, language, attention) to form complex human behaviors. Individuals with executive control problems are often capable of doing most of the things they did before they were having problems, but they may not do so as effortlessly, efficiently, and consistently. These difficulties often manifest as problems tracking information, multitasking, and paying attention. Executive control problems often result in cognitive inefficiency and can present as "memory problems," because they decrease encoding and spontaneous retrieval of information.   These difficulties are likely due to your emotional concerns, insufficient sleep, low energy/subjective poor health, and there may also be some confound from poorly controlled diabetes, which can affect cognition by reducing stamina, contributing to fatigue, and other mechanisms. Once you get  these things under control I think you will be feeling better cognitively.   There are few things as disruptive to brain functioning as not getting a good night's sleep. For sleep, I recommend against using medications, which can have lingering sedating effects on the brain and rob your brain of restful REM sleep. Instead, consider trying some of the following sleep hygiene recommendations. They may not work at once and may take effort, but the effort you spend is likely to be rewarded with better sleep eventually:   Stick to a sleep schedule of the same bedtime and wake time even on the weekends, which can help to regulate your body's internal clock so that you fall asleep and stay asleep.   Practice a relaxing bedtime ritual (conducted away from bright lights) which will help separate your sleep from stimulating activities and prepare your body to fall asleep when you go to bed.   Avoid naps, especially in the afternoon.   Evaluate your room and create conditions that will promote sleep such as keeping it cool (between 60 - 67 degrees), quiet, and free from any lights. Consider using blackout curtains, a "white noise" generator, or fan that will help mask any noises that might prevent you from going to sleep or awaken you during the night.   Sleep on a comfortable mattress and pillows.   Avoid bright light in the evening and excessive use of portable electronic devices right before bed that may contain light frequencies that can contribute to sleep problems.   Avoid alcohol, cigarettes, or heavy meals in the evening. If you must eat, consume a light snack 45 minutes before bed.   Use your bed only for sleep to strengthen the association between your bed and sleep.   If you can't go to sleep within 30 minutes, go  into another room and do something relaxing until you feel tired. Then, come back and try to go to sleep again for 30 minutes and repeat until sleep is achieved.   Some people find  over the counter melatonin to be helpful for sleep, which you could discuss with a pharmacist or prescribing provider.   Depression can affect cognitive functioning in several ways. For one, there are neurobiological changes in depression that can contribute to attention, concentration, and memory problems. These changes are so common they are part of the criteria we use to diagnose depressive disorders. Depression can also negatively impact your own appraisal of your cognitive abilities, leading you to feel like you are performing more poorly than is objectively warranted. In your case, you scored at a high level for anxiety and depressive symptoms, which often go together. I think you should ask your PCP about medication and you may also benefit from counseling.   There is a significant research base and evidence of effectiveness for something called "behavioral activation," which is a fancy way of saying that you should increase your activity level. In general, people do not feel as happy or do as well when they are not doing much. This can include things like getting out for walks, re-engaging in hobbies, spending time with family or friends, or learning a new hobby. It's not so important what you do as that you enjoy it and stick with it. Depression can start a vicious cycle where you are not doing a lot because you don't feel well, which leads to less things to be excited and happy about, and thus more depression and behavioral avoidance. It can be hard to change this pattern once it has started but most people find that they feel better when they start doing more even if they don't enjoy it at first.   Avoid overfocusing on cognitive performance. Memory and cognition are notoriously fallible and if you are looking for cognitive problems, you are bound to find them. Once someone gets worried about their memory and thinking, they may overfocus on how they are doing day-to-day, and then when normal  day-to-day cognitive errors are made, this becomes a cause for more concern. This concern and anxiety then decreases focus from the task at hand, reducing concentration, causing more cognitive problems, and creating a vicious cycle. Rather than critiquing your performance, I would encourage you to remain present minded and focus on the task at hand. Perhaps most importantly, have reasonable expectations for yourself.  Healthy people forget things, lose focus, and do not perform 100% correctly all the time. Some cognitive errors are normal and are not necessarily a sign that there is something wrong with your brain.   Countless studies have found a link between diabetes (particularly midlife diabetes) and dementia, with general estimates of about 1.5 - 2 times the general population risk of developing dementia for diabetic individuals. Individuals with poorly controlled diabetes in midlife are at particularly high risk with those having an HbA1c ? 6.5% at about a 3-fold risk and those with an HbA1c ? 7% at a 5-fold risk of developing dementia as compared with the general population. Work diligently to control your diabetes. Many of the healthy lifestyle changes that can help manage blood sugar (things like exercise and diet) have also been shown to have beneficial effects on brain health. You can work on lifestyle changes for your diabetes and if you are not sure where to start, consider diabetes education, nutritional consultation, and/or  personal training (your PCP may be a good place to start for recommendations).   Of course, if your issues worsen or fail to improve, you can return for reevaluation but I would like to reassure you that the likelihood this is related to a condition at risk for decline such as Alzheimer's disease is extremely low.

## 2020-06-21 NOTE — Progress Notes (Signed)
NEUROPSYCHOLOGY FEEDBACK NOTE [facility]  Feedback Note: I met with [name] to review the findings resulting from her neuropsychological evaluation. Since the last appointment, she has been about the same. Unfortunately, her father was found unresponsive and has since transitioned to skilled nursing. He has a tumor that was discovered in 2017 and also had a stroke at that time. She seems to be handling it in stride. Time was spent reviewing the impressions and recommendations that are detailed in the evaluation report. We discussed impression of decent cognitive test performance, albeit with some scattered areas of weakness suggestive of executive control problems. I was candid with her that the likelihood of this being an underlying neurological condition is low, I think it is more likely due to reversible causes such as her currently active depression/anxiety, poor sleep, poorly controlled diabetes, distraction related to pain and the like. Other topics of discussion as reflected in the patient instructions. I took time to explain the findings and answer all the patient's questions. I encouraged [name] to contact me should she have any further questions or if further follow up is desired.   Current Medications and Medical History   Current Outpatient Medications  Medication Sig Dispense Refill  . aspirin 81 MG tablet Take 81 mg by mouth daily.    . cephALEXin (KEFLEX) 500 MG capsule Take 1 capsule (500 mg total) by mouth 4 (four) times daily. (Patient not taking: Reported on 04/30/2020) 6 capsule 0  . DULoxetine (CYMBALTA) 60 MG capsule Take 60 mg by mouth daily. (Patient not taking: Reported on 04/30/2020)  0  . Empagliflozin (JARDIANCE PO) Take by mouth.    Eduard Roux (AIMOVIG) 70 MG/ML SOAJ Inject into the skin.    Marland Kitchen glucose blood (ONETOUCH VERIO) test strip Use as instructed 50 each 2  . INVOKANA 100 MG TABS tablet TAKE 1 TABLET BY MOUTH BEFORE BREAKFAST  (Patient not taking: Reported on 04/30/2020) 30 tablet 3  . IRON PO Take by mouth. States she takes once a day    . lisinopril (PRINIVIL,ZESTRIL) 10 MG tablet TAKE 1 TABLET (10 MG TOTAL) BY MOUTH DAILY. 30 tablet 0  . metFORMIN (GLUCOPHAGE-XR) 500 MG 24 hr tablet TAKE 4 TABLETS BY MOUTH DAILY WITH SUPPER. 120 tablet 3  . naproxen (NAPROSYN) 500 MG tablet Take 1 tablet (500 mg total) by mouth every 12 (twelve) hours as needed for mild pain. 10 tablet 0  . pravastatin (PRAVACHOL) 40 MG tablet Take 1 tablet (40 mg total) by mouth daily. 90 tablet 3  . VICTOZA 18 MG/3ML SOPN INJECT 1.8 MG AS DIRECTED DAILY (Patient not taking: Reported on 04/30/2020) 27 mL 2  . vitamin B-12 (CYANOCOBALAMIN) 1000 MCG tablet Take 1 tablet (1,000 mcg total) by mouth daily. 30 tablet 0  . Vitamin D, Ergocalciferol, (DRISDOL) 50000 units CAPS capsule Take 50,000 Units by mouth once a week.  3   No current facility-administered medications for this visit.    Patient Active Problem List   Diagnosis Date Noted  . Urinary tract obstruction due to kidney stone 05/21/2017  . Kidney stone on left side   . Pyelonephritis   . Depression 05/02/2013  . HTN (hypertension) 05/02/2013  . Type 2 diabetes mellitus ([place]) 12/28/2012  . HYPERCHOLESTEROLEMIA 05/27/2006  . Obesity 05/27/2006  . Migraine 05/27/2006  . Sleep apnea 05/27/2006    Mental Status and Behavioral Observations  [name] presented on time to the present encounter and was alert and generally oriented.  Speech was normal in rate, rhythm, volume, and prosody. Self-reported mood was "allright" and affect was dysphoric with moments of tearfulness. Thought process was logical and goal oriented and thought content was appropriate to the topics discussed. There were no safety concerns identified at today's encounter, such as thoughts of harming self or others.   Plan  Feedback provided regarding the patient's neuropsychological evaluation. She knows she can  return for reevaluation in 1 to 2 years if she feels that her issues are worsening, although I suspect they will in fact improve with treatment of her underlying affective and other issues. Suggested that she seek psychotherapy, which she will work on, and she will follow up with Emily Drivers, PA regarding antidepressants (has a script for cymbalta but is not taking it). [name] was encouraged to contact me if any questions arise or if further follow up is desired.   Emily Simas Nicole Kindred, [name], ABN Clinical Neuropsychologist  Service(s) Provided at This Encounter: 30 minutes [phone]; Psychotherapy with patient/family)

## 2020-07-25 ENCOUNTER — Other Ambulatory Visit (HOSPITAL_COMMUNITY): Payer: Self-pay

## 2020-07-25 DIAGNOSIS — R079 Chest pain, unspecified: Secondary | ICD-10-CM | POA: Diagnosis not present

## 2020-07-25 DIAGNOSIS — R112 Nausea with vomiting, unspecified: Secondary | ICD-10-CM | POA: Diagnosis not present

## 2020-07-25 DIAGNOSIS — M940 Chondrocostal junction syndrome [Tietze]: Secondary | ICD-10-CM | POA: Diagnosis not present

## 2020-07-25 DIAGNOSIS — E1169 Type 2 diabetes mellitus with other specified complication: Secondary | ICD-10-CM | POA: Diagnosis not present

## 2020-07-25 DIAGNOSIS — E010 Iodine-deficiency related diffuse (endemic) goiter: Secondary | ICD-10-CM | POA: Diagnosis not present

## 2020-07-25 MED ORDER — ONDANSETRON 8 MG PO TBDP
ORAL_TABLET | ORAL | 0 refills | Status: AC
  Filled 2020-07-25: qty 15, 5d supply, fill #0

## 2020-07-25 MED ORDER — ONETOUCH VERIO IQ SYSTEM W/DEVICE KIT
PACK | 0 refills | Status: AC
  Filled 2020-07-25: qty 1, 30d supply, fill #0

## 2020-07-25 MED ORDER — ONETOUCH VERIO VI STRP
ORAL_STRIP | 3 refills | Status: DC
  Filled 2020-07-25 – 2020-08-01 (×2): qty 100, 90d supply, fill #0

## 2020-07-25 MED ORDER — ONETOUCH DELICA PLUS LANCET33G MISC
3 refills | Status: AC
  Filled 2020-07-25: qty 100, 90d supply, fill #0

## 2020-07-26 ENCOUNTER — Other Ambulatory Visit (HOSPITAL_COMMUNITY): Payer: Self-pay

## 2020-07-26 ENCOUNTER — Other Ambulatory Visit: Payer: Self-pay | Admitting: Family Medicine

## 2020-07-26 DIAGNOSIS — E010 Iodine-deficiency related diffuse (endemic) goiter: Secondary | ICD-10-CM

## 2020-07-27 ENCOUNTER — Other Ambulatory Visit (HOSPITAL_COMMUNITY): Payer: Self-pay

## 2020-07-29 ENCOUNTER — Other Ambulatory Visit (HOSPITAL_COMMUNITY): Payer: Self-pay

## 2020-07-30 ENCOUNTER — Other Ambulatory Visit (HOSPITAL_COMMUNITY): Payer: Self-pay

## 2020-07-30 MED ORDER — ONETOUCH VERIO VI STRP
ORAL_STRIP | 2 refills | Status: AC
  Filled 2020-07-30 – 2020-08-13 (×2): qty 100, 90d supply, fill #0

## 2020-07-30 MED FILL — Metformin HCl Tab 500 MG: ORAL | 90 days supply | Qty: 180 | Fill #0 | Status: AC

## 2020-07-30 MED FILL — Duloxetine HCl Enteric Coated Pellets Cap 30 MG (Base Eq): ORAL | 90 days supply | Qty: 180 | Fill #0 | Status: AC

## 2020-07-30 MED FILL — Glimepiride Tab 4 MG: ORAL | 90 days supply | Qty: 180 | Fill #0 | Status: AC

## 2020-07-30 MED FILL — Lisinopril Tab 10 MG: ORAL | 90 days supply | Qty: 90 | Fill #0 | Status: AC

## 2020-07-31 ENCOUNTER — Other Ambulatory Visit (HOSPITAL_COMMUNITY): Payer: Self-pay

## 2020-07-31 MED ORDER — BUPROPION HCL ER (XL) 150 MG PO TB24
ORAL_TABLET | ORAL | 0 refills | Status: DC
  Filled 2020-07-31: qty 90, 90d supply, fill #0

## 2020-08-01 ENCOUNTER — Other Ambulatory Visit (HOSPITAL_COMMUNITY): Payer: Self-pay

## 2020-08-01 ENCOUNTER — Ambulatory Visit
Admission: RE | Admit: 2020-08-01 | Discharge: 2020-08-01 | Disposition: A | Discharge status: Home / Self Care | Payer: BC Managed Care – PPO | Source: Ambulatory Visit | Attending: Family Medicine | Admitting: Family Medicine

## 2020-08-01 DIAGNOSIS — E010 Iodine-deficiency related diffuse (endemic) goiter: Secondary | ICD-10-CM

## 2020-08-01 DIAGNOSIS — E041 Nontoxic single thyroid nodule: Secondary | ICD-10-CM | POA: Diagnosis not present

## 2020-08-02 ENCOUNTER — Other Ambulatory Visit (HOSPITAL_COMMUNITY): Payer: Self-pay

## 2020-08-02 DIAGNOSIS — Z1231 Encounter for screening mammogram for malignant neoplasm of breast: Secondary | ICD-10-CM | POA: Diagnosis not present

## 2020-08-05 ENCOUNTER — Other Ambulatory Visit (HOSPITAL_COMMUNITY): Payer: Self-pay

## 2020-08-05 ENCOUNTER — Other Ambulatory Visit: Payer: Self-pay | Admitting: Family Medicine

## 2020-08-05 DIAGNOSIS — R110 Nausea: Secondary | ICD-10-CM

## 2020-08-05 DIAGNOSIS — R748 Abnormal levels of other serum enzymes: Secondary | ICD-10-CM

## 2020-08-05 DIAGNOSIS — R1084 Generalized abdominal pain: Secondary | ICD-10-CM

## 2020-08-06 ENCOUNTER — Other Ambulatory Visit (HOSPITAL_COMMUNITY): Payer: Self-pay

## 2020-08-06 DIAGNOSIS — E042 Nontoxic multinodular goiter: Secondary | ICD-10-CM | POA: Diagnosis not present

## 2020-08-06 DIAGNOSIS — G43709 Chronic migraine without aura, not intractable, without status migrainosus: Secondary | ICD-10-CM | POA: Diagnosis not present

## 2020-08-06 DIAGNOSIS — E1169 Type 2 diabetes mellitus with other specified complication: Secondary | ICD-10-CM | POA: Diagnosis not present

## 2020-08-06 DIAGNOSIS — E785 Hyperlipidemia, unspecified: Secondary | ICD-10-CM | POA: Diagnosis not present

## 2020-08-06 DIAGNOSIS — E559 Vitamin D deficiency, unspecified: Secondary | ICD-10-CM | POA: Diagnosis not present

## 2020-08-06 DIAGNOSIS — I10 Essential (primary) hypertension: Secondary | ICD-10-CM | POA: Diagnosis not present

## 2020-08-06 MED ORDER — LISINOPRIL 10 MG PO TABS
ORAL_TABLET | ORAL | 1 refills | Status: DC
  Filled 2020-08-06 – 2020-10-29 (×2): qty 90, 90d supply, fill #0

## 2020-08-06 MED ORDER — GLIMEPIRIDE 4 MG PO TABS
ORAL_TABLET | ORAL | 1 refills | Status: DC
  Filled 2020-08-06 – 2021-01-15 (×2): qty 180, 90d supply, fill #0
  Filled 2021-05-21: qty 180, 90d supply, fill #1

## 2020-08-06 MED ORDER — MELOXICAM 15 MG PO TABS
ORAL_TABLET | ORAL | 1 refills | Status: DC
  Filled 2020-08-06: qty 90, 90d supply, fill #0
  Filled 2020-10-29: qty 90, 90d supply, fill #1

## 2020-08-06 MED ORDER — METFORMIN HCL 500 MG PO TABS
ORAL_TABLET | ORAL | 1 refills | Status: DC
  Filled 2020-08-06 – 2020-11-21 (×2): qty 180, 90d supply, fill #0
  Filled 2021-05-21: qty 180, 90d supply, fill #1

## 2020-08-06 MED ORDER — DULOXETINE HCL 30 MG PO CPEP
ORAL_CAPSULE | ORAL | 1 refills | Status: DC
  Filled 2020-08-06 – 2020-11-21 (×2): qty 180, 90d supply, fill #0
  Filled 2021-05-21: qty 180, 90d supply, fill #1

## 2020-08-06 MED ORDER — AIMOVIG 70 MG/ML ~~LOC~~ SOAJ
SUBCUTANEOUS | 3 refills | Status: AC
  Filled 2020-08-06: qty 3, 90d supply, fill #0

## 2020-08-06 MED ORDER — JARDIANCE 25 MG PO TABS
ORAL_TABLET | ORAL | 1 refills | Status: DC
  Filled 2020-08-06: qty 90, 90d supply, fill #0
  Filled 2020-10-29: qty 90, 90d supply, fill #1

## 2020-08-06 MED ORDER — ONETOUCH VERIO VI STRP
ORAL_STRIP | 3 refills | Status: AC
  Filled 2020-08-06: qty 100, 30d supply, fill #0

## 2020-08-06 MED ORDER — BUPROPION HCL ER (XL) 150 MG PO TB24
ORAL_TABLET | ORAL | 1 refills | Status: DC
  Filled 2020-08-06 – 2020-11-21 (×2): qty 90, 90d supply, fill #0

## 2020-08-06 MED ORDER — ROSUVASTATIN CALCIUM 20 MG PO TABS
ORAL_TABLET | ORAL | 1 refills | Status: DC
  Filled 2020-08-06: qty 90, 90d supply, fill #0
  Filled 2020-11-21: qty 90, 90d supply, fill #1

## 2020-08-07 ENCOUNTER — Other Ambulatory Visit (HOSPITAL_COMMUNITY): Payer: Self-pay

## 2020-08-07 DIAGNOSIS — E1169 Type 2 diabetes mellitus with other specified complication: Secondary | ICD-10-CM | POA: Diagnosis not present

## 2020-08-08 ENCOUNTER — Other Ambulatory Visit (HOSPITAL_COMMUNITY): Payer: Self-pay

## 2020-08-13 ENCOUNTER — Other Ambulatory Visit (HOSPITAL_COMMUNITY): Payer: Self-pay

## 2020-08-14 ENCOUNTER — Other Ambulatory Visit (HOSPITAL_COMMUNITY): Payer: Self-pay

## 2020-08-14 MED ORDER — GLUCOSE BLOOD VI STRP
ORAL_STRIP | 0 refills | Status: AC
  Filled 2020-08-14: qty 100, 90d supply, fill #0

## 2020-08-14 MED ORDER — CONTOUR NEXT MONITOR W/DEVICE KIT
PACK | 0 refills | Status: AC
  Filled 2020-08-14: qty 1, 30d supply, fill #0

## 2020-08-14 MED ORDER — MICROLET LANCETS MISC
0 refills | Status: AC
  Filled 2020-08-14: qty 100, 90d supply, fill #0

## 2020-08-15 ENCOUNTER — Other Ambulatory Visit (HOSPITAL_COMMUNITY): Payer: Self-pay

## 2020-08-20 ENCOUNTER — Other Ambulatory Visit: Payer: BC Managed Care – PPO

## 2020-08-23 ENCOUNTER — Ambulatory Visit
Admission: RE | Admit: 2020-08-23 | Discharge: 2020-08-23 | Disposition: A | Discharge status: Home / Self Care | Payer: BC Managed Care – PPO | Source: Ambulatory Visit | Attending: Family Medicine | Admitting: Family Medicine

## 2020-08-23 ENCOUNTER — Other Ambulatory Visit: Payer: Self-pay

## 2020-08-23 DIAGNOSIS — Z9049 Acquired absence of other specified parts of digestive tract: Secondary | ICD-10-CM | POA: Diagnosis not present

## 2020-08-23 DIAGNOSIS — R110 Nausea: Secondary | ICD-10-CM

## 2020-08-23 DIAGNOSIS — R109 Unspecified abdominal pain: Secondary | ICD-10-CM | POA: Diagnosis not present

## 2020-08-23 DIAGNOSIS — R748 Abnormal levels of other serum enzymes: Secondary | ICD-10-CM

## 2020-08-23 DIAGNOSIS — Z90710 Acquired absence of both cervix and uterus: Secondary | ICD-10-CM | POA: Diagnosis not present

## 2020-08-23 DIAGNOSIS — R1084 Generalized abdominal pain: Secondary | ICD-10-CM

## 2020-08-23 MED ORDER — IOPAMIDOL (ISOVUE-300) INJECTION 61%
100.0000 mL | Freq: Once | INTRAVENOUS | Status: AC | PRN
  Administered 2020-08-23: 100 mL via INTRAVENOUS

## 2020-09-16 ENCOUNTER — Other Ambulatory Visit (HOSPITAL_COMMUNITY): Payer: Self-pay

## 2020-09-16 DIAGNOSIS — E1165 Type 2 diabetes mellitus with hyperglycemia: Secondary | ICD-10-CM | POA: Diagnosis not present

## 2020-09-16 DIAGNOSIS — E1169 Type 2 diabetes mellitus with other specified complication: Secondary | ICD-10-CM | POA: Diagnosis not present

## 2020-09-16 DIAGNOSIS — Z8632 Personal history of gestational diabetes: Secondary | ICD-10-CM | POA: Diagnosis not present

## 2020-09-16 DIAGNOSIS — R809 Proteinuria, unspecified: Secondary | ICD-10-CM | POA: Diagnosis not present

## 2020-09-16 MED ORDER — FREESTYLE LIBRE 2 READER DEVI
0 refills | Status: AC
  Filled 2020-09-16: qty 1, 90d supply, fill #0

## 2020-09-17 ENCOUNTER — Other Ambulatory Visit (HOSPITAL_COMMUNITY): Payer: Self-pay

## 2020-09-25 ENCOUNTER — Other Ambulatory Visit (HOSPITAL_COMMUNITY): Payer: Self-pay

## 2020-09-25 DIAGNOSIS — L308 Other specified dermatitis: Secondary | ICD-10-CM | POA: Diagnosis not present

## 2020-09-25 DIAGNOSIS — F331 Major depressive disorder, recurrent, moderate: Secondary | ICD-10-CM | POA: Diagnosis not present

## 2020-09-25 MED ORDER — TRIAMCINOLONE ACETONIDE 0.1 % EX OINT
TOPICAL_OINTMENT | Freq: Two times a day (BID) | CUTANEOUS | 0 refills | Status: DC
  Filled 2020-09-25: qty 80, 30d supply, fill #0

## 2020-10-01 ENCOUNTER — Other Ambulatory Visit (HOSPITAL_COMMUNITY): Payer: Self-pay

## 2020-10-01 DIAGNOSIS — E1165 Type 2 diabetes mellitus with hyperglycemia: Secondary | ICD-10-CM | POA: Diagnosis not present

## 2020-10-01 DIAGNOSIS — E1169 Type 2 diabetes mellitus with other specified complication: Secondary | ICD-10-CM | POA: Diagnosis not present

## 2020-10-01 DIAGNOSIS — Z8632 Personal history of gestational diabetes: Secondary | ICD-10-CM | POA: Diagnosis not present

## 2020-10-01 DIAGNOSIS — R809 Proteinuria, unspecified: Secondary | ICD-10-CM | POA: Diagnosis not present

## 2020-10-01 MED ORDER — OZEMPIC (0.25 OR 0.5 MG/DOSE) 2 MG/1.5ML ~~LOC~~ SOPN
PEN_INJECTOR | SUBCUTANEOUS | 2 refills | Status: DC
  Filled 2020-10-01: qty 1.5, 28d supply, fill #0
  Filled 2020-11-21: qty 1.5, 28d supply, fill #1
  Filled 2021-01-15: qty 1.5, 28d supply, fill #2

## 2020-10-01 MED ORDER — FREESTYLE LIBRE 2 SENSOR MISC
6 refills | Status: AC
  Filled 2020-10-01: qty 2, 28d supply, fill #0
  Filled 2020-11-05: qty 2, 28d supply, fill #1
  Filled 2020-11-21: qty 2, 28d supply, fill #2

## 2020-10-02 ENCOUNTER — Other Ambulatory Visit (HOSPITAL_COMMUNITY): Payer: Self-pay

## 2020-10-21 DIAGNOSIS — F331 Major depressive disorder, recurrent, moderate: Secondary | ICD-10-CM | POA: Diagnosis not present

## 2020-10-29 ENCOUNTER — Other Ambulatory Visit (HOSPITAL_COMMUNITY): Payer: Self-pay

## 2020-10-30 ENCOUNTER — Other Ambulatory Visit (HOSPITAL_COMMUNITY): Payer: Self-pay

## 2020-10-31 ENCOUNTER — Other Ambulatory Visit (HOSPITAL_COMMUNITY): Payer: Self-pay

## 2020-11-05 ENCOUNTER — Other Ambulatory Visit (HOSPITAL_COMMUNITY): Payer: Self-pay

## 2020-11-21 ENCOUNTER — Other Ambulatory Visit (HOSPITAL_COMMUNITY): Payer: Self-pay

## 2020-11-23 ENCOUNTER — Other Ambulatory Visit (HOSPITAL_COMMUNITY): Payer: Self-pay

## 2020-12-04 DIAGNOSIS — Z8632 Personal history of gestational diabetes: Secondary | ICD-10-CM | POA: Diagnosis not present

## 2020-12-04 DIAGNOSIS — E1169 Type 2 diabetes mellitus with other specified complication: Secondary | ICD-10-CM | POA: Diagnosis not present

## 2020-12-04 DIAGNOSIS — R809 Proteinuria, unspecified: Secondary | ICD-10-CM | POA: Diagnosis not present

## 2020-12-04 DIAGNOSIS — E1165 Type 2 diabetes mellitus with hyperglycemia: Secondary | ICD-10-CM | POA: Diagnosis not present

## 2021-01-15 ENCOUNTER — Other Ambulatory Visit (HOSPITAL_COMMUNITY): Payer: Self-pay

## 2021-01-16 ENCOUNTER — Other Ambulatory Visit (HOSPITAL_COMMUNITY): Payer: Self-pay

## 2021-01-16 MED ORDER — MELOXICAM 15 MG PO TABS
15.0000 mg | ORAL_TABLET | Freq: Every day | ORAL | 0 refills | Status: DC
  Filled 2021-01-16: qty 90, 90d supply, fill #0

## 2021-01-30 ENCOUNTER — Other Ambulatory Visit (HOSPITAL_COMMUNITY): Payer: Self-pay

## 2021-01-30 DIAGNOSIS — G43709 Chronic migraine without aura, not intractable, without status migrainosus: Secondary | ICD-10-CM | POA: Diagnosis not present

## 2021-01-30 DIAGNOSIS — E1169 Type 2 diabetes mellitus with other specified complication: Secondary | ICD-10-CM | POA: Diagnosis not present

## 2021-01-30 DIAGNOSIS — R809 Proteinuria, unspecified: Secondary | ICD-10-CM | POA: Diagnosis not present

## 2021-01-30 DIAGNOSIS — Z23 Encounter for immunization: Secondary | ICD-10-CM | POA: Diagnosis not present

## 2021-01-30 DIAGNOSIS — E559 Vitamin D deficiency, unspecified: Secondary | ICD-10-CM | POA: Diagnosis not present

## 2021-01-30 DIAGNOSIS — I10 Essential (primary) hypertension: Secondary | ICD-10-CM | POA: Diagnosis not present

## 2021-01-30 DIAGNOSIS — E785 Hyperlipidemia, unspecified: Secondary | ICD-10-CM | POA: Diagnosis not present

## 2021-01-30 DIAGNOSIS — Z0000 Encounter for general adult medical examination without abnormal findings: Secondary | ICD-10-CM | POA: Diagnosis not present

## 2021-01-30 MED ORDER — GABAPENTIN 300 MG PO CAPS
ORAL_CAPSULE | ORAL | 5 refills | Status: DC
  Filled 2021-01-30: qty 90, 30d supply, fill #0

## 2021-01-30 MED ORDER — LISINOPRIL 10 MG PO TABS
ORAL_TABLET | ORAL | 1 refills | Status: DC
  Filled 2021-01-30: qty 90, 90d supply, fill #0

## 2021-01-30 MED ORDER — BUPROPION HCL ER (XL) 150 MG PO TB24
ORAL_TABLET | ORAL | 1 refills | Status: DC
  Filled 2021-01-30: qty 90, 90d supply, fill #0

## 2021-01-30 MED ORDER — ROSUVASTATIN CALCIUM 20 MG PO TABS
ORAL_TABLET | ORAL | 1 refills | Status: DC
  Filled 2021-02-26: qty 90, 90d supply, fill #0

## 2021-01-30 MED ORDER — DULOXETINE HCL 30 MG PO CPEP: ORAL_CAPSULE | ORAL | 1 refills | Status: DC

## 2021-01-30 MED ORDER — UBRELVY 100 MG PO TABS
ORAL_TABLET | ORAL | 1 refills | Status: AC
  Filled 2021-01-30: qty 9, 30d supply, fill #0

## 2021-01-31 ENCOUNTER — Other Ambulatory Visit (HOSPITAL_COMMUNITY): Payer: Self-pay

## 2021-02-03 ENCOUNTER — Other Ambulatory Visit (HOSPITAL_COMMUNITY): Payer: Self-pay

## 2021-02-04 ENCOUNTER — Other Ambulatory Visit (HOSPITAL_COMMUNITY): Payer: Self-pay

## 2021-02-06 DIAGNOSIS — F331 Major depressive disorder, recurrent, moderate: Secondary | ICD-10-CM | POA: Diagnosis not present

## 2021-02-26 ENCOUNTER — Other Ambulatory Visit (HOSPITAL_COMMUNITY): Payer: Self-pay

## 2021-02-26 MED ORDER — OZEMPIC (0.25 OR 0.5 MG/DOSE) 2 MG/1.5ML ~~LOC~~ SOPN
PEN_INJECTOR | SUBCUTANEOUS | 3 refills | Status: DC
Start: 2021-02-26 — End: 2023-07-27
  Filled 2021-02-26: qty 1.5, 28d supply, fill #0
  Filled 2021-05-07: qty 1.5, 28d supply, fill #1

## 2021-02-26 MED ORDER — JARDIANCE 25 MG PO TABS
25.0000 mg | ORAL_TABLET | Freq: Every day | ORAL | 1 refills | Status: DC
  Filled 2021-02-26: qty 90, 90d supply, fill #0
  Filled 2021-05-21: qty 90, 90d supply, fill #1

## 2021-03-19 DIAGNOSIS — E119 Type 2 diabetes mellitus without complications: Secondary | ICD-10-CM | POA: Diagnosis not present

## 2021-03-19 DIAGNOSIS — H2513 Age-related nuclear cataract, bilateral: Secondary | ICD-10-CM | POA: Diagnosis not present

## 2021-03-19 DIAGNOSIS — H04123 Dry eye syndrome of bilateral lacrimal glands: Secondary | ICD-10-CM | POA: Diagnosis not present

## 2021-03-19 DIAGNOSIS — H1045 Other chronic allergic conjunctivitis: Secondary | ICD-10-CM | POA: Diagnosis not present

## 2021-04-09 DIAGNOSIS — E049 Nontoxic goiter, unspecified: Secondary | ICD-10-CM | POA: Diagnosis not present

## 2021-04-09 DIAGNOSIS — R232 Flushing: Secondary | ICD-10-CM | POA: Diagnosis not present

## 2021-04-09 DIAGNOSIS — R112 Nausea with vomiting, unspecified: Secondary | ICD-10-CM | POA: Diagnosis not present

## 2021-05-07 ENCOUNTER — Other Ambulatory Visit (HOSPITAL_COMMUNITY): Payer: Self-pay

## 2021-05-19 DIAGNOSIS — M797 Fibromyalgia: Secondary | ICD-10-CM | POA: Diagnosis not present

## 2021-05-19 DIAGNOSIS — M79602 Pain in left arm: Secondary | ICD-10-CM | POA: Diagnosis not present

## 2021-05-21 ENCOUNTER — Other Ambulatory Visit (HOSPITAL_COMMUNITY): Payer: Self-pay

## 2021-06-30 DIAGNOSIS — M25512 Pain in left shoulder: Secondary | ICD-10-CM | POA: Diagnosis not present

## 2021-07-21 DIAGNOSIS — M25512 Pain in left shoulder: Secondary | ICD-10-CM | POA: Diagnosis not present

## 2021-07-31 DIAGNOSIS — E1169 Type 2 diabetes mellitus with other specified complication: Secondary | ICD-10-CM | POA: Diagnosis not present

## 2021-07-31 DIAGNOSIS — M25512 Pain in left shoulder: Secondary | ICD-10-CM | POA: Diagnosis not present

## 2021-07-31 DIAGNOSIS — G43709 Chronic migraine without aura, not intractable, without status migrainosus: Secondary | ICD-10-CM | POA: Diagnosis not present

## 2021-07-31 DIAGNOSIS — I10 Essential (primary) hypertension: Secondary | ICD-10-CM | POA: Diagnosis not present

## 2021-07-31 DIAGNOSIS — E785 Hyperlipidemia, unspecified: Secondary | ICD-10-CM | POA: Diagnosis not present

## 2021-07-31 DIAGNOSIS — M7502 Adhesive capsulitis of left shoulder: Secondary | ICD-10-CM | POA: Diagnosis not present

## 2021-08-08 ENCOUNTER — Other Ambulatory Visit (HOSPITAL_COMMUNITY): Payer: Self-pay

## 2021-09-08 ENCOUNTER — Other Ambulatory Visit (HOSPITAL_COMMUNITY): Payer: Self-pay

## 2021-09-18 DIAGNOSIS — Z1231 Encounter for screening mammogram for malignant neoplasm of breast: Secondary | ICD-10-CM | POA: Diagnosis not present

## 2021-09-19 DIAGNOSIS — E1169 Type 2 diabetes mellitus with other specified complication: Secondary | ICD-10-CM | POA: Diagnosis not present

## 2021-09-19 DIAGNOSIS — M7502 Adhesive capsulitis of left shoulder: Secondary | ICD-10-CM | POA: Diagnosis not present

## 2021-09-19 DIAGNOSIS — F321 Major depressive disorder, single episode, moderate: Secondary | ICD-10-CM | POA: Diagnosis not present

## 2021-11-27 ENCOUNTER — Ambulatory Visit (HOSPITAL_COMMUNITY): Payer: Self-pay | Admitting: Psychiatry

## 2021-12-17 DIAGNOSIS — I10 Essential (primary) hypertension: Secondary | ICD-10-CM | POA: Diagnosis not present

## 2021-12-17 DIAGNOSIS — E1165 Type 2 diabetes mellitus with hyperglycemia: Secondary | ICD-10-CM | POA: Diagnosis not present

## 2021-12-17 DIAGNOSIS — E1169 Type 2 diabetes mellitus with other specified complication: Secondary | ICD-10-CM | POA: Diagnosis not present

## 2021-12-17 DIAGNOSIS — E785 Hyperlipidemia, unspecified: Secondary | ICD-10-CM | POA: Diagnosis not present

## 2021-12-31 ENCOUNTER — Ambulatory Visit (HOSPITAL_BASED_OUTPATIENT_CLINIC_OR_DEPARTMENT_OTHER): Payer: BC Managed Care – PPO | Admitting: Psychiatry

## 2021-12-31 ENCOUNTER — Encounter (HOSPITAL_COMMUNITY): Payer: Self-pay | Admitting: Psychiatry

## 2021-12-31 ENCOUNTER — Other Ambulatory Visit: Payer: Self-pay | Admitting: Home Modifications

## 2021-12-31 VITALS — Wt 153.0 lb

## 2021-12-31 DIAGNOSIS — F331 Major depressive disorder, recurrent, moderate: Secondary | ICD-10-CM

## 2021-12-31 DIAGNOSIS — Z8639 Personal history of other endocrine, nutritional and metabolic disease: Secondary | ICD-10-CM | POA: Diagnosis not present

## 2021-12-31 DIAGNOSIS — E1165 Type 2 diabetes mellitus with hyperglycemia: Secondary | ICD-10-CM | POA: Diagnosis not present

## 2021-12-31 DIAGNOSIS — F4310 Post-traumatic stress disorder, unspecified: Secondary | ICD-10-CM

## 2021-12-31 DIAGNOSIS — E785 Hyperlipidemia, unspecified: Secondary | ICD-10-CM | POA: Diagnosis not present

## 2021-12-31 DIAGNOSIS — I10 Essential (primary) hypertension: Secondary | ICD-10-CM | POA: Diagnosis not present

## 2021-12-31 MED ORDER — REXULTI 0.5 MG PO TABS: 0.5000 mg | ORAL_TABLET | Freq: Every day | ORAL | 1 refills | Status: DC
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
## 2022-01-14 ENCOUNTER — Ambulatory Visit
Admission: RE | Admit: 2022-01-14 | Discharge: 2022-01-14 | Disposition: A | Discharge status: Home / Self Care | Payer: BC Managed Care – PPO | Source: Ambulatory Visit | Attending: Home Modifications | Admitting: Home Modifications

## 2022-01-14 DIAGNOSIS — E049 Nontoxic goiter, unspecified: Secondary | ICD-10-CM | POA: Diagnosis not present

## 2022-01-14 DIAGNOSIS — Z8639 Personal history of other endocrine, nutritional and metabolic disease: Secondary | ICD-10-CM

## 2022-01-15 ENCOUNTER — Telehealth (HOSPITAL_COMMUNITY): Payer: Self-pay | Admitting: *Deleted

## 2022-01-15 NOTE — Telephone Encounter (Signed)
PA FOR REXULTI 0.5 MG TABS HAS BEEN SUBMITTED TO [employer] VIA COVER MY MEDS.  AWAITING DETERMINATION.

## 2022-01-16 ENCOUNTER — Telehealth (HOSPITAL_COMMUNITY): Payer: Self-pay | Admitting: *Deleted

## 2022-01-16 NOTE — Telephone Encounter (Signed)
Additional clinical information for Rexulti PA faxed to [facility] as requested by [employer].

## 2022-01-19 ENCOUNTER — Telehealth (HOSPITAL_COMMUNITY): Payer: Self-pay | Admitting: *Deleted

## 2022-01-19 ENCOUNTER — Other Ambulatory Visit (HOSPITAL_COMMUNITY): Payer: Self-pay | Admitting: *Deleted

## 2022-01-19 MED ORDER — ARIPIPRAZOLE 5 MG PO TABS: ORAL_TABLET | ORAL | 0 refills | Status: DC

## 2022-01-19 NOTE — Telephone Encounter (Signed)
Please call Abilify 5 mg half tablet for 1 week and then full tablet daily.

## 2022-01-19 NOTE — Telephone Encounter (Signed)
PA FOR REXULTI 0.5 MG TABLETS HAS BEEN DENIED.   Rationale being pt has not tried and failed formulary alternatives: Abillify, Risperdal, Latuda, Seroquel, Geodon, Zyprexa, Invega, or Clozaril.   Pt next visit is scheduled for 01/22/22.

## 2022-01-19 NOTE — Telephone Encounter (Signed)
Pt agrees to start Abilify 2.5 mg for one week, then increase to 5 mg (whole tablet). Med ed initiated. Pt encouraged to call with any questions or concerns. Pt agrees.

## 2022-01-22 ENCOUNTER — Telehealth (HOSPITAL_BASED_OUTPATIENT_CLINIC_OR_DEPARTMENT_OTHER): Payer: BC Managed Care – PPO | Admitting: Psychiatry

## 2022-01-22 ENCOUNTER — Encounter (HOSPITAL_COMMUNITY): Payer: Self-pay | Admitting: Psychiatry

## 2022-01-22 VITALS — Wt 153.0 lb

## 2022-01-22 DIAGNOSIS — F331 Major depressive disorder, recurrent, moderate: Secondary | ICD-10-CM

## 2022-01-22 DIAGNOSIS — F4310 Post-traumatic stress disorder, unspecified: Secondary | ICD-10-CM | POA: Diagnosis not present
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
## 2022-02-24 ENCOUNTER — Telehealth (HOSPITAL_COMMUNITY): Payer: BC Managed Care – PPO | Admitting: Psychiatry

## 2022-04-03 DIAGNOSIS — E559 Vitamin D deficiency, unspecified: Secondary | ICD-10-CM | POA: Diagnosis not present

## 2022-04-03 DIAGNOSIS — I10 Essential (primary) hypertension: Secondary | ICD-10-CM | POA: Diagnosis not present

## 2022-04-03 DIAGNOSIS — E1169 Type 2 diabetes mellitus with other specified complication: Secondary | ICD-10-CM | POA: Diagnosis not present

## 2022-04-03 DIAGNOSIS — Z0000 Encounter for general adult medical examination without abnormal findings: Secondary | ICD-10-CM | POA: Diagnosis not present

## 2022-04-03 DIAGNOSIS — E785 Hyperlipidemia, unspecified: Secondary | ICD-10-CM | POA: Diagnosis not present

## 2022-04-20 DIAGNOSIS — M7502 Adhesive capsulitis of left shoulder: Secondary | ICD-10-CM | POA: Diagnosis not present

## 2022-05-04 DIAGNOSIS — E785 Hyperlipidemia, unspecified: Secondary | ICD-10-CM | POA: Diagnosis not present

## 2022-05-04 DIAGNOSIS — E1165 Type 2 diabetes mellitus with hyperglycemia: Secondary | ICD-10-CM | POA: Diagnosis not present

## 2022-05-04 DIAGNOSIS — I10 Essential (primary) hypertension: Secondary | ICD-10-CM | POA: Diagnosis not present

## 2022-05-04 DIAGNOSIS — Z8639 Personal history of other endocrine, nutritional and metabolic disease: Secondary | ICD-10-CM | POA: Diagnosis not present

## 2022-05-06 DIAGNOSIS — Z79899 Other long term (current) drug therapy: Secondary | ICD-10-CM | POA: Diagnosis not present

## 2022-05-06 DIAGNOSIS — Z049 Encounter for examination and observation for unspecified reason: Secondary | ICD-10-CM | POA: Diagnosis not present

## 2022-05-06 DIAGNOSIS — G43719 Chronic migraine without aura, intractable, without status migrainosus: Secondary | ICD-10-CM | POA: Diagnosis not present

## 2022-06-01 DIAGNOSIS — E1165 Type 2 diabetes mellitus with hyperglycemia: Secondary | ICD-10-CM | POA: Diagnosis not present

## 2022-06-01 DIAGNOSIS — I10 Essential (primary) hypertension: Secondary | ICD-10-CM | POA: Diagnosis not present

## 2022-06-01 DIAGNOSIS — E785 Hyperlipidemia, unspecified: Secondary | ICD-10-CM | POA: Diagnosis not present

## 2022-06-01 DIAGNOSIS — Z8639 Personal history of other endocrine, nutritional and metabolic disease: Secondary | ICD-10-CM | POA: Diagnosis not present

## 2022-06-03 DIAGNOSIS — H2513 Age-related nuclear cataract, bilateral: Secondary | ICD-10-CM | POA: Diagnosis not present

## 2022-06-03 DIAGNOSIS — H1045 Other chronic allergic conjunctivitis: Secondary | ICD-10-CM | POA: Diagnosis not present

## 2022-06-03 DIAGNOSIS — E119 Type 2 diabetes mellitus without complications: Secondary | ICD-10-CM | POA: Diagnosis not present

## 2022-06-03 DIAGNOSIS — H04123 Dry eye syndrome of bilateral lacrimal glands: Secondary | ICD-10-CM | POA: Diagnosis not present

## 2022-08-04 DIAGNOSIS — Z8639 Personal history of other endocrine, nutritional and metabolic disease: Secondary | ICD-10-CM | POA: Diagnosis not present

## 2022-08-04 DIAGNOSIS — E1165 Type 2 diabetes mellitus with hyperglycemia: Secondary | ICD-10-CM | POA: Diagnosis not present

## 2022-08-04 DIAGNOSIS — I10 Essential (primary) hypertension: Secondary | ICD-10-CM | POA: Diagnosis not present

## 2022-08-04 DIAGNOSIS — E785 Hyperlipidemia, unspecified: Secondary | ICD-10-CM | POA: Diagnosis not present

## 2022-08-06 DIAGNOSIS — M7502 Adhesive capsulitis of left shoulder: Secondary | ICD-10-CM | POA: Diagnosis not present

## 2022-08-13 DIAGNOSIS — F332 Major depressive disorder, recurrent severe without psychotic features: Secondary | ICD-10-CM | POA: Diagnosis not present

## 2022-08-20 DIAGNOSIS — F332 Major depressive disorder, recurrent severe without psychotic features: Secondary | ICD-10-CM | POA: Diagnosis not present

## 2022-08-21 ENCOUNTER — Other Ambulatory Visit: Payer: Self-pay

## 2022-08-21 ENCOUNTER — Inpatient Hospital Stay (HOSPITAL_COMMUNITY)
Admission: EM | Admit: 2022-08-21 | Discharge: 2022-08-23 | DRG: 638 | Disposition: A | Discharge status: Home / Self Care | Payer: BC Managed Care – PPO | Attending: Internal Medicine | Admitting: Internal Medicine

## 2022-08-21 ENCOUNTER — Emergency Department (HOSPITAL_COMMUNITY): Payer: BC Managed Care – PPO

## 2022-08-21 ENCOUNTER — Encounter (HOSPITAL_COMMUNITY): Payer: Self-pay

## 2022-08-21 DIAGNOSIS — E669 Obesity, unspecified: Secondary | ICD-10-CM | POA: Diagnosis not present

## 2022-08-21 DIAGNOSIS — Z90710 Acquired absence of both cervix and uterus: Secondary | ICD-10-CM | POA: Diagnosis not present

## 2022-08-21 DIAGNOSIS — F39 Unspecified mood [affective] disorder: Secondary | ICD-10-CM | POA: Diagnosis present

## 2022-08-21 DIAGNOSIS — G43909 Migraine, unspecified, not intractable, without status migrainosus: Secondary | ICD-10-CM | POA: Diagnosis present

## 2022-08-21 DIAGNOSIS — E119 Type 2 diabetes mellitus without complications: Secondary | ICD-10-CM

## 2022-08-21 DIAGNOSIS — Z803 Family history of malignant neoplasm of breast: Secondary | ICD-10-CM

## 2022-08-21 DIAGNOSIS — R531 Weakness: Secondary | ICD-10-CM | POA: Diagnosis not present

## 2022-08-21 DIAGNOSIS — I10 Essential (primary) hypertension: Secondary | ICD-10-CM | POA: Diagnosis not present

## 2022-08-21 DIAGNOSIS — Z818 Family history of other mental and behavioral disorders: Secondary | ICD-10-CM | POA: Diagnosis not present

## 2022-08-21 DIAGNOSIS — E7800 Pure hypercholesterolemia, unspecified: Secondary | ICD-10-CM | POA: Diagnosis not present

## 2022-08-21 DIAGNOSIS — Z833 Family history of diabetes mellitus: Secondary | ICD-10-CM

## 2022-08-21 DIAGNOSIS — Z87442 Personal history of urinary calculi: Secondary | ICD-10-CM

## 2022-08-21 DIAGNOSIS — D72829 Elevated white blood cell count, unspecified: Secondary | ICD-10-CM | POA: Diagnosis present

## 2022-08-21 DIAGNOSIS — Z9049 Acquired absence of other specified parts of digestive tract: Secondary | ICD-10-CM | POA: Diagnosis not present

## 2022-08-21 DIAGNOSIS — G43009 Migraine without aura, not intractable, without status migrainosus: Secondary | ICD-10-CM

## 2022-08-21 DIAGNOSIS — F32A Depression, unspecified: Secondary | ICD-10-CM | POA: Diagnosis present

## 2022-08-21 DIAGNOSIS — R1111 Vomiting without nausea: Secondary | ICD-10-CM | POA: Diagnosis not present

## 2022-08-21 DIAGNOSIS — Z7982 Long term (current) use of aspirin: Secondary | ICD-10-CM | POA: Diagnosis not present

## 2022-08-21 DIAGNOSIS — Z83438 Family history of other disorder of lipoprotein metabolism and other lipidemia: Secondary | ICD-10-CM

## 2022-08-21 DIAGNOSIS — N179 Acute kidney failure, unspecified: Secondary | ICD-10-CM | POA: Diagnosis present

## 2022-08-21 DIAGNOSIS — R55 Syncope and collapse: Secondary | ICD-10-CM | POA: Diagnosis not present

## 2022-08-21 DIAGNOSIS — Z79899 Other long term (current) drug therapy: Secondary | ICD-10-CM | POA: Diagnosis not present

## 2022-08-21 DIAGNOSIS — E1110 Type 2 diabetes mellitus with ketoacidosis without coma: Secondary | ICD-10-CM | POA: Diagnosis not present

## 2022-08-21 DIAGNOSIS — Z6828 Body mass index (BMI) 28.0-28.9, adult: Secondary | ICD-10-CM

## 2022-08-21 DIAGNOSIS — Z7984 Long term (current) use of oral hypoglycemic drugs: Secondary | ICD-10-CM | POA: Diagnosis not present

## 2022-08-21 DIAGNOSIS — R4182 Altered mental status, unspecified: Secondary | ICD-10-CM | POA: Diagnosis not present

## 2022-08-21 DIAGNOSIS — F29 Unspecified psychosis not due to a substance or known physiological condition: Secondary | ICD-10-CM | POA: Diagnosis not present

## 2022-08-21 DIAGNOSIS — Z888 Allergy status to other drugs, medicaments and biological substances status: Secondary | ICD-10-CM | POA: Diagnosis not present

## 2022-08-21 DIAGNOSIS — E0910 Drug or chemical induced diabetes mellitus with ketoacidosis without coma: Secondary | ICD-10-CM

## 2022-08-21 DIAGNOSIS — R231 Pallor: Secondary | ICD-10-CM | POA: Diagnosis not present

## 2022-08-21 DIAGNOSIS — R9431 Abnormal electrocardiogram [ECG] [EKG]: Secondary | ICD-10-CM | POA: Diagnosis not present

## 2022-08-21 DIAGNOSIS — Z8249 Family history of ischemic heart disease and other diseases of the circulatory system: Secondary | ICD-10-CM

## 2022-08-21 DIAGNOSIS — G4730 Sleep apnea, unspecified: Secondary | ICD-10-CM | POA: Diagnosis present

## 2022-08-21 DIAGNOSIS — Z8261 Family history of arthritis: Secondary | ICD-10-CM | POA: Diagnosis not present

## 2022-08-21 DIAGNOSIS — R739 Hyperglycemia, unspecified: Secondary | ICD-10-CM | POA: Diagnosis not present

## 2022-08-21 DIAGNOSIS — G4733 Obstructive sleep apnea (adult) (pediatric): Secondary | ICD-10-CM | POA: Diagnosis present

## 2022-08-21 DIAGNOSIS — Z7985 Long-term (current) use of injectable non-insulin antidiabetic drugs: Secondary | ICD-10-CM

## 2022-08-21 DIAGNOSIS — R45851 Suicidal ideations: Secondary | ICD-10-CM | POA: Diagnosis not present

## 2022-08-21 LAB — COMPREHENSIVE METABOLIC PANEL
ALT: 24 U/L (ref 0–44)
AST: 29 U/L (ref 15–41)
Albumin: 4.1 g/dL (ref 3.5–5.0)
Alkaline Phosphatase: 90 U/L (ref 38–126)
Anion gap: 22 — ABNORMAL HIGH (ref 5–15)
BUN: 17 mg/dL (ref 6–20)
CO2: 16 mmol/L — ABNORMAL LOW (ref 22–32)
Calcium: 10 mg/dL (ref 8.9–10.3)
Chloride: 98 mmol/L (ref 98–111)
Creatinine, Ser: 1.66 mg/dL — ABNORMAL HIGH (ref 0.44–1.00)
GFR, Estimated: 36 mL/min — ABNORMAL LOW (ref >60)
Glucose, Bld: 252 mg/dL — ABNORMAL HIGH (ref 70–99)
Potassium: 4.7 mmol/L (ref 3.5–5.1)
Sodium: 136 mmol/L (ref 135–145)
Total Bilirubin: 1.3 mg/dL — ABNORMAL HIGH (ref 0.3–1.2)
Total Protein: 8.8 g/dL — ABNORMAL HIGH (ref 6.5–8.1)

## 2022-08-21 LAB — URINALYSIS, W/ REFLEX TO CULTURE (INFECTION SUSPECTED)
Bacteria, UA: NONE SEEN
Bilirubin Urine: NEGATIVE
Glucose, UA: >=500 mg/dL — AB
Hgb urine dipstick: NEGATIVE
Ketones, ur: 80 mg/dL — AB
Leukocytes,Ua: NEGATIVE
Nitrite: NEGATIVE
Protein, ur: NEGATIVE mg/dL
Specific Gravity, Urine: 1.021 (ref 1.005–1.030)
pH: 5 (ref 5.0–8.0)

## 2022-08-21 LAB — BASIC METABOLIC PANEL
Anion gap: 19 — ABNORMAL HIGH (ref 5–15)
BUN: 16 mg/dL (ref 6–20)
CO2: 14 mmol/L — ABNORMAL LOW (ref 22–32)
Calcium: 9.4 mg/dL (ref 8.9–10.3)
Chloride: 103 mmol/L (ref 98–111)
Creatinine, Ser: 1.44 mg/dL — ABNORMAL HIGH (ref 0.44–1.00)
GFR, Estimated: 42 mL/min — ABNORMAL LOW (ref >60)
Glucose, Bld: 187 mg/dL — ABNORMAL HIGH (ref 70–99)
Potassium: 4.7 mmol/L (ref 3.5–5.1)
Sodium: 136 mmol/L (ref 135–145)

## 2022-08-21 LAB — BETA-HYDROXYBUTYRIC ACID
Beta-Hydroxybutyric Acid: >8 mmol/L — ABNORMAL HIGH (ref 0.05–0.27)
Beta-Hydroxybutyric Acid: 5.77 mmol/L — ABNORMAL HIGH (ref 0.05–0.27)

## 2022-08-21 LAB — GLUCOSE, CAPILLARY
Glucose-Capillary: 190 mg/dL — ABNORMAL HIGH (ref 70–99)
Glucose-Capillary: 164 mg/dL — ABNORMAL HIGH (ref 70–99)
Glucose-Capillary: 170 mg/dL — ABNORMAL HIGH (ref 70–99)
Glucose-Capillary: 171 mg/dL — ABNORMAL HIGH (ref 70–99)

## 2022-08-21 LAB — CBC
HCT: 45.1 % (ref 36.0–46.0)
Hemoglobin: 13.2 g/dL (ref 12.0–15.0)
MCH: 22.3 pg — ABNORMAL LOW (ref 26.0–34.0)
MCHC: 29.3 g/dL — ABNORMAL LOW (ref 30.0–36.0)
MCV: 76.1 fL — ABNORMAL LOW (ref 80.0–100.0)
Platelets: 418 10*3/uL — ABNORMAL HIGH (ref 150–400)
RBC: 5.93 MIL/uL — ABNORMAL HIGH (ref 3.87–5.11)
RDW: 17.5 % — ABNORMAL HIGH (ref 11.5–15.5)
WBC: 11.9 10*3/uL — ABNORMAL HIGH (ref 4.0–10.5)
nRBC: 0 % (ref 0.0–0.2)

## 2022-08-21 LAB — I-STAT VENOUS BLOOD GAS, ED
Acid-base deficit: 12 mmol/L — ABNORMAL HIGH (ref 0.0–2.0)
Bicarbonate: 16.4 mmol/L — ABNORMAL LOW (ref 20.0–28.0)
Calcium, Ion: 1.23 mmol/L (ref 1.15–1.40)
HCT: 49 % — ABNORMAL HIGH (ref 36.0–46.0)
Hemoglobin: 16.7 g/dL — ABNORMAL HIGH (ref 12.0–15.0)
O2 Saturation: 57 %
Potassium: 4.6 mmol/L (ref 3.5–5.1)
Sodium: 136 mmol/L (ref 135–145)
TCO2: 18 mmol/L — ABNORMAL LOW (ref 22–32)
pCO2, Ven: 46.8 mmHg (ref 44–60)
pH, Ven: 7.152 — CL (ref 7.25–7.43)
pO2, Ven: 38 mmHg (ref 32–45)

## 2022-08-21 LAB — LACTIC ACID, PLASMA
Lactic Acid, Venous: 3.4 mmol/L (ref 0.5–1.9)
Lactic Acid, Venous: 2.3 mmol/L (ref 0.5–1.9)

## 2022-08-21 LAB — CBG MONITORING, ED: Glucose-Capillary: 223 mg/dL — ABNORMAL HIGH (ref 70–99)

## 2022-08-21 LAB — MRSA NEXT GEN BY PCR, NASAL: MRSA by PCR Next Gen: NOT DETECTED

## 2022-08-21 LAB — ETHANOL: Alcohol, Ethyl (B): <10 mg/dL (ref <10)

## 2022-08-21 LAB — LITHIUM LEVEL: Lithium Lvl: 0.19 mmol/L — ABNORMAL LOW (ref 0.60–1.20)

## 2022-08-21 LAB — AMMONIA: Ammonia: 12 umol/L (ref 9–35)

## 2022-08-21 LAB — HIV ANTIBODY (ROUTINE TESTING W REFLEX): HIV Screen 4th Generation wRfx: NONREACTIVE

## 2022-08-21 LAB — LIPASE, BLOOD: Lipase: 24 U/L (ref 11–51)

## 2022-08-21 MED ORDER — LACTATED RINGERS IV BOLUS
1000.0000 mL | Freq: Once | INTRAVENOUS | Status: AC
  Administered 2022-08-21: 1000 mL via INTRAVENOUS

## 2022-08-21 MED ORDER — ARIPIPRAZOLE 5 MG PO TABS
5.0000 mg | ORAL_TABLET | Freq: Every day | ORAL | Status: DC
  Administered 2022-08-21 – 2022-08-22 (×2): 5 mg via ORAL
  Filled 2022-08-21 (×3): qty 1

## 2022-08-21 MED ORDER — LACTATED RINGERS IV BOLUS
500.0000 mL | Freq: Once | INTRAVENOUS | Status: AC
  Administered 2022-08-21: 500 mL via INTRAVENOUS

## 2022-08-21 MED ORDER — LACTATED RINGERS IV SOLN: INTRAVENOUS | Status: DC

## 2022-08-21 MED ORDER — SODIUM CHLORIDE 0.9 % IV SOLN: 1000.0000 mL | INTRAVENOUS | Status: DC

## 2022-08-21 MED ORDER — DEXTROSE 50 % IV SOLN: 0.0000 mL | INTRAVENOUS | Status: DC | PRN

## 2022-08-21 MED ORDER — ONDANSETRON HCL 4 MG/2ML IJ SOLN: 4.0000 mg | Freq: Once | INTRAMUSCULAR | Status: DC

## 2022-08-21 MED ORDER — INSULIN REGULAR(HUMAN) IN NACL 100-0.9 UT/100ML-% IV SOLN
INTRAVENOUS | Status: DC
  Administered 2022-08-21: 7.5 [IU]/h via INTRAVENOUS
  Filled 2022-08-21: qty 100

## 2022-08-21 MED ORDER — ENOXAPARIN SODIUM 40 MG/0.4ML IJ SOSY
40.0000 mg | PREFILLED_SYRINGE | INTRAMUSCULAR | Status: DC
  Administered 2022-08-21 – 2022-08-22 (×2): 40 mg via SUBCUTANEOUS
  Filled 2022-08-21 (×2): qty 0.4

## 2022-08-21 MED ORDER — POTASSIUM CHLORIDE 10 MEQ/100ML IV SOLN
10.0000 meq | INTRAVENOUS | Status: AC
  Administered 2022-08-21: 10 meq via INTRAVENOUS
  Filled 2022-08-21: qty 100

## 2022-08-21 MED ORDER — PRAVASTATIN SODIUM 40 MG PO TABS
40.0000 mg | ORAL_TABLET | Freq: Every day | ORAL | Status: DC
  Administered 2022-08-22 – 2022-08-23 (×2): 40 mg via ORAL
  Filled 2022-08-21 (×2): qty 1

## 2022-08-21 MED ORDER — ONDANSETRON HCL 4 MG/2ML IJ SOLN
4.0000 mg | Freq: Once | INTRAMUSCULAR | Status: AC
  Administered 2022-08-21: 4 mg via INTRAVENOUS
  Filled 2022-08-21: qty 2

## 2022-08-21 MED ORDER — DEXTROSE IN LACTATED RINGERS 5 % IV SOLN: INTRAVENOUS | Status: DC

## 2022-08-21 MED ORDER — STERILE WATER FOR INJECTION IV SOLN
INTRAVENOUS | Status: DC
  Filled 2022-08-21: qty 1000

## 2022-08-21 MED ORDER — ORAL CARE MOUTH RINSE: 15.0000 mL | OROMUCOSAL | Status: DC | PRN

## 2022-08-21 MED ORDER — SODIUM CHLORIDE 0.9 % IV BOLUS (SEPSIS): 1000.0000 mL | Freq: Once | INTRAVENOUS | Status: DC

## 2022-08-21 NOTE — ED Provider Triage Note (Signed)
Emergency Medicine Provider Triage Evaluation Note  [name] , a 57 y.o. female  was evaluated in triage.  Pt complains of weakness and fatigue.  Patient has been feeling weak and fatigued as well as vomiting over the last 3 days.  Patient states symptoms started after receiving ketamine infusion.  Patient states feels weak all over.  Denies any headache or chest pain.  No abdominal pain.  Review of Systems  Positive: Weakness Negative: Headache  Physical Exam  BP 124/87   Pulse 85   Temp (!) 97.5 F (36.4 C) (Oral)   Resp 14   SpO2 98%  Gen:   Awake, no distress, restless Resp:  Normal effort  MSK:   Moves extremities without difficulty  Other:    Medical Decision Making  Medically screening exam initiated at 12:46 PM.  Appropriate orders placed.  [name] was informed that the remainder of the evaluation will be completed by another provider, this initial triage assessment does not replace that evaluation, and the importance of remaining in the ED until their evaluation is complete.     Linwood Dibbles, MD 08/21/22 1247

## 2022-08-21 NOTE — ED Provider Notes (Signed)
[place] EMERGENCY DEPARTMENT AT [facility] Provider Note   CSN: [id number] Arrival date & time: 08/21/22  1223     History  Chief Complaint  Patient presents with   Emesis   Nausea   Dizziness    [name] is a 57 y.o. female.  HPI 57 year old female with a history of depression and type 2 diabetes presents with vomiting.  History is from patient and husband.  Per the husband, the patient has been fatigued and acting slowly for months.  Went to her psychiatrist appointment (first visit) and a few minutes after getting there is apparently vomited a few times.  Patient states she is also had 1 episode of diarrhea.  She was "out of it" for a few minutes according to the husband though he did not see this, states this was reported from the psychiatrist.  Right now the patient does not appear confused to him.  Patient denies any acute complaints besides nausea and vomiting including no headache, chest pain, shortness of breath, or recent illness.  No fevers.  Home Medications Prior to Admission medications   Medication Sig Start Date End Date Taking? Authorizing Provider  ARIPiprazole (ABILIFY) 5 MG tablet Take 1/2 a tablet (2.5 mg total) for one week, then increase to one whole (5 mg total) tablet by mouth at bedtime. 01/19/22   Arfeen, Phillips Grout, MD  aspirin 81 MG tablet Take 81 mg by mouth daily.    [provider]  Blood Glucose Monitoring Suppl (CONTOUR NEXT MONITOR) w/Device KIT Use to test blood sugar daily 08/14/20     Blood Glucose Monitoring Suppl (ONETOUCH VERIO IQ SYSTEM) w/Device KIT Use as directed to check sugars daily 07/25/20     Continuous Blood Gluc Receiver (FREESTYLE LIBRE 2 READER) DEVI Use as directed 09/16/20     Continuous Blood Gluc Sensor (FREESTYLE LIBRE 2 SENSOR) MISC Apply 1 sensor every 14 days 10/01/20   Marlene Lard, MD  empagliflozin (JARDIANCE) 25 MG TABS tablet Take 1 tablet (25 mg total) by mouth daily. 02/26/21      Erenumab-aooe (AIMOVIG) 70 MG/ML SOAJ Inject into the skin. Patient not taking: Reported on 12/31/2021    [provider]  Erenumab-aooe (AIMOVIG) 70 MG/ML SOAJ INJECT 1 PEN UNDER THE SKIN ONCE A MONTH Patient not taking: Reported on 12/31/2021 08/06/20     glimepiride (AMARYL) 4 MG tablet TAKE 1 TABLET BY MOUTH TWICE DAILY 08/06/20     glucose blood (ONETOUCH VERIO) test strip Use as instructed 06/25/16   Reather Littler, MD  glucose blood (ONETOUCH VERIO) test strip Use to check blood sugar as directed 07/30/20     glucose blood (ONETOUCH VERIO) test strip Use as directed to check blood sugars daily 08/06/20     glucose blood test strip Use to test blood sugar daily 08/14/20     IRON PO Take by mouth. States she takes once a day    [provider]  Lancets (ONETOUCH DELICA PLUS [place]) MISC Use as directed to check sugars daily 07/25/20     lisinopril (PRINIVIL,ZESTRIL) 10 MG tablet TAKE 1 TABLET (10 MG TOTAL) BY MOUTH DAILY. 01/31/16   Terressa Koyanagi, DO  meloxicam (MOBIC) 15 MG tablet TAKE 1 TABLET BY MOUTH ONCE DAILY 01/15/21     metFORMIN (GLUCOPHAGE-XR) 500 MG 24 hr tablet TAKE 4 TABLETS BY MOUTH DAILY WITH SUPPER. 09/28/16   Reather Littler, MD  Microlet Lancets MISC Use to test blood sugar daily 08/14/20  naproxen (NAPROSYN) 500 MG tablet Take 1 tablet (500 mg total) by mouth every 12 (twelve) hours as needed for mild pain. 05/23/17   Albertine Grates, MD  ondansetron (ZOFRAN-ODT) 8 MG disintegrating tablet DISSOLVE 1 TABLET ON THE TONGUE EVERY 8 HOURS FOR 5 DAYS 07/25/20     pravastatin (PRAVACHOL) 40 MG tablet Take 1 tablet (40 mg total) by mouth daily. 09/28/16   Reather Littler, MD  Semaglutide,0.25 or 0.5MG /DOS, (OZEMPIC, 0.25 OR 0.5 MG/DOSE,) 2 MG/1.5ML SOPN Inject 0.5 mg subcutaneously once a week 02/26/21     triamcinolone ointment (KENALOG) 0.1 % Apply to the affected area twice daily. 09/25/20     Ubrogepant (UBRELVY) 100 MG TABS Take 1 tablet by mouth as needed for migraine, may take  another tablet at least 2 hours after first dose. 01/30/21     vitamin B-12 (CYANOCOBALAMIN) 1000 MCG tablet Take 1 tablet (1,000 mcg total) by mouth daily. 05/23/17   Albertine Grates, MD  Vitamin D, Ergocalciferol, (DRISDOL) 50000 units CAPS capsule Take 50,000 Units by mouth once a week. 02/23/17   [provider]      Allergies    Liraglutide, Other, and Prozac [fluoxetine]    Review of Systems   Review of Systems  Constitutional:  Positive for fatigue. Negative for fever.  Respiratory:  Negative for shortness of breath.   Cardiovascular:  Negative for chest pain.  Gastrointestinal:  Positive for diarrhea, nausea and vomiting. Negative for abdominal pain.  Neurological:  Negative for headaches.    Physical Exam Updated Vital Signs BP 130/74 (BP Location: Right Arm)   Pulse (!) 104   Temp 98.6 F (37 C) (Oral)   Resp (!) 23   Ht 5\' 3"  (1.6 m)   Wt 73.9 kg   SpO2 96%   BMI 28.87 kg/m  Physical Exam Vitals and nursing note reviewed.  Constitutional:      Appearance: She is well-developed.  HENT:     Head: Normocephalic and atraumatic.  Eyes:     Extraocular Movements: Extraocular movements intact.     Pupils: Pupils are equal, round, and reactive to light.  Cardiovascular:     Rate and Rhythm: Normal rate and regular rhythm.     Heart sounds: Normal heart sounds.  Pulmonary:     Effort: Pulmonary effort is normal.     Breath sounds: Normal breath sounds.  Abdominal:     Palpations: Abdomen is soft.     Tenderness: There is no abdominal tenderness.  Skin:    General: Skin is warm and dry.  Neurological:     Mental Status: She is alert.     Comments: Patient is slow to respond, but answers questions appropriately, and is alert and oriented to person, place, time, and situation. CN 3-12 grossly intact. 5/5 strength in all 4 extremities. Grossly normal sensation. Normal finger to nose.      ED Results / Procedures / Treatments   Labs (all labs ordered are listed,  but only abnormal results are displayed) Labs Reviewed  COMPREHENSIVE METABOLIC PANEL - Abnormal; Notable for the following components:      Result Value   CO2 16 (*)    Glucose, Bld 252 (*)    Creatinine, Ser 1.66 (*)    Total Protein 8.8 (*)    Total Bilirubin 1.3 (*)    GFR, Estimated 36 (*)    Anion gap 22 (*)    All other components within normal limits  CBC - Abnormal; Notable for the following  components:   WBC 11.9 (*)    RBC 5.93 (*)    MCV 76.1 (*)    MCH 22.3 (*)    MCHC 29.3 (*)    RDW 17.5 (*)    Platelets 418 (*)    All other components within normal limits  LITHIUM LEVEL - Abnormal; Notable for the following components:   Lithium Lvl 0.19 (*)    All other components within normal limits  BETA-HYDROXYBUTYRIC ACID - Abnormal; Notable for the following components:   Beta-Hydroxybutyric Acid >8.00 (*)    All other components within normal limits  LACTIC ACID, PLASMA - Abnormal; Notable for the following components:   Lactic Acid, Venous 3.4 (*)    All other components within normal limits  URINALYSIS, W/ REFLEX TO CULTURE (INFECTION SUSPECTED) - Abnormal; Notable for the following components:   Glucose, UA >=500 (*)    Ketones, ur 80 (*)    All other components within normal limits  CBG MONITORING, ED - Abnormal; Notable for the following components:   Glucose-Capillary 223 (*)    All other components within normal limits  I-STAT VENOUS BLOOD GAS, ED - Abnormal; Notable for the following components:   pH, Ven 7.152 (*)    Bicarbonate 16.4 (*)    TCO2 18 (*)    Acid-base deficit 12.0 (*)    HCT 49.0 (*)    Hemoglobin 16.7 (*)    All other components within normal limits  LIPASE, BLOOD  ETHANOL  AMMONIA  RAPID URINE DRUG SCREEN, HOSP PERFORMED  LACTIC ACID, PLASMA  BASIC METABOLIC PANEL  BASIC METABOLIC PANEL  BASIC METABOLIC PANEL  BASIC METABOLIC PANEL  BETA-HYDROXYBUTYRIC ACID  BETA-HYDROXYBUTYRIC ACID  HIV ANTIBODY (ROUTINE TESTING W REFLEX)   HEMOGLOBIN A1C  CBC  CBG MONITORING, ED    EKG EKG Interpretation  Date/Time:  Friday Aug 21 2022 16:22:04 EDT Ventricular Rate:  86 PR Interval:  137 QRS Duration: 84 QT Interval:  440 QTC Calculation: 527 R Axis:   56 Text Interpretation: Sinus rhythm Nonspecific T abnormalities, diffuse leads Prolonged QT interval similar to 2019 Confirmed by Pricilla Loveless [phone]) on 08/21/2022 4:24:14 PM  Radiology CT Head Wo Contrast  Result Date: 08/21/2022 CLINICAL DATA:  Mental status change, unknown cause. Weakness and fatigue. EXAM: CT HEAD WITHOUT CONTRAST TECHNIQUE: Contiguous axial images were obtained from the base of the skull through the vertex without intravenous contrast. RADIATION DOSE REDUCTION: This exam was performed according to the departmental dose-optimization program which includes automated exposure control, adjustment of the mA and/or kV according to patient size and/or use of iterative reconstruction technique. COMPARISON:  Head MRI 05/18/2020 FINDINGS: Brain: There is no evidence of an acute infarct, intracranial hemorrhage, mass, midline shift, or extra-axial fluid collection. The ventricles and sulci are normal. A partially empty sella is unchanged. Vascular: No hyperdense vessel. Skull: No fracture or suspicious osseous lesion. Sinuses/Orbits: Paranasal sinuses and mastoid air cells are clear. Unremarkable orbits. Other: None. IMPRESSION: No evidence of acute intracranial abnormality. Electronically Signed   By: Sebastian Ache M.D.   On: 08/21/2022 14:43    Procedures .Critical Care  Performed by: Pricilla Loveless, MD Authorized by: Pricilla Loveless, MD   Critical care provider statement:    Critical care time (minutes):  35   Critical care time was exclusive of:  Separately billable procedures and treating other patients   Critical care was necessary to treat or prevent imminent or life-threatening deterioration of the following conditions:  Endocrine crisis, metabolic  crisis and  renal failure   Critical care was time spent personally by me on the following activities:  Development of treatment plan with patient or surrogate, discussions with consultants, evaluation of patient's response to treatment, examination of patient, ordering and review of laboratory studies, ordering and review of radiographic studies, ordering and performing treatments and interventions, pulse oximetry, re-evaluation of patient's condition and review of old charts     Medications Ordered in ED Medications  insulin regular, human (MYXREDLIN) 100 units/ 100 mL infusion (7.5 Units/hr Intravenous New Bag/Given 08/21/22 1857)  lactated ringers infusion ( Intravenous Not Given 08/21/22 1858)  dextrose 5 % in lactated ringers infusion ( Intravenous New Bag/Given 08/21/22 1856)  dextrose 50 % solution 0-50 mL (has no administration in time range)  potassium chloride 10 mEq in 100 mL IVPB (10 mEq Intravenous New Bag/Given 08/21/22 1943)  pravastatin (PRAVACHOL) tablet 40 mg (has no administration in time range)  enoxaparin (LOVENOX) injection 40 mg (40 mg Subcutaneous Given 08/21/22 1943)  ARIPiprazole (ABILIFY) tablet 5 mg (has no administration in time range)  lactated ringers bolus 1,000 mL (0 mLs Intravenous Stopped 08/21/22 1758)  lactated ringers bolus 1,000 mL (0 mLs Intravenous Stopped 08/21/22 1758)  ondansetron (ZOFRAN) injection 4 mg (4 mg Intravenous Given 08/21/22 1857)  lactated ringers bolus 500 mL (500 mLs Intravenous New Bag/Given 08/21/22 1943)    ED Course/ Medical Decision Making/ A&P                             Medical Decision Making Amount and/or Complexity of Data Reviewed Independent Historian: spouse Labs: ordered.    Details: Metabolic anion gap acidosis. AKI Radiology: independent interpretation performed.    Details: No head bleed ECG/medicine tests: ordered and independent interpretation performed.    Details: No acute changes from  baseline.  Risk Prescription drug management. Decision regarding hospitalization.   I think patient's vomiting is from her metabolic acidosis. No infectious symptoms. I suspect this is a euglycemic DKA, likely from her jardiance. She was given 2L IVF bolus and then started on insulin and glucose drips. No focal neuro deficits or AMS here.  Discussed with Dr. Alinda Money, who will admit.        Final Clinical Impression(s) / ED Diagnoses Final diagnoses:  Diabetic ketoacidosis without coma associated with drug or chemical induced diabetes mellitus [facility])    Rx / DC Orders ED Discharge Orders     None         Pricilla Loveless, MD 08/21/22 [phone]

## 2022-08-21 NOTE — ED Triage Notes (Addendum)
Pt BIB [facility] d/t a sudden onset of vomiting, weakness &dizziness the past 3 days while at a ketamine infusion center she was recommended to by her PCP after being started on Lithium when she told them she was SI. PCP there reports they did not do blood work prior to starting her on the Lithium (per EMS). VSS, no emesis events for EMS, EKG was good per EMS, was given 150cc LR in 18g Lt AC PIV. Pt denies having any active thoughts of harming herself or others while in triage.

## 2022-08-21 NOTE — ED Notes (Signed)
ED TO INPATIENT HANDOFF REPORT  ED Nurse Name and Phone #: [phone] [name]   Dickerson Name/Age/Gender [name] 57 y.o. female Room/Bed: 003C/003C  Code Status   Code Status: Full Code  Home/SNF/Other Home Patient oriented to: self, place, time, and situation Is this baseline? Yes   Triage Complete: Triage complete  Chief Complaint DKA (diabetic ketoacidosis) (HCC) [E11.10]  Triage Note Pt BIB [facility] d/t a sudden onset of vomiting, weakness &dizziness the past 3 days while at a ketamine infusion center she was recommended to by her PCP after being started on Lithium when she told them she was SI. PCP there reports they did not do blood work prior to starting her on the Lithium (per EMS). VSS, no emesis events for EMS, EKG was good per EMS, was given 150cc LR in 18g Lt AC PIV. Pt denies having any active thoughts of harming herself or others while in triage.    Allergies Allergies  Allergen Reactions   Liraglutide Other (See Comments)   Other Other (See Comments)   Prozac [Fluoxetine]     Level of Care/Admitting Diagnosis ED Disposition     ED Disposition  Admit   Condition  --   Comment  Hospital Area: [facility] [[id number]]  Level of Care: Progressive [102]  Admit to Progressive based on following criteria: GI, ENDOCRINE disease patients with GI bleeding, acute liver failure or pancreatitis, stable with diabetic ketoacidosis or thyrotoxicosis (hypothyroid) state.  Admit to Progressive based on following criteria: ACUTE MENTAL DISORDER-RELATED Drug/Alcohol Ingestion/Overdose/Withdrawal, Suicidal Ideation/attempt requiring safety sitter and < Q2h monitoring/assessments, moderate to severe agitation that is managed with medication/sitter, CIWA-Ar score < 20.  May place patient in observation at [facility] or [facility] if equivalent level of care is available:: No  Covid Evaluation: Asymptomatic - no recent exposure (last 10 days) testing not  required  Diagnosis: DKA (diabetic ketoacidosis) [facility]) [[id number]]  Admitting Physician: Synetta Fail [[id number]]  Attending Physician: Synetta Fail [[id number]]          B Medical/Surgery History Past Medical History:  Diagnosis Date   Arthritis    left shoulder    Chicken pox    CHILDHOOD   Depression    Diabetes mellitus without complication (HCC)    type 2   Headache(784.0)    migraine   Hyperlipidemia    Hypertension    Hyperthyroidism    Left ureteral stone    Low iron 04/2017   history of    Multinodular goiter 12/28/2012   Sleep apnea    currently no cpap ; waiting to have another sleep study    Past Surgical History:  Procedure Laterality Date   CHOLECYSTECTOMY  2006   emergency surgery . unaware of surgeon ; states done at [facility]    COLONOSCOPY  10/2016   CYSTOSCOPY W/ URETERAL STENT PLACEMENT Left 05/21/2017   Procedure: CYSTOSCOPY WITH RETROGRADE PYELOGRAM/URETERAL STENT PLACEMENT;  Surgeon: Hildred Laser, MD;  Location: [facility];  Service: Urology;  Laterality: Left;   CYSTOSCOPY/URETEROSCOPY/HOLMIUM LASER/STENT PLACEMENT Left 06/14/2017   Procedure: CYSTOSCOPY/URETEROSCOPY/STENT REMOVAL;  Surgeon: Hildred Laser, MD;  Location: [facility];  Service: Urology;  Laterality: Left;   ROBOT ASSISTED MYOMECTOMY Left 06/02/2012   Procedure: ROBOTIC ASSISTED MYOMECTOMY;  Surgeon: Serita Kyle, MD;  Location: [facility];  Service: Gynecology;  Laterality: Left;  anexal mass is a myoma left side   ROBOTIC ASSISTED LAPAROSCOPIC LYSIS OF ADHESION N/A 06/02/2012  Procedure: ROBOTIC ASSISTED LAPAROSCOPIC LYSIS OF ADHESION;  Surgeon: Serita Kyle, MD;  Location: [facility];  Service: Gynecology;  Laterality: N/A;   ROBOTIC ASSISTED SALPINGO OOPHERECTOMY Left 06/02/2012   Procedure: ROBOTIC ASSISTED SALPINGO OOPHORECTOMY;  Surgeon: Serita Kyle, MD;  Location: [facility];  Service: Gynecology;  Laterality: Left;  pelvic  washings   ULNAR NERVE TRANSPOSITION Right 08/15/2013   Procedure: RIGHT ULNAR NERVE IN SITU DECOMPRESSION/POSSIBLE TRANSPOSITION;  Surgeon: Wyn Forster., MD;  Location: [facility];  Service: Orthopedics;  Laterality: Right;   VAGINAL HYSTERECTOMY  09/15/2005   with right salpingectomy and uterine morcellation ; Dr Kirkland Hun; [facility]      A IV Location/Drains/Wounds Patient Lines/Drains/Airways Status     Active Line/Drains/Airways     Name Placement date Placement time Site Days   Peripheral IV 08/21/22 18 G Left Antecubital 08/21/22  1238  Antecubital  less than 1   Incision 06/02/12 Abdomen Other (Comment) 06/02/12  1459  -- 3732   Incision 06/02/12 Perineum Other (Comment) 06/02/12  1459  -- 3732   Incision (Closed) 08/15/13 Other (Comment) Right 08/15/13  0800  -- 3293   Incision (Closed) 05/21/17 Perineum Other (Comment) 05/21/17  1429  -- 1918   Incision (Closed) 06/14/17 Vagina Other (Comment) 06/14/17  1411  -- 1894   Incision - 5 Ports Abdomen 1: Umbilicus 2: Left;Lateral  Left;Lateral;Lower  Right;Lateral 5: Right;Lower;Lateral 06/02/12  --  -- 3732            Intake/Output Last 24 hours  Intake/Output Summary (Last 24 hours) at 08/21/2022 1905 Last data filed at 08/21/2022 1758 Gross per 24 hour  Intake 2000 ml  Output --  Net 2000 ml    Labs/Imaging Results for orders placed or performed during the hospital encounter of 08/21/22 (from the past 48 hour(Dickerson))  Lipase, blood     Status: None   Collection Time: 08/21/22 12:41 PM  Result Value Ref Range   Lipase 24 11 - 51 U/L    Comment: Performed at [facility], [address] [place], [place] [address]  Comprehensive metabolic panel     Status: Abnormal   Collection Time: 08/21/22 12:41 PM  Result Value Ref Range   Sodium 136 135 - 145 mmol/L   Potassium 4.7 3.5 - 5.1 mmol/L   Chloride 98 98 - 111 mmol/L   CO2 16 (L) 22 - 32 mmol/L   Glucose, Bld 252 (H) 70 - 99  mg/dL    Comment: Glucose reference range applies only to samples taken after fasting for at least 8 hours.   BUN 17 6 - 20 mg/dL   Creatinine, Ser 9.62 (H) 0.44 - 1.00 mg/dL   Calcium 95.2 8.9 - 84.1 mg/dL   Total Protein 8.8 (H) 6.5 - 8.1 g/dL   Albumin 4.1 3.5 - 5.0 g/dL   AST 29 15 - 41 U/L   ALT 24 0 - 44 U/L   Alkaline Phosphatase 90 38 - 126 U/L   Total Bilirubin 1.3 (H) 0.3 - 1.2 mg/dL   GFR, Estimated 36 (L) >60 mL/min    Comment: (NOTE) Calculated using the CKD-EPI Creatinine Equation (2021)    Anion gap 22 (H) 5 - 15    Comment: Electrolytes repeated to confirm. Performed at [facility], [address] [place], [place] [address]   CBC     Status: Abnormal   Collection Time: 08/21/22 12:41 PM  Result Value Ref Range  WBC 11.9 (H) 4.0 - 10.5 K/uL   RBC 5.93 (H) 3.87 - 5.11 MIL/uL   Hemoglobin 13.2 12.0 - 15.0 g/dL   HCT 16.1 09.6 - 04.5 %   MCV 76.1 (L) 80.0 - 100.0 fL   MCH 22.3 (L) 26.0 - 34.0 pg   MCHC 29.3 (L) 30.0 - 36.0 g/dL   RDW 40.9 (H) 81.1 - 91.4 %   Platelets 418 (H) 150 - 400 K/uL   nRBC 0.0 0.0 - 0.2 %    Comment: Performed at [facility], [address] [place], [place] [address]  Ethanol     Status: None   Collection Time: 08/21/22 12:46 PM  Result Value Ref Range   Alcohol, Ethyl (B) <10 <10 mg/dL    Comment: (NOTE) Lowest detectable limit for serum alcohol is 10 mg/dL.  For medical purposes only. Performed at [facility], [address] [place], [place] [address]   Ammonia     Status: None   Collection Time: 08/21/22 12:46 PM  Result Value Ref Range   Ammonia 12 9 - 35 umol/L    Comment: Performed at [facility], [address] [place], [place] [address]  Lithium level     Status: Abnormal   Collection Time: 08/21/22 12:46 PM  Result Value Ref Range   Lithium Lvl 0.19 (L) 0.60 - 1.20 mmol/L    Comment: Performed at [facility], [address] [place], [place] [address]  I-Stat venous blood gas,  ED     Status: Abnormal   Collection Time: 08/21/22  4:09 PM  Result Value Ref Range   pH, Ven 7.152 (LL) 7.25 - 7.43   pCO2, Ven 46.8 44 - 60 mmHg   pO2, Ven 38 32 - 45 mmHg   Bicarbonate 16.4 (L) 20.0 - 28.0 mmol/L   TCO2 18 (L) 22 - 32 mmol/L   O2 Saturation 57 %   Acid-base deficit 12.0 (H) 0.0 - 2.0 mmol/L   Sodium 136 135 - 145 mmol/L   Potassium 4.6 3.5 - 5.1 mmol/L   Calcium, Ion 1.23 1.15 - 1.40 mmol/L   HCT 49.0 (H) 36.0 - 46.0 %   Hemoglobin 16.7 (H) 12.0 - 15.0 g/dL   Sample type VENOUS    Comment NOTIFIED PHYSICIAN   Beta-hydroxybutyric acid     Status: Abnormal   Collection Time: 08/21/22  4:13 PM  Result Value Ref Range   Beta-Hydroxybutyric Acid >8.00 (H) 0.05 - 0.27 mmol/L    Comment: RESULTS CONFIRMED BY MANUAL DILUTION Performed at [facility], [address], [place], [place] [address]   Lactic acid, plasma     Status: Abnormal   Collection Time: 08/21/22  4:13 PM  Result Value Ref Range   Lactic Acid, Venous 3.4 (HH) 0.5 - 1.9 mmol/L    Comment: CRITICAL RESULT CALLED TO, READ BACK BY AND VERIFIED WITH W,HICKS RN @1729  08/21/22 E,BENTON Performed at [facility], [address], [place], [place] [address]   Urinalysis, w/ Reflex to Culture (Infection Suspected) -Urine, Clean Catch     Status: Abnormal   Collection Time: 08/21/22  4:14 PM  Result Value Ref Range   Specimen Source URINE, CLEAN CATCH    Color, Urine YELLOW YELLOW   APPearance CLEAR CLEAR   Specific Gravity, Urine 1.021 1.005 - 1.030   pH 5.0 5.0 - 8.0   Glucose, UA >=500 (A) NEGATIVE mg/dL   Hgb urine dipstick NEGATIVE NEGATIVE  Bilirubin Urine NEGATIVE NEGATIVE   Ketones, ur 80 (A) NEGATIVE mg/dL   Protein, ur NEGATIVE NEGATIVE mg/dL   Nitrite NEGATIVE NEGATIVE   Leukocytes,Ua NEGATIVE NEGATIVE   RBC / HPF 0-5 0 - 5 RBC/hpf   WBC, UA 0-5 0 - 5 WBC/hpf    Comment:        Reflex urine culture not performed if WBC <=10, OR if Squamous epithelial cells >5. If Squamous  epithelial cells >5 suggest recollection.    Bacteria, UA NONE SEEN NONE SEEN   Squamous Epithelial / HPF 0-5 0 - 5 /HPF   Mucus PRESENT    Budding Yeast PRESENT     Comment: Performed at [facility], [address], [place], [place] [address]   CT Head Wo Contrast  Result Date: 08/21/2022 CLINICAL DATA:  Mental status change, unknown cause. Weakness and fatigue. EXAM: CT HEAD WITHOUT CONTRAST TECHNIQUE: Contiguous axial images were obtained from the base of the skull through the vertex without intravenous contrast. RADIATION DOSE REDUCTION: This exam was performed according to the departmental dose-optimization program which includes automated exposure control, adjustment of the mA and/or kV according to patient size and/or use of iterative reconstruction technique. COMPARISON:  Head MRI 05/18/2020 FINDINGS: Brain: There is no evidence of an acute infarct, intracranial hemorrhage, mass, midline shift, or extra-axial fluid collection. The ventricles and sulci are normal. A partially empty sella is unchanged. Vascular: No hyperdense vessel. Skull: No fracture or suspicious osseous lesion. Sinuses/Orbits: Paranasal sinuses and mastoid air cells are clear. Unremarkable orbits. Other: None. IMPRESSION: No evidence of acute intracranial abnormality. Electronically Signed   By: Sebastian Ache M.D.   On: 08/21/2022 14:43    Pending Labs Unresulted Labs (From admission, onward)     Start     Ordered   08/28/22 0500  Creatinine, serum  (enoxaparin (LOVENOX)    CrCl >/= 30 ml/min)  Weekly,   R     Comments: while on enoxaparin therapy    08/21/22 1841   08/22/22 0500  Hemoglobin A1c  (Diabetes Ketoacidosis (DKA))  Tomorrow morning,   R       Comments: To assess prior glycemic control.    08/21/22 1841   08/22/22 0500  CBC  Tomorrow morning,   R        08/21/22 1841   08/21/22 2200  Beta-hydroxybutyric acid  (Diabetes Ketoacidosis (DKA))  Now then every 8 hours,   STAT (with URGENT occurrences)       08/21/22 1758   08/21/22 1835  HIV Antibody (routine testing w rflx)  (HIV Antibody (Routine testing w reflex) panel)  Once,   R        08/21/22 1841   08/21/22 1631  Basic metabolic panel  (Diabetes Ketoacidosis (DKA))  STAT Now then every 4 hours ,   STAT      08/21/22 1758   08/21/22 1613  Lactic acid, plasma  Now then every 2 hours,   R (with STAT occurrences)      08/21/22 1612   08/21/22 1246  Rapid urine drug screen (hospital performed)  ONCE - STAT,   STAT        08/21/22 1246            Vitals/Pain Today'Dickerson Vitals   08/21/22 1235 08/21/22 1615 08/21/22 1734 08/21/22 1850  BP:  135/86    Pulse:  87    Resp:  11    Temp:   98.6 F (37 C)   TempSrc:  Oral   SpO2:  97%    Weight:    73.9 kg  Height:    5\' 3"  (1.6 m)  PainSc: 0-No pain       Isolation Precautions No active isolations  Medications Medications  insulin regular, human (MYXREDLIN) 100 units/ 100 mL infusion (7.5 Units/hr Intravenous New Bag/Given 08/21/22 1857)  lactated ringers infusion ( Intravenous Not Given 08/21/22 1858)  dextrose 5 % in lactated ringers infusion ( Intravenous New Bag/Given 08/21/22 1856)  dextrose 50 % solution 0-50 mL (has no administration in time range)  potassium chloride 10 mEq in 100 mL IVPB (has no administration in time range)  pravastatin (PRAVACHOL) tablet 40 mg (has no administration in time range)  enoxaparin (LOVENOX) injection 40 mg (has no administration in time range)  lactated ringers bolus 1,000 mL (0 mLs Intravenous Stopped 08/21/22 1758)  lactated ringers bolus 1,000 mL (0 mLs Intravenous Stopped 08/21/22 1758)  ondansetron (ZOFRAN) injection 4 mg (4 mg Intravenous Given 08/21/22 1857)    Mobility walks     Focused Assessments    R Recommendations: See Admitting Provider Note  Report given to:   Additional Notes:

## 2022-08-21 NOTE — Inpatient Diabetes Management (Signed)
Inpatient Diabetes Program Recommendations  AACE/ADA: New Consensus Statement on Inpatient Glycemic Control (2015)  Target Ranges:  Prepandial:   less than 140 mg/dL      Peak postprandial:   less than 180 mg/dL (1-2 hours)      Critically ill patients:  140 - 180 mg/dL    Latest Reference Range & Units 08/21/22 16:13  Beta-Hydroxybutyric Acid 0.05 - 0.27 mmol/L >8.00 (H)  (H): Data is abnormally high  Latest Reference Range & Units 08/21/22 12:41  Sodium 135 - 145 mmol/L 136  Potassium 3.5 - 5.1 mmol/L 4.7  Chloride 98 - 111 mmol/L 98  CO2 22 - 32 mmol/L 16 (L)  Glucose 70 - 99 mg/dL 161 (H)  BUN 6 - 20 mg/dL 17  Creatinine 0.96 - 0.45 mg/dL 4.09 (H)  Calcium 8.9 - 10.3 mg/dL 81.1  Anion gap 5 - 15  22 (H)  (L): Data is abnormally low (H): Data is abnormally high  Latest Reference Range & Units 08/21/22 18:37 08/21/22 20:07 08/21/22 21:01  Glucose-Capillary 70 - 99 mg/dL 914 (H)  IV Insulin Drip Started 190 (H) 164 (H)  (H): Data is abnormally high   Admit with:  DKA Euglycemic/mildly elevated glucose DKA. In the setting of SGLT2 inhibitor use   History: DM  Home DM Meds: Jardiance 25 mg daily        Amaryl 4 mg BID        Metformin 1000 mg BID        Ozempic 0.5 mg Qweek  Current Orders: IV Insulin Drip     MD- Note A1c Pending, Further BMETs Pending  Please leave on the IV insulin Drip until Anion Gap closer to 12 or less and CO2 level closer to 20 or higher  May need to stop the Jardiance for home until pt can follow up with PCP     --Will follow patient during hospitalization--  Ambrose Finland RN, MSN, CDCES Diabetes Coordinator Inpatient Glycemic Control Team Team Pager: [phone] (8a-5p)

## 2022-08-21 NOTE — H&P (Addendum)
History and Physical   Bradie Candido Dickerson ZOX:[id number] DOB: 12-Nov-1965 DOA: 08/21/2022  PCP: Wilfrid Lund, PA   Patient coming from: Psychiatrist office  Chief Complaint: Nausea and vomiting  HPI: [name] is a 57 y.o. female with medical history significant of Diabetes, OSA, Obese, Migraine, Depression, HTN, HLD presenting with nausea, vomiting and light headedness.  History provided with assistance of chart review and family. Patient was at psychiatrist office today who she is seen for the first time.  She developed several episodes of nausea and vomiting.   Further history provided by husband who states patient has had some fatigue and slowness for months.  This may be related to her depression.  Does report an episode of diarrhea as well.  She denies fevers, chills, chest pain or shortness of breath, abdominal pain, constipation.   ED Course: Vital signs in the ED stable.  Lab workup included CMP with potassium normal at 4.7, bicarb 16, gap 22, creatinine elevated to 1.6 baseline 0.7, glucose 252, protein 8.8, T. bili 1.3.  CBC with leukocytosis to 11.9, platelets 418.  Lipase normal.  Lactic acid 3.4 with repeat pending.  Ethanol level negative, ammonia level negative, UDS and urinalysis pending.  Lithium level subtherapeutic.  VBG with pH 7.152 and normal pCO2.  Beta hydroxybutyric acid elevated at greater than 8.  CT head showed no acute abnormality.  Patient started on insulin drip, given 2 L of fluids and started on a rate of fluids, also received 20 mill equivalents IV potassium in the ED.  Review of Systems: As per HPI otherwise all other systems reviewed and are negative.  Past Medical History:  Diagnosis Date   Arthritis    left shoulder    Chicken pox    CHILDHOOD   Depression    Diabetes mellitus without complication (HCC)    type 2   Headache(784.0)    migraine   Hyperlipidemia    Hypertension    Hyperthyroidism    Left ureteral stone    Low  iron 04/2017   history of    Multinodular goiter 12/28/2012   Sleep apnea    currently no cpap ; waiting to have another sleep study     Past Surgical History:  Procedure Laterality Date   CHOLECYSTECTOMY  2006   emergency surgery . unaware of surgeon ; states done at [facility] [place]    COLONOSCOPY  10/2016   CYSTOSCOPY W/ URETERAL STENT PLACEMENT Left 05/21/2017   Procedure: CYSTOSCOPY WITH RETROGRADE PYELOGRAM/URETERAL STENT PLACEMENT;  Surgeon: Hildred Laser, MD;  Location: [facility];  Service: Urology;  Laterality: Left;   CYSTOSCOPY/URETEROSCOPY/HOLMIUM LASER/STENT PLACEMENT Left 06/14/2017   Procedure: CYSTOSCOPY/URETEROSCOPY/STENT REMOVAL;  Surgeon: Hildred Laser, MD;  Location: [facility];  Service: Urology;  Laterality: Left;   ROBOT ASSISTED MYOMECTOMY Left 06/02/2012   Procedure: ROBOTIC ASSISTED MYOMECTOMY;  Surgeon: Serita Kyle, MD;  Location: [facility];  Service: Gynecology;  Laterality: Left;  anexal mass is a myoma left side   ROBOTIC ASSISTED LAPAROSCOPIC LYSIS OF ADHESION N/A 06/02/2012   Procedure: ROBOTIC ASSISTED LAPAROSCOPIC LYSIS OF ADHESION;  Surgeon: Serita Kyle, MD;  Location: [facility];  Service: Gynecology;  Laterality: N/A;   ROBOTIC ASSISTED SALPINGO OOPHERECTOMY Left 06/02/2012   Procedure: ROBOTIC ASSISTED SALPINGO OOPHORECTOMY;  Surgeon: Serita Kyle, MD;  Location: [facility];  Service: Gynecology;  Laterality: Left;  pelvic washings   ULNAR NERVE TRANSPOSITION Right 08/15/2013   Procedure: RIGHT ULNAR NERVE IN SITU  DECOMPRESSION/POSSIBLE TRANSPOSITION;  Surgeon: Wyn Forster., MD;  Location: [facility];  Service: Orthopedics;  Laterality: Right;   VAGINAL HYSTERECTOMY  09/15/2005   with right salpingectomy and uterine morcellation ; Dr Kirkland Hun; [facility]     Social History  reports that she has never smoked. She has never used smokeless tobacco. She reports current alcohol use. She  reports that she does not use drugs.  Allergies  Allergen Reactions   Liraglutide Other (See Comments)   Other Other (See Comments)   Prozac [Fluoxetine]     Family History  Problem Relation Age of Onset   Arthritis Mother    Mental illness Mother    Arthritis Father    Hyperlipidemia Father    Diabetes Father    Hypertension Sister    Mental illness Sister    Diabetes Sister    Diabetes Brother    Cancer Paternal Aunt        BREAST  Reviewed on admission  Prior to Admission medications   Medication Sig Start Date End Date Taking? Authorizing Provider  ARIPiprazole (ABILIFY) 5 MG tablet Take 1/2 a tablet (2.5 mg total) for one week, then increase to one whole (5 mg total) tablet by mouth at bedtime. 01/19/22   Arfeen, Phillips Grout, MD  aspirin 81 MG tablet Take 81 mg by mouth daily.    [provider]  Blood Glucose Monitoring Suppl (CONTOUR NEXT MONITOR) w/Device KIT Use to test blood sugar daily 08/14/20     Blood Glucose Monitoring Suppl (ONETOUCH VERIO IQ SYSTEM) w/Device KIT Use as directed to check sugars daily 07/25/20     Brexpiprazole (REXULTI) 0.5 MG TABS Take 1 tablet (0.5 mg total) by mouth daily. Patient not taking: Reported on 01/22/2022 12/31/21   Arfeen, Kathi Ludwig T, MD  buPROPion (WELLBUTRIN XL) 150 MG 24 hr tablet TAKE 1 TABLET BY MOUTH ONCE DAILY IN THE MORNING Patient not taking: Reported on 01/22/2022 08/06/20     buPROPion (WELLBUTRIN XL) 150 MG 24 hr tablet Take 1 tablet by mouth in the morning. 01/30/21     cephALEXin (KEFLEX) 500 MG capsule Take 1 capsule (500 mg total) by mouth 4 (four) times daily. Patient not taking: Reported on 04/30/2020 06/14/17   Hildred Laser, MD  Continuous Blood Gluc Receiver (FREESTYLE LIBRE 2 READER) DEVI Use as directed 09/16/20     Continuous Blood Gluc Sensor (FREESTYLE LIBRE 2 SENSOR) MISC Apply 1 sensor every 14 days 10/01/20   Marlene Lard, MD  DULoxetine (CYMBALTA) 30 MG capsule TAKE 2 CAPSULES BY MOUTH ONCE DAILY  08/06/20     empagliflozin (JARDIANCE) 25 MG TABS tablet Take 1 tablet (25 mg total) by mouth daily. 02/26/21     Erenumab-aooe (AIMOVIG) 70 MG/ML SOAJ Inject into the skin. Patient not taking: Reported on 12/31/2021    [provider]  Erenumab-aooe (AIMOVIG) 70 MG/ML SOAJ INJECT 1 PEN UNDER THE SKIN ONCE A MONTH Patient not taking: Reported on 12/31/2021 08/06/20     gabapentin (NEURONTIN) 300 MG capsule Take 1 capsule by mouth 3 times daily as needed. 01/30/21     glimepiride (AMARYL) 4 MG tablet TAKE 1 TABLET BY MOUTH TWICE DAILY 08/06/20     glucose blood (ONETOUCH VERIO) test strip Use as instructed 06/25/16   Reather Littler, MD  glucose blood (ONETOUCH VERIO) test strip Use to check blood sugar as directed 07/30/20     glucose blood (ONETOUCH VERIO) test strip Use as directed to check blood sugars  daily 08/06/20     glucose blood test strip Use to test blood sugar daily 08/14/20     INVOKANA 100 MG TABS tablet TAKE 1 TABLET BY MOUTH BEFORE BREAKFAST Patient not taking: Reported on 04/30/2020 04/20/17   Reather Littler, MD  IRON PO Take by mouth. States she takes once a day    [provider]  Lancets (ONETOUCH DELICA PLUS [place]) MISC Use as directed to check sugars daily 07/25/20     lisinopril (PRINIVIL,ZESTRIL) 10 MG tablet TAKE 1 TABLET (10 MG TOTAL) BY MOUTH DAILY. 01/31/16   Terressa Koyanagi, DO  lisinopril (ZESTRIL) 10 MG tablet TAKE 1 TABLET BY MOUTH ONCE DAILY 08/06/20     lisinopril (ZESTRIL) 10 MG tablet Take 1 tablet by mouth daily. 01/30/21     meloxicam (MOBIC) 15 MG tablet TAKE 1 TABLET BY MOUTH ONCE DAILY 01/15/21     metFORMIN (GLUCOPHAGE) 500 MG tablet TAKE 2 TABLETS BY MOUTH ONCE DAILY WITH SUPPER 08/06/20     metFORMIN (GLUCOPHAGE-XR) 500 MG 24 hr tablet TAKE 4 TABLETS BY MOUTH DAILY WITH SUPPER. 09/28/16   Reather Littler, MD  Microlet Lancets MISC Use to test blood sugar daily 08/14/20     naproxen (NAPROSYN) 500 MG tablet Take 1 tablet (500 mg total) by mouth every 12 (twelve)  hours as needed for mild pain. 05/23/17   Albertine Grates, MD  ondansetron (ZOFRAN-ODT) 8 MG disintegrating tablet DISSOLVE 1 TABLET ON THE TONGUE EVERY 8 HOURS FOR 5 DAYS 07/25/20     pravastatin (PRAVACHOL) 40 MG tablet Take 1 tablet (40 mg total) by mouth daily. 09/28/16   Reather Littler, MD  Semaglutide,0.25 or 0.5MG /DOS, (OZEMPIC, 0.25 OR 0.5 MG/DOSE,) 2 MG/1.5ML SOPN Inject 0.5 mg subcutaneously once a week 02/26/21     triamcinolone ointment (KENALOG) 0.1 % Apply to the affected area twice daily. 09/25/20     Ubrogepant (UBRELVY) 100 MG TABS Take 1 tablet by mouth as needed for migraine, may take another tablet at least 2 hours after first dose. 01/30/21     VICTOZA 18 MG/3ML SOPN INJECT 1.8 MG AS DIRECTED DAILY Patient not taking: Reported on 04/30/2020 01/29/17   Reather Littler, MD  vitamin B-12 (CYANOCOBALAMIN) 1000 MCG tablet Take 1 tablet (1,000 mcg total) by mouth daily. 05/23/17   Albertine Grates, MD  Vitamin D, Ergocalciferol, (DRISDOL) 50000 units CAPS capsule Take 50,000 Units by mouth once a week. 02/23/17   [provider]    Physical Exam: Vitals:   08/21/22 1229 08/21/22 1615 08/21/22 1734  BP: 124/87 135/86   Pulse: 85 87   Resp: 14 11   Temp: (!) 97.5 F (36.4 C)  98.6 F (37 C)  TempSrc: Oral  Oral  SpO2: 98% 97%     Physical Exam Constitutional:      General: She is not in acute distress.    Appearance: Normal appearance.  HENT:     Head: Normocephalic and atraumatic.     Mouth/Throat:     Mouth: Mucous membranes are moist.     Pharynx: Oropharynx is clear.  Eyes:     Extraocular Movements: Extraocular movements intact.     Pupils: Pupils are equal, round, and reactive to light.  Cardiovascular:     Rate and Rhythm: Normal rate and regular rhythm.     Pulses: Normal pulses.     Heart sounds: Normal heart sounds.  Pulmonary:     Effort: Pulmonary effort is normal. No respiratory distress.     Breath  sounds: Normal breath sounds.  Abdominal:     General: Bowel sounds  are normal. There is no distension.     Palpations: Abdomen is soft.     Tenderness: There is no abdominal tenderness.  Musculoskeletal:        General: No swelling or deformity.  Skin:    General: Skin is warm and dry.  Neurological:     General: No focal deficit present.     Mental Status: Mental status is at baseline.  Psychiatric:     Comments: Slow responses, flat affect    Labs on Admission: I have personally reviewed following labs and imaging studies  CBC: Recent Labs  Lab 08/21/22 1241 08/21/22 1609  WBC 11.9*  --   HGB 13.2 16.7*  HCT 45.1 49.0*  MCV 76.1*  --   PLT 418*  --     Basic Metabolic Panel: Recent Labs  Lab 08/21/22 1241 08/21/22 1609  NA 136 136  K 4.7 4.6  CL 98  --   CO2 16*  --   GLUCOSE 252*  --   BUN 17  --   CREATININE 1.66*  --   CALCIUM 10.0  --     GFR: CrCl cannot be calculated (Unknown ideal weight.).  Liver Function Tests: Recent Labs  Lab 08/21/22 1241  AST 29  ALT 24  ALKPHOS 90  BILITOT 1.3*  PROT 8.8*  ALBUMIN 4.1    Urine analysis:    Component Value Date/Time   COLORURINE YELLOW 05/21/2017 1106   APPEARANCEUR HAZY (A) 05/21/2017 1106   LABSPEC 1.022 05/21/2017 1106   PHURINE 5.0 05/21/2017 1106   GLUCOSEU >=500 (A) 05/21/2017 1106   GLUCOSEU NEGATIVE 02/11/2015 0805   HGBUR LARGE (A) 05/21/2017 1106   HGBUR trace-intact 07/06/2007 1428   BILIRUBINUR NEGATIVE 05/21/2017 1106   KETONESUR 5 (A) 05/21/2017 1106   PROTEINUR 30 (A) 05/21/2017 1106   UROBILINOGEN 0.2 02/11/2015 0805   NITRITE NEGATIVE 05/21/2017 1106   LEUKOCYTESUR MODERATE (A) 05/21/2017 1106    Radiological Exams on Admission: CT Head Wo Contrast  Result Date: 08/21/2022 CLINICAL DATA:  Mental status change, unknown cause. Weakness and fatigue. EXAM: CT HEAD WITHOUT CONTRAST TECHNIQUE: Contiguous axial images were obtained from the base of the skull through the vertex without intravenous contrast. RADIATION DOSE REDUCTION: This exam was  performed according to the departmental dose-optimization program which includes automated exposure control, adjustment of the mA and/or kV according to patient size and/or use of iterative reconstruction technique. COMPARISON:  Head MRI 05/18/2020 FINDINGS: Brain: There is no evidence of an acute infarct, intracranial hemorrhage, mass, midline shift, or extra-axial fluid collection. The ventricles and sulci are normal. A partially empty sella is unchanged. Vascular: No hyperdense vessel. Skull: No fracture or suspicious osseous lesion. Sinuses/Orbits: Paranasal sinuses and mastoid air cells are clear. Unremarkable orbits. Other: None. IMPRESSION: No evidence of acute intracranial abnormality. Electronically Signed   By: Sebastian Ache M.D.   On: 08/21/2022 14:43    EKG: Independently reviewed.  Sinus rhythm at 86 bpm.  Nonspecific T wave flattening.  QTc prolonged at 527.  Assessment/Plan Active Problems:   HYPERCHOLESTEROLEMIA   Obesity   Migraine   Sleep apnea   Type 2 diabetes mellitus (HCC)   Depression   HTN (hypertension)   DKA Diabetes > Known history of diabetes.  Currently on multiple medications including SGLT2 inhibitor. > Episodes of nausea and vomiting at the psychiatrist office today. > In ED found to have bicarb 16, gap  22, beta hydroxybutyric acid greater than 28.  Suspicion for euglycemic/mildly elevated glucose DKA.  In the setting of SGLT2 inhibitor use. > Noted to have leukocytosis 11.9 believed to be reactive considering a respiratory or urinary symptoms. > VBG with pH 7.152 but patient is slow to respond but not truly altered she is at her baseline for the past couple months. > Started on insulin drip and IV fluids in the ED.  Also received 20 mill equivalents of IV potassium. - Monitor on progressive unit - Continue on insulin drip - LR at 125 mL/hr until CBG less than 250 - Switch to D5-LR when 1 CBG less than 250 - Nothing by mouth  - BMET every 4 hours - CBG  Q1H - Once anion gap closed 2, start CM diet and if able to eat, administer Lantus 15 units - Continue insulin drip for 1-2 more hours, then discontinue and start SSI-S  - DC fluids if eating, drinking, and off insulin drip - May need discontinuation of SGLT2 inhibitor  AKI > Creatinine elevated to 1.2 from baseline 0.7 in the setting of presumed DKA above. - Continue with IV fluids - Trend renal function and electrolytes as above  Depression > Recently seen by new psychiatrist today. > Lithium started by PCP 1 week ago, working to confirm dose. Subtherapeutic in ED. - Continue home Abilify   Hypertension - Holding home lisinopril in the setting of AKI as above  Hyperlipidemia - Continue home pravastatin  Migraines - No longer on Ubrelvy  OSA - Chart review indicates not currently on CPAP.  Will order so that this is available if patient can tolerate.   DVT prophylaxis: Lovenox Code Status:   Full Family Communication:  Updated at bedside  Disposition Plan:   Patient is from:  Home  Anticipated DC to:  Home  Anticipated DC date:  1 to 3 days  Anticipated DC barriers: None  Consults called:  None Admission status:  Observation, progressive  Severity of Illness: The appropriate patient status for this patient is OBSERVATION. Observation status is judged to be reasonable and necessary in order to provide the required intensity of service to ensure the patient's safety. The patient's presenting symptoms, physical exam findings, and initial radiographic and laboratory data in the context of their medical condition is felt to place them at decreased risk for further clinical deterioration. Furthermore, it is anticipated that the patient will be medically stable for discharge from the hospital within 2 midnights of admission.    Synetta Fail MD [facility]  How to contact the [facility] Attending or Consulting provider 7A - 7P or covering provider during after hours 7P  -7A, for this patient?   Check the care team in [facility] and look for a) attending/consulting TR[facility] provider listed and b) the [facility] team listed Log into [email or web address] and use [place]'s universal password to access. If you do not have the password, please contact the hospital operator. Locate the [facility] provider you are looking for under [facility] and page to a number that you can be directly reached. If you still have difficulty reaching the provider, please page the [facility] (Director on Call) for the Hospitalists listed on amion for assistance.  08/21/2022, 6:36 PM

## 2022-08-22 DIAGNOSIS — D72829 Elevated white blood cell count, unspecified: Secondary | ICD-10-CM | POA: Diagnosis present

## 2022-08-22 DIAGNOSIS — F39 Unspecified mood [affective] disorder: Secondary | ICD-10-CM | POA: Diagnosis present

## 2022-08-22 DIAGNOSIS — E7800 Pure hypercholesterolemia, unspecified: Secondary | ICD-10-CM | POA: Diagnosis present

## 2022-08-22 DIAGNOSIS — F32A Depression, unspecified: Secondary | ICD-10-CM | POA: Diagnosis present

## 2022-08-22 DIAGNOSIS — G4733 Obstructive sleep apnea (adult) (pediatric): Secondary | ICD-10-CM | POA: Diagnosis present

## 2022-08-22 DIAGNOSIS — Z7982 Long term (current) use of aspirin: Secondary | ICD-10-CM | POA: Diagnosis not present

## 2022-08-22 DIAGNOSIS — Z83438 Family history of other disorder of lipoprotein metabolism and other lipidemia: Secondary | ICD-10-CM | POA: Diagnosis not present

## 2022-08-22 DIAGNOSIS — Z888 Allergy status to other drugs, medicaments and biological substances status: Secondary | ICD-10-CM | POA: Diagnosis not present

## 2022-08-22 DIAGNOSIS — Z833 Family history of diabetes mellitus: Secondary | ICD-10-CM | POA: Diagnosis not present

## 2022-08-22 DIAGNOSIS — Z818 Family history of other mental and behavioral disorders: Secondary | ICD-10-CM | POA: Diagnosis not present

## 2022-08-22 DIAGNOSIS — Z79899 Other long term (current) drug therapy: Secondary | ICD-10-CM | POA: Diagnosis not present

## 2022-08-22 DIAGNOSIS — R9431 Abnormal electrocardiogram [ECG] [EKG]: Secondary | ICD-10-CM | POA: Diagnosis present

## 2022-08-22 DIAGNOSIS — Z8261 Family history of arthritis: Secondary | ICD-10-CM | POA: Diagnosis not present

## 2022-08-22 DIAGNOSIS — I10 Essential (primary) hypertension: Secondary | ICD-10-CM | POA: Diagnosis present

## 2022-08-22 DIAGNOSIS — E669 Obesity, unspecified: Secondary | ICD-10-CM | POA: Diagnosis present

## 2022-08-22 DIAGNOSIS — E1110 Type 2 diabetes mellitus with ketoacidosis without coma: Secondary | ICD-10-CM | POA: Diagnosis not present

## 2022-08-22 DIAGNOSIS — Z90710 Acquired absence of both cervix and uterus: Secondary | ICD-10-CM | POA: Diagnosis not present

## 2022-08-22 DIAGNOSIS — G43909 Migraine, unspecified, not intractable, without status migrainosus: Secondary | ICD-10-CM | POA: Diagnosis present

## 2022-08-22 DIAGNOSIS — Z7984 Long term (current) use of oral hypoglycemic drugs: Secondary | ICD-10-CM | POA: Diagnosis not present

## 2022-08-22 DIAGNOSIS — Z9049 Acquired absence of other specified parts of digestive tract: Secondary | ICD-10-CM | POA: Diagnosis not present

## 2022-08-22 DIAGNOSIS — Z8249 Family history of ischemic heart disease and other diseases of the circulatory system: Secondary | ICD-10-CM | POA: Diagnosis not present

## 2022-08-22 DIAGNOSIS — Z803 Family history of malignant neoplasm of breast: Secondary | ICD-10-CM | POA: Diagnosis not present

## 2022-08-22 DIAGNOSIS — N179 Acute kidney failure, unspecified: Secondary | ICD-10-CM | POA: Diagnosis present

## 2022-08-22 DIAGNOSIS — Z6828 Body mass index (BMI) 28.0-28.9, adult: Secondary | ICD-10-CM | POA: Diagnosis not present

## 2022-08-22 LAB — BASIC METABOLIC PANEL
Anion gap: 11 (ref 5–15)
Anion gap: 12 (ref 5–15)
BUN: 11 mg/dL (ref 6–20)
BUN: 10 mg/dL (ref 6–20)
CO2: 21 mmol/L — ABNORMAL LOW (ref 22–32)
CO2: 22 mmol/L (ref 22–32)
Calcium: 9.4 mg/dL (ref 8.9–10.3)
Calcium: 9.3 mg/dL (ref 8.9–10.3)
Chloride: 103 mmol/L (ref 98–111)
Chloride: 103 mmol/L (ref 98–111)
Creatinine, Ser: 1.15 mg/dL — ABNORMAL HIGH (ref 0.44–1.00)
Creatinine, Ser: 1.09 mg/dL — ABNORMAL HIGH (ref 0.44–1.00)
GFR, Estimated: 56 mL/min — ABNORMAL LOW (ref >60)
GFR, Estimated: 59 mL/min — ABNORMAL LOW (ref >60)
Glucose, Bld: 137 mg/dL — ABNORMAL HIGH (ref 70–99)
Glucose, Bld: 148 mg/dL — ABNORMAL HIGH (ref 70–99)
Potassium: 4.7 mmol/L (ref 3.5–5.1)
Potassium: 4.1 mmol/L (ref 3.5–5.1)
Sodium: 135 mmol/L (ref 135–145)
Sodium: 137 mmol/L (ref 135–145)

## 2022-08-22 LAB — GLUCOSE, CAPILLARY
Glucose-Capillary: 112 mg/dL — ABNORMAL HIGH (ref 70–99)
Glucose-Capillary: 119 mg/dL — ABNORMAL HIGH (ref 70–99)
Glucose-Capillary: 121 mg/dL — ABNORMAL HIGH (ref 70–99)
Glucose-Capillary: 128 mg/dL — ABNORMAL HIGH (ref 70–99)
Glucose-Capillary: 131 mg/dL — ABNORMAL HIGH (ref 70–99)
Glucose-Capillary: 132 mg/dL — ABNORMAL HIGH (ref 70–99)
Glucose-Capillary: 141 mg/dL — ABNORMAL HIGH (ref 70–99)
Glucose-Capillary: 143 mg/dL — ABNORMAL HIGH (ref 70–99)
Glucose-Capillary: 150 mg/dL — ABNORMAL HIGH (ref 70–99)
Glucose-Capillary: 151 mg/dL — ABNORMAL HIGH (ref 70–99)
Glucose-Capillary: 180 mg/dL — ABNORMAL HIGH (ref 70–99)

## 2022-08-22 LAB — CBC
HCT: 36.8 % (ref 36.0–46.0)
Hemoglobin: 11.5 g/dL — ABNORMAL LOW (ref 12.0–15.0)
MCH: 22.9 pg — ABNORMAL LOW (ref 26.0–34.0)
MCHC: 31.3 g/dL (ref 30.0–36.0)
MCV: 73.3 fL — ABNORMAL LOW (ref 80.0–100.0)
Platelets: 315 10*3/uL (ref 150–400)
RBC: 5.02 MIL/uL (ref 3.87–5.11)
RDW: 16.1 % — ABNORMAL HIGH (ref 11.5–15.5)
WBC: 12.9 10*3/uL — ABNORMAL HIGH (ref 4.0–10.5)
nRBC: 0 % (ref 0.0–0.2)

## 2022-08-22 LAB — BETA-HYDROXYBUTYRIC ACID: Beta-Hydroxybutyric Acid: 1.7 mmol/L — ABNORMAL HIGH (ref 0.05–0.27)

## 2022-08-22 LAB — LACTIC ACID, PLASMA
Lactic Acid, Venous: 1.6 mmol/L (ref 0.5–1.9)
Lactic Acid, Venous: 1.1 mmol/L (ref 0.5–1.9)

## 2022-08-22 MED ORDER — LACTATED RINGERS IV SOLN: INTRAVENOUS | Status: AC

## 2022-08-22 MED ORDER — INSULIN ASPART 100 UNIT/ML IJ SOLN: 0.0000 [IU] | Freq: Every day | INTRAMUSCULAR | Status: DC

## 2022-08-22 MED ORDER — INSULIN GLARGINE-YFGN 100 UNIT/ML ~~LOC~~ SOLN
25.0000 [IU] | Freq: Every day | SUBCUTANEOUS | Status: DC
  Administered 2022-08-22 – 2022-08-23 (×2): 25 [IU] via SUBCUTANEOUS
  Filled 2022-08-22 (×2): qty 0.25

## 2022-08-22 MED ORDER — INSULIN ASPART 100 UNIT/ML IJ SOLN
0.0000 [IU] | Freq: Three times a day (TID) | INTRAMUSCULAR | Status: DC
  Administered 2022-08-22 (×2): 1 [IU] via SUBCUTANEOUS

## 2022-08-22 NOTE — Evaluation (Signed)
Physical Therapy Evaluation/ Discharge Patient Details Name: [name] MRN: [id number] DOB: [birth year] Today's Date: 08/22/2022  History of Present Illness  57 yo female admitted 5/24 with vominting, weakness and dizzines with DKA. PMHx: DM, HTN, HLD, depression  Clinical Impression  Pt pleasant in dark room without wanting lights on. 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 Pt able to perform all transfers and gait without assist with noted decreased gait speed which pt reports is baseline. Pt with fear of falling despite good balance. Pt at baseline functional level without further need for therapy intervention at this time with pt aware and agreeable, will sign off. Encouraged daily ambulation.        Recommendations for follow up therapy are one component of a multi-disciplinary discharge planning process, led by the attending physician.  Recommendations may be updated based on patient status, additional functional criteria and insurance authorization.  Follow Up Recommendations       Assistance Recommended at Discharge PRN  Patient can return home with the following       Equipment Recommendations None recommended by PT  Recommendations for Other Services       Functional Status Assessment Patient has not had a recent decline in their functional status     Precautions / Restrictions Precautions Precautions: None      Mobility  Bed Mobility               General bed mobility comments: pt standing on arrival and to chair end of session    Transfers Overall transfer level: Independent                      Ambulation/Gait Ambulation/Gait assistance: Modified independent (Device/Increase time) Gait Distance (Feet): 600 Feet Assistive device: None Gait Pattern/deviations: Step-through pattern, Decreased stride length   Gait velocity interpretation: 1.31 - 2.62 ft/sec, indicative of limited community  ambulator   General Gait Details: pt with decreased speed stating she has slowed her gait speed over the last year due to fear of falling without hx of falls. Pt able to perform vertical and horizontal head turns and change of direction but no significant change in gait speed when cued  Stairs            Wheelchair Mobility    Modified Rankin (Stroke Patients Only)       Balance Overall balance assessment: Mild deficits observed, not formally tested                                           Pertinent Vitals/Pain Pain Assessment Pain Assessment: No/denies pain    Home Living Family/patient expects to be discharged to:: Private residence Living Arrangements: Spouse/significant other Available Help at Discharge: Family;Available PRN/intermittently Type of Home: Apartment Home Access: Stairs to enter   Entrance Stairs-Number of Steps: 1   Home Layout: One level Home Equipment: None      Prior Function Prior Level of Function : Independent/Modified Independent                     Hand Dominance        Extremity/Trunk Assessment   Upper Extremity Assessment Upper Extremity Assessment: Overall WFL for tasks assessed    Lower Extremity Assessment Lower Extremity Assessment: Overall WFL for tasks assessed    Cervical / Trunk Assessment  Cervical / Trunk Assessment: Normal  Communication   Communication: No difficulties  Cognition Arousal/Alertness: Awake/alert Behavior During Therapy: Flat affect Overall Cognitive Status: Within Functional Limits for tasks assessed                                          General Comments General comments (skin integrity, edema, etc.): pt able to pick object off floor, perform 8 toe taps in <30 sec, turn 360 degrees bil <5 sec    Exercises     Assessment/Plan    PT Assessment Patient does not need any further PT services  PT Problem List         PT Treatment Interventions       PT Goals (Current goals can be found in the Care Plan section)  Acute Rehab PT Goals PT Goal Formulation: All assessment and education complete, DC therapy    Frequency       Co-evaluation               AM-PAC PT "6 Clicks" Mobility  Outcome Measure Help needed turning from your back to your side while in a flat bed without using bedrails?: None Help needed moving from lying on your back to sitting on the side of a flat bed without using bedrails?: None Help needed moving to and from a bed to a chair (including a wheelchair)?: None Help needed standing up from a chair using your arms (e.g., wheelchair or bedside chair)?: None Help needed to walk in hospital room?: None Help needed climbing 3-5 steps with a railing? : None 6 Click Score: 24    End of Session   Activity Tolerance: Patient tolerated treatment well Patient left: in chair;with call bell/phone within reach;with family/visitor present Nurse Communication: Mobility status PT Visit Diagnosis: Other abnormalities of gait and mobility (R26.89)    Time: 4540-9811 PT Time Calculation (min) (ACUTE ONLY): 16 min   Charges:   PT Evaluation $PT Eval Low Complexity: 1 Low          Aiden Rao P, PT Acute Rehabilitation Services Office: [phone]   Enedina Finner Dione Mccombie 08/22/2022, 1:35 PM

## 2022-08-22 NOTE — Progress Notes (Signed)
PROGRESS NOTE    [name]  WUJ:[id number] DOB: 09-19-1965 DOA: 08/21/2022 PCP: Wilfrid Lund, PA   Brief Narrative:  [name] is a 57 y.o. female with medical history significant of Diabetes, OSA, Obese, Migraine, Depression, HTN, HLD presenting with nausea, vomiting and light headedness.     ED Course: Vital signs in the ED stable.  Lab workup included CMP with potassium normal at 4.7, bicarb 16, gap 22, creatinine elevated to 1.6 baseline 0.7, glucose 252, protein 8.8, T. bili 1.3.  CBC with leukocytosis to 11.9, platelets 418.  Lipase normal.  Lactic acid 3.4 with repeat pending.  Ethanol level negative, ammonia level negative, UDS and urinalysis pending.  Lithium level subtherapeutic.  VBG with pH 7.152 and normal pCO2.  Beta hydroxybutyric acid elevated at greater than 8.  CT head showed no acute abnormality.  Patient started on insulin drip, given 2 L of fluids and started on a rate of fluids, also received 20 mill equivalents IV potassium in the ED.  Assessment & Plan:   DKA in the setting of uncontrolled type II diabetes: -Likely in the setting of SGLT2 inhibitor use. -Patient started on insulin drip and IV fluids and 20 mEq of IV potassium in ED. -Gap is closed.  Lactic acidosis resolved.  Beta-hydroxybutyrate acid improving. -Currently on Semglee 25 units daily, NovoLog 0 to 9 units 3 times daily with meals and 0 to 5 units at bedtime -Will continue to monitor blood sugar closely.  Appreciate diabetes coordinators help -Will hold on Jardiance at the time of discharge  AKI: -Renal function improving.  Will continue IV fluids.  Avoid nephrotoxic medications.  Depression: -Recently seen by new psychiatrist and lithium was discontinued that was prescribed by her PCP. -Per patient her psychiatrist recommended to continue Abilify.  Hypertension: Stable: -Lisinopril on hold due to AKI  Hyperlipidemia: On statin   DVT prophylaxis: Lovenox Code  Status: Full code Family Communication: Patient's husband present at bedside.  Plan of care discussed with patient in length and she verbalized understanding and agreed with it. Disposition Plan: Likely home tomorrow  Consultants:  None  Procedures:  None  Antimicrobials:  None  Status is: Inpatient   Subjective: Patient seen and examined.  Sitting comfortably on the bed eating breakfast.  Husband at the bedside.  Overall feels better.  No new complaint.  Husband is concerned about interactions of lithium with current diabetic medications.  Remained afebrile.  No acute events overnight.  Objective: Vitals:   08/21/22 2306 08/22/22 0335 08/22/22 0716 08/22/22 1054  BP: 114/71 110/72 119/71 119/68  Pulse: 100 87 95 91  Resp: 17 17 20 12   Temp: 98.4 F (36.9 C) 98.2 F (36.8 C) 98.5 F (36.9 C) 98.7 F (37.1 C)  TempSrc: Oral Oral Oral Oral  SpO2: 95% 96% 96% 99%  Weight:      Height:        Intake/Output Summary (Last 24 hours) at 08/22/2022 1237 Last data filed at 08/22/2022 0500 Gross per 24 hour  Intake 2000 ml  Output 1300 ml  Net 700 ml   Filed Weights   08/21/22 1850 08/21/22 2011  Weight: 73.9 kg 71.3 kg    Examination:  General exam: Appears calm and comfortable, on room air, eating breakfast, slow to respond Respiratory system: Clear to auscultation. Respiratory effort normal. Cardiovascular system: S1 & S2 heard, RRR. No JVD, murmurs, rubs, gallops or clicks. No pedal edema. Gastrointestinal system: Abdomen is nondistended, soft and nontender. No organomegaly  or masses felt. Normal bowel sounds heard. Central nervous system: Alert and oriented. No focal neurological deficits. Extremities: Symmetric 5 x 5 power. Skin: No rashes, lesions or ulcers Psychiatry: Flat affect  Data Reviewed: I have personally reviewed following labs and imaging studies  CBC: Recent Labs  Lab 08/21/22 1241 08/21/22 1609 08/22/22 0352  WBC 11.9*  --  12.9*  HGB 13.2  16.7* 11.5*  HCT 45.1 49.0* 36.8  MCV 76.1*  --  73.3*  PLT 418*  --  315   Basic Metabolic Panel: Recent Labs  Lab 08/21/22 1241 08/21/22 1609 08/21/22 2136 08/22/22 0352 08/22/22 0601  NA 136 136 136 135 137  K 4.7 4.6 4.7 4.7 4.1  CL 98  --  103 103 103  CO2 16*  --  14* 21* 22  GLUCOSE 252*  --  187* 137* 148*  BUN 17  --  16 11 10   CREATININE 1.66*  --  1.44* 1.15* 1.09*  CALCIUM 10.0  --  9.4 9.4 9.3   GFR: Estimated Creatinine Clearance: 52.7 mL/min (A) (by C-G formula based on SCr of 1.09 mg/dL (H)). Liver Function Tests: Recent Labs  Lab 08/21/22 1241  AST 29  ALT 24  ALKPHOS 90  BILITOT 1.3*  PROT 8.8*  ALBUMIN 4.1   Recent Labs  Lab 08/21/22 1241  LIPASE 24   Recent Labs  Lab 08/21/22 1246  AMMONIA 12   Coagulation Profile: No results for input(s): "INR", "PROTIME" in the last 168 hours. Cardiac Enzymes: No results for input(s): "CKTOTAL", "CKMB", "CKMBINDEX", "TROPONINI" in the last 168 hours. BNP (last 3 results) No results for input(s): "PROBNP" in the last 8760 hours. HbA1C: No results for input(s): "HGBA1C" in the last 72 hours. CBG: Recent Labs  Lab 08/22/22 0416 08/22/22 0518 08/22/22 0617 08/22/22 0702 08/22/22 1104  GLUCAP 128* 132* 143* 112* 121*   Lipid Profile: No results for input(s): "CHOL", "HDL", "LDLCALC", "TRIG", "CHOLHDL", "LDLDIRECT" in the last 72 hours. Thyroid Function Tests: No results for input(s): "TSH", "T4TOTAL", "FREET4", "T3FREE", "THYROIDAB" in the last 72 hours. Anemia Panel: No results for input(s): "VITAMINB12", "FOLATE", "FERRITIN", "TIBC", "IRON", "RETICCTPCT" in the last 72 hours. Sepsis Labs: Recent Labs  Lab 08/21/22 1613 08/21/22 1946 08/22/22 0812 08/22/22 1020  LATICACIDVEN 3.4* 2.3* 1.6 1.1    Recent Results (from the past 240 hour(s))  MRSA Next Gen by PCR, Nasal     Status: None   Collection Time: 08/21/22  9:12 PM   Specimen: Nasal Mucosa; Nasal Swab  Result Value Ref Range  Status   MRSA by PCR Next Gen NOT DETECTED NOT DETECTED Final    Comment: (NOTE) The GeneXpert MRSA Assay (FDA approved for NASAL specimens only), is one component of a comprehensive MRSA colonization surveillance program. It is not intended to diagnose MRSA infection nor to guide or monitor treatment for MRSA infections. Test performance is not FDA approved in patients less than 72 years old. Performed at [facility], [address] [place], [place] [address]       Radiology Studies: CT Head Wo Contrast  Result Date: 08/21/2022 CLINICAL DATA:  Mental status change, unknown cause. Weakness and fatigue. EXAM: CT HEAD WITHOUT CONTRAST TECHNIQUE: Contiguous axial images were obtained from the base of the skull through the vertex without intravenous contrast. RADIATION DOSE REDUCTION: This exam was performed according to the departmental dose-optimization program which includes automated exposure control, adjustment of the mA and/or kV according to patient size and/or use of iterative  reconstruction technique. COMPARISON:  Head MRI 05/18/2020 FINDINGS: Brain: There is no evidence of an acute infarct, intracranial hemorrhage, mass, midline shift, or extra-axial fluid collection. The ventricles and sulci are normal. A partially empty sella is unchanged. Vascular: No hyperdense vessel. Skull: No fracture or suspicious osseous lesion. Sinuses/Orbits: Paranasal sinuses and mastoid air cells are clear. Unremarkable orbits. Other: None. IMPRESSION: No evidence of acute intracranial abnormality. Electronically Signed   By: Sebastian Ache M.D.   On: 08/21/2022 14:43    Scheduled Meds:  ARIPiprazole  5 mg Oral QHS   enoxaparin (LOVENOX) injection  40 mg Subcutaneous Q24H   insulin aspart  0-5 Units Subcutaneous QHS   insulin aspart  0-9 Units Subcutaneous TID WC   insulin glargine-yfgn  25 Units Subcutaneous Daily   pravastatin  40 mg Oral Daily   Continuous Infusions:  lactated ringers 100 mL/hr  at 08/22/22 0629     LOS: 0 days   Time spent: 35 minutes   Nichelle Renwick Estill Cotta, MD [facility]  If 7PM-7AM, please contact night-coverage [email or web address] 08/22/2022, 12:37 PM

## 2022-08-23 DIAGNOSIS — E1110 Type 2 diabetes mellitus with ketoacidosis without coma: Secondary | ICD-10-CM | POA: Diagnosis not present

## 2022-08-23 LAB — CBC
HCT: 32 % — ABNORMAL LOW (ref 36.0–46.0)
Hemoglobin: 9.9 g/dL — ABNORMAL LOW (ref 12.0–15.0)
MCH: 22.5 pg — ABNORMAL LOW (ref 26.0–34.0)
MCHC: 30.9 g/dL (ref 30.0–36.0)
MCV: 72.7 fL — ABNORMAL LOW (ref 80.0–100.0)
Platelets: 274 10*3/uL (ref 150–400)
RBC: 4.4 MIL/uL (ref 3.87–5.11)
RDW: 15.8 % — ABNORMAL HIGH (ref 11.5–15.5)
WBC: 9.4 10*3/uL (ref 4.0–10.5)
nRBC: 0 % (ref 0.0–0.2)

## 2022-08-23 LAB — BASIC METABOLIC PANEL
Anion gap: 7 (ref 5–15)
BUN: 8 mg/dL (ref 6–20)
CO2: 27 mmol/L (ref 22–32)
Calcium: 8.7 mg/dL — ABNORMAL LOW (ref 8.9–10.3)
Chloride: 103 mmol/L (ref 98–111)
Creatinine, Ser: 0.91 mg/dL (ref 0.44–1.00)
GFR, Estimated: >60 mL/min (ref >60)
Glucose, Bld: 161 mg/dL — ABNORMAL HIGH (ref 70–99)
Potassium: 3.2 mmol/L — ABNORMAL LOW (ref 3.5–5.1)
Sodium: 137 mmol/L (ref 135–145)

## 2022-08-23 LAB — GLUCOSE, CAPILLARY: Glucose-Capillary: 112 mg/dL — ABNORMAL HIGH (ref 70–99)

## 2022-08-23 LAB — BETA-HYDROXYBUTYRIC ACID: Beta-Hydroxybutyric Acid: 0.75 mmol/L — ABNORMAL HIGH (ref 0.05–0.27)

## 2022-08-23 LAB — MAGNESIUM: Magnesium: 1.9 mg/dL (ref 1.7–2.4)

## 2022-08-23 MED ORDER — POTASSIUM CHLORIDE 20 MEQ PO PACK
40.0000 meq | PACK | Freq: Two times a day (BID) | ORAL | Status: DC
  Administered 2022-08-23: 40 meq via ORAL
  Filled 2022-08-23: qty 2

## 2022-08-23 NOTE — Discharge Summary (Signed)
Physician Discharge Summary  [name] WUJ:[id number] DOB: [birth year] DOA: 08/21/2022  PCP: Wilfrid Lund, PA  Admit date: 08/21/2022 Discharge date: 08/23/2022  Admitted From: Home Disposition:  Home  Recommendations for Outpatient Follow-up:  Follow up with PCP in 1 week Follow-up on pending A1c result Repeat BMP on follow-up visit Hold on Jardiance until follow-up with PCP Avoid NSAIDs such as naproxen and meloxicam Recommend low-carb diet Continue to follow-up with your psychiatry  Home Health: None Equipment/Devices: None Discharge Condition: Stable CODE STATUS: Full code Diet recommendation: Low-carb diet  Brief/Interim Summary: [name] is a 57 y.o. female with medical history significant of Diabetes, OSA, Obese, Migraine, Depression, HTN, HLD presenting with nausea, vomiting and light headedness.      ED Course: Vital signs in the ED stable.  Lab workup included CMP with potassium normal at 4.7, bicarb 16, gap 22, creatinine elevated to 1.6 baseline 0.7, glucose 252, protein 8.8, T. bili 1.3.  CBC with leukocytosis to 11.9, platelets 418.  Lipase normal.  Lactic acid 3.4 with repeat pending.  Ethanol level negative, ammonia level negative, UDS and urinalysis pending.  Lithium level subtherapeutic.  VBG with pH 7.152 and normal pCO2.  Beta hydroxybutyric acid elevated at greater than 8.  CT head showed no acute abnormality.  Patient started on insulin drip, given 2 L of fluids and started on a rate of fluids, also received 20 mill equivalents IV potassium in the ED.  DKA in the setting of uncontrolled type II diabetes: -Likely in the setting of SGLT2 inhibitor use. -Patient started on insulin drip and IV fluids and 20 mEq of IV potassium in ED. -Gap is closed.  Lactic acidosis resolved.  Beta-hydroxybutyrate acid improving. -Started on on Semglee 25 units daily, NovoLog 0 to 9 units 3 times daily with meals and 0 to 5 units at bedtime -Her symptoms  improved.  Blood glucose levels better.  A1c is pending -Recommend to follow-up with PCP and continue to hold on Jardiance until follow-up with PCP. -Resumed metformin, Ozempic, NovoLog 8 to 16 units twice a day at the time of discharge   AKI: -Resolved.  Recommend to continue to avoid use of NSAIDs  Depression Mood disorder: -Recently seen by new psychiatrist and lithium was discontinued that was prescribed by her PCP. -Per patient her psychiatrist recommended to continue Abilify. -Recommend to follow-up with psychiatry outpatient   Hypertension:  -BP stable.  Resumed lisinopril  Hyperlipidemia: Continued statin  Discharge Diagnoses:  DKA AKI Depression/mood disorder Hypertension Hyperlipidemia   Discharge Instructions  Discharge Instructions     Diet Carb Modified   Complete by: As directed    Increase activity slowly   Complete by: As directed       Allergies as of 08/23/2022       Reactions   Prozac [fluoxetine]    Pt does not remember reaction    Victoza [liraglutide] Other (See Comments)   Swelling, painful at injection site         Medication List     STOP taking these medications    Emgality 120 MG/ML Soaj Generic drug: Galcanezumab-gnlm   Jardiance 25 MG Tabs tablet Generic drug: empagliflozin   meloxicam 15 MG tablet Commonly known as: MOBIC   naproxen 500 MG tablet Commonly known as: NAPROSYN       TAKE these medications    Aimovig 70 MG/ML Soaj Generic drug: Erenumab-aooe INJECT 1 PEN UNDER THE SKIN ONCE A MONTH   ARIPiprazole 10 MG tablet  Commonly known as: ABILIFY Take 15 mg by mouth daily.   aspirin 81 MG tablet Take 81 mg by mouth daily.   cholecalciferol 25 MCG (1000 UNIT) tablet Commonly known as: VITAMIN D3 Take 1,000 Units by mouth daily.   cyanocobalamin 1000 MCG tablet Commonly known as: VITAMIN B12 Take 1 tablet (1,000 mcg total) by mouth daily.   DULoxetine 30 MG capsule Commonly known as: CYMBALTA Take  30 mg by mouth daily.   FreeStyle [place] 2 Reader [employer] as directed   [employer] 2 Sensor Misc Apply 1 sensor every 14 days   glucose blood test strip Commonly known as: Chartered loss adjuster Use as instructed   OneTouch Verio test strip Generic drug: glucose blood Use to check blood sugar as directed   OneTouch Verio test strip Generic drug: glucose blood Use as directed to check blood sugars daily   Contour Next Test test strip Generic drug: glucose blood Use to test blood sugar daily   lisinopril 10 MG tablet Commonly known as: ZESTRIL TAKE 1 TABLET (10 MG TOTAL) BY MOUTH DAILY.   metFORMIN 500 MG 24 hr tablet Commonly known as: GLUCOPHAGE-XR TAKE 4 TABLETS BY MOUTH DAILY WITH SUPPER. What changed: See the new instructions.   NovoLOG FlexPen 100 UNIT/ML FlexPen Generic drug: insulin aspart Inject 8-16 Units into the skin 3 (three) times daily with meals.   ondansetron 8 MG disintegrating tablet Commonly known as: ZOFRAN-ODT DISSOLVE 1 TABLET ON THE TONGUE EVERY 8 HOURS FOR 5 DAYS   OneTouch Delica Plus Lancet33G Misc Use as directed to check sugars daily   Microlet Lancets Misc Use to test blood sugar daily   OneTouch Verio Flex System w/Device Kit Use as directed to check sugars daily   Contour Next Monitor w/Device Kit Use to test blood sugar daily   Ozempic (0.25 or 0.5 MG/DOSE) 2 MG/1.5ML Sopn Generic drug: Semaglutide(0.25 or 0.5MG /DOS) Inject 0.5 mg subcutaneously once a week   rosuvastatin 20 MG tablet Commonly known as: CRESTOR Take 20 mg by mouth every evening.   Ubrelvy 100 MG Tabs Generic drug: Ubrogepant Take 1 tablet by mouth as needed for migraine, may take another tablet at least 2 hours after first dose.        Allergies  Allergen Reactions   Prozac [Fluoxetine]     Pt does not remember reaction    Victoza [Liraglutide] Other (See Comments)    Swelling, painful at injection site      Consultations: None   Procedures/Studies: CT Head Wo Contrast  Result Date: 08/21/2022 CLINICAL DATA:  Mental status change, unknown cause. Weakness and fatigue. EXAM: CT HEAD WITHOUT CONTRAST TECHNIQUE: Contiguous axial images were obtained from the base of the skull through the vertex without intravenous contrast. RADIATION DOSE REDUCTION: This exam was performed according to the departmental dose-optimization program which includes automated exposure control, adjustment of the mA and/or kV according to patient size and/or use of iterative reconstruction technique. COMPARISON:  Head MRI 05/18/2020 FINDINGS: Brain: There is no evidence of an acute infarct, intracranial hemorrhage, mass, midline shift, or extra-axial fluid collection. The ventricles and sulci are normal. A partially empty sella is unchanged. Vascular: No hyperdense vessel. Skull: No fracture or suspicious osseous lesion. Sinuses/Orbits: Paranasal sinuses and mastoid air cells are clear. Unremarkable orbits. Other: None. IMPRESSION: No evidence of acute intracranial abnormality. Electronically Signed   By: Sebastian Ache M.D.   On: 08/21/2022 14:43      Subjective: Patient seen and examined.  Sitting comfortably on  the bed.  Eating breakfast.  No new complaints.  Comfortable going home today.  Discharge Exam: Vitals:   08/23/22 0323 08/23/22 0808  BP: 105/76 117/71  Pulse:  86  Resp: 20 18  Temp: 98.1 F (36.7 C) 98.2 F (36.8 C)  SpO2: 97% 98%   Vitals:   08/22/22 1928 08/22/22 2308 08/23/22 0323 08/23/22 0808  BP: 106/63 101/63 105/76 117/71  Pulse: 84   86  Resp: 14 17 20 18   Temp: 98.7 F (37.1 C) 98.5 F (36.9 C) 98.1 F (36.7 C) 98.2 F (36.8 C)  TempSrc: Oral Oral Oral Oral  SpO2: 97% 96% 97% 98%  Weight:      Height:        General: Pt is alert, awake, not in acute distress, on room air, communicating well, eating breakfast Cardiovascular: RRR, S1/S2 +, no rubs, no gallops Respiratory: CTA  bilaterally, no wheezing, no rhonchi Abdominal: Soft, NT, ND, bowel sounds + Extremities: no edema, no cyanosis    The results of significant diagnostics from this hospitalization (including imaging, microbiology, ancillary and laboratory) are listed below for reference.     Microbiology: Recent Results (from the past 240 hour(s))  MRSA Next Gen by PCR, Nasal     Status: None   Collection Time: 08/21/22  9:12 PM   Specimen: Nasal Mucosa; Nasal Swab  Result Value Ref Range Status   MRSA by PCR Next Gen NOT DETECTED NOT DETECTED Final    Comment: (NOTE) The GeneXpert MRSA Assay (FDA approved for NASAL specimens only), is one component of a comprehensive MRSA colonization surveillance program. It is not intended to diagnose MRSA infection nor to guide or monitor treatment for MRSA infections. Test performance is not FDA approved in patients less than 42 years old. Performed at [facility], [address], [place], [place] [address]      Labs: BNP (last 3 results) No results for input(s): "BNP" in the last 8760 hours. Basic Metabolic Panel: Recent Labs  Lab 08/21/22 1241 08/21/22 1609 08/21/22 2136 08/22/22 0352 08/22/22 0601 08/23/22 0016  NA 136 136 136 135 137 137  K 4.7 4.6 4.7 4.7 4.1 3.2*  CL 98  --  103 103 103 103  CO2 16*  --  14* 21* 22 27  GLUCOSE 252*  --  187* 137* 148* 161*  BUN 17  --  16 11 10 8   CREATININE 1.66*  --  1.44* 1.15* 1.09* 0.91  CALCIUM 10.0  --  9.4 9.4 9.3 8.7*  MG  --   --   --   --   --  1.9   Liver Function Tests: Recent Labs  Lab 08/21/22 1241  AST 29  ALT 24  ALKPHOS 90  BILITOT 1.3*  PROT 8.8*  ALBUMIN 4.1   Recent Labs  Lab 08/21/22 1241  LIPASE 24   Recent Labs  Lab 08/21/22 1246  AMMONIA 12   CBC: Recent Labs  Lab 08/21/22 1241 08/21/22 1609 08/22/22 0352 08/23/22 0016  WBC 11.9*  --  12.9* 9.4  HGB 13.2 16.7* 11.5* 9.9*  HCT 45.1 49.0* 36.8 32.0*  MCV 76.1*  --  73.3* 72.7*  PLT 418*  --  315  274   Cardiac Enzymes: No results for input(s): "CKTOTAL", "CKMB", "CKMBINDEX", "TROPONINI" in the last 168 hours. BNP: Invalid input(s): "POCBNP" CBG: Recent Labs  Lab 08/22/22 0702 08/22/22 1104 08/22/22 1623 08/22/22 2106 08/23/22 0601  GLUCAP 112* 121* 150* 180* 112*   D-Dimer  No results for input(s): "DDIMER" in the last 72 hours. Hgb A1c No results for input(s): "HGBA1C" in the last 72 hours. Lipid Profile No results for input(s): "CHOL", "HDL", "LDLCALC", "TRIG", "CHOLHDL", "LDLDIRECT" in the last 72 hours. Thyroid function studies No results for input(s): "TSH", "T4TOTAL", "T3FREE", "THYROIDAB" in the last 72 hours.  Invalid input(s): "FREET3" Anemia work up No results for input(s): "VITAMINB12", "FOLATE", "FERRITIN", "TIBC", "IRON", "RETICCTPCT" in the last 72 hours. Urinalysis    Component Value Date/Time   COLORURINE YELLOW 08/21/2022 1614   APPEARANCEUR CLEAR 08/21/2022 1614   LABSPEC 1.021 08/21/2022 1614   PHURINE 5.0 08/21/2022 1614   GLUCOSEU >=500 (A) 08/21/2022 1614   GLUCOSEU NEGATIVE 02/11/2015 0805   HGBUR NEGATIVE 08/21/2022 1614   HGBUR trace-intact 07/06/2007 1428   BILIRUBINUR NEGATIVE 08/21/2022 1614   KETONESUR 80 (A) 08/21/2022 1614   PROTEINUR NEGATIVE 08/21/2022 1614   UROBILINOGEN 0.2 02/11/2015 0805   NITRITE NEGATIVE 08/21/2022 1614   LEUKOCYTESUR NEGATIVE 08/21/2022 1614   Sepsis Labs Recent Labs  Lab 08/21/22 1241 08/22/22 0352 08/23/22 0016  WBC 11.9* 12.9* 9.4   Microbiology Recent Results (from the past 240 hour(s))  MRSA Next Gen by PCR, Nasal     Status: None   Collection Time: 08/21/22  9:12 PM   Specimen: Nasal Mucosa; Nasal Swab  Result Value Ref Range Status   MRSA by PCR Next Gen NOT DETECTED NOT DETECTED Final    Comment: (NOTE) The GeneXpert MRSA Assay (FDA approved for NASAL specimens only), is one component of a comprehensive MRSA colonization surveillance program. It is not intended to diagnose MRSA  infection nor to guide or monitor treatment for MRSA infections. Test performance is not FDA approved in patients less than 4 years old. Performed at [facility], [address] [place], [place] [address]      Time coordinating discharge: Over 30 minutes  SIGNED:   Ollen Bowl, MD  [facility] 08/23/2022, 9:26 AM Pager   If 7PM-7AM, please contact night-coverage [email or web address]

## 2022-08-23 NOTE — Progress Notes (Signed)
Currently no CPAP use. 4/9 Sleep study resulted in no indication for intervention. New study pending. Will continue to assess.

## 2022-08-25 DIAGNOSIS — M25512 Pain in left shoulder: Secondary | ICD-10-CM | POA: Diagnosis not present

## 2022-08-25 LAB — HEMOGLOBIN A1C
Hgb A1c MFr Bld: 9.4 % — ABNORMAL HIGH (ref 4.8–5.6)
Mean Plasma Glucose: 223 mg/dL

## 2022-09-03 DIAGNOSIS — M25512 Pain in left shoulder: Secondary | ICD-10-CM | POA: Diagnosis not present

## 2022-09-04 DIAGNOSIS — E1165 Type 2 diabetes mellitus with hyperglycemia: Secondary | ICD-10-CM | POA: Diagnosis not present

## 2022-09-04 DIAGNOSIS — N179 Acute kidney failure, unspecified: Secondary | ICD-10-CM | POA: Diagnosis not present

## 2022-09-04 DIAGNOSIS — E1169 Type 2 diabetes mellitus with other specified complication: Secondary | ICD-10-CM | POA: Diagnosis not present

## 2022-09-04 DIAGNOSIS — F322 Major depressive disorder, single episode, severe without psychotic features: Secondary | ICD-10-CM | POA: Diagnosis not present

## 2022-09-09 DIAGNOSIS — F332 Major depressive disorder, recurrent severe without psychotic features: Secondary | ICD-10-CM | POA: Diagnosis not present

## 2022-09-11 DIAGNOSIS — M25512 Pain in left shoulder: Secondary | ICD-10-CM | POA: Diagnosis not present

## 2022-09-22 DIAGNOSIS — E785 Hyperlipidemia, unspecified: Secondary | ICD-10-CM | POA: Diagnosis not present

## 2022-09-22 DIAGNOSIS — E1165 Type 2 diabetes mellitus with hyperglycemia: Secondary | ICD-10-CM | POA: Diagnosis not present

## 2022-09-22 DIAGNOSIS — I10 Essential (primary) hypertension: Secondary | ICD-10-CM | POA: Diagnosis not present

## 2022-09-23 DIAGNOSIS — F332 Major depressive disorder, recurrent severe without psychotic features: Secondary | ICD-10-CM | POA: Diagnosis not present

## 2022-09-23 DIAGNOSIS — F411 Generalized anxiety disorder: Secondary | ICD-10-CM | POA: Diagnosis not present

## 2022-09-24 DIAGNOSIS — Z1231 Encounter for screening mammogram for malignant neoplasm of breast: Secondary | ICD-10-CM | POA: Diagnosis not present

## 2022-11-24 DIAGNOSIS — E1165 Type 2 diabetes mellitus with hyperglycemia: Secondary | ICD-10-CM | POA: Diagnosis not present

## 2022-12-01 DIAGNOSIS — E1165 Type 2 diabetes mellitus with hyperglycemia: Secondary | ICD-10-CM | POA: Diagnosis not present

## 2022-12-01 DIAGNOSIS — E785 Hyperlipidemia, unspecified: Secondary | ICD-10-CM | POA: Diagnosis not present

## 2022-12-01 DIAGNOSIS — I10 Essential (primary) hypertension: Secondary | ICD-10-CM | POA: Diagnosis not present

## 2023-03-02 DIAGNOSIS — E1165 Type 2 diabetes mellitus with hyperglycemia: Secondary | ICD-10-CM | POA: Diagnosis not present

## 2023-03-09 DIAGNOSIS — I10 Essential (primary) hypertension: Secondary | ICD-10-CM | POA: Diagnosis not present

## 2023-03-09 DIAGNOSIS — E785 Hyperlipidemia, unspecified: Secondary | ICD-10-CM | POA: Diagnosis not present

## 2023-03-09 DIAGNOSIS — E1165 Type 2 diabetes mellitus with hyperglycemia: Secondary | ICD-10-CM | POA: Diagnosis not present

## 2023-04-23 DIAGNOSIS — Z0000 Encounter for general adult medical examination without abnormal findings: Secondary | ICD-10-CM | POA: Diagnosis not present

## 2023-04-23 DIAGNOSIS — E785 Hyperlipidemia, unspecified: Secondary | ICD-10-CM | POA: Diagnosis not present

## 2023-04-23 DIAGNOSIS — E559 Vitamin D deficiency, unspecified: Secondary | ICD-10-CM | POA: Diagnosis not present

## 2023-04-23 DIAGNOSIS — I10 Essential (primary) hypertension: Secondary | ICD-10-CM | POA: Diagnosis not present

## 2023-04-23 DIAGNOSIS — R079 Chest pain, unspecified: Secondary | ICD-10-CM | POA: Diagnosis not present

## 2023-04-23 DIAGNOSIS — E1169 Type 2 diabetes mellitus with other specified complication: Secondary | ICD-10-CM | POA: Diagnosis not present

## 2023-05-31 DIAGNOSIS — E1165 Type 2 diabetes mellitus with hyperglycemia: Secondary | ICD-10-CM | POA: Diagnosis not present

## 2023-06-07 ENCOUNTER — Other Ambulatory Visit (HOSPITAL_COMMUNITY): Payer: Self-pay

## 2023-06-07 DIAGNOSIS — G8929 Other chronic pain: Secondary | ICD-10-CM | POA: Diagnosis not present

## 2023-06-07 DIAGNOSIS — M797 Fibromyalgia: Secondary | ICD-10-CM | POA: Diagnosis not present

## 2023-06-07 DIAGNOSIS — M545 Low back pain, unspecified: Secondary | ICD-10-CM | POA: Diagnosis not present

## 2023-06-07 DIAGNOSIS — E1169 Type 2 diabetes mellitus with other specified complication: Secondary | ICD-10-CM | POA: Diagnosis not present

## 2023-06-07 MED ORDER — MELOXICAM 7.5 MG PO TABS
7.5000 mg | ORAL_TABLET | Freq: Two times a day (BID) | ORAL | 0 refills | Status: AC
  Filled 2023-06-07: qty 28, 14d supply, fill #0

## 2023-06-07 MED ORDER — GABAPENTIN 100 MG PO CAPS
100.0000 mg | ORAL_CAPSULE | Freq: Two times a day (BID) | ORAL | 0 refills | Status: DC
  Filled 2023-06-07: qty 28, 14d supply, fill #0

## 2023-06-08 DIAGNOSIS — H2513 Age-related nuclear cataract, bilateral: Secondary | ICD-10-CM | POA: Diagnosis not present

## 2023-06-08 DIAGNOSIS — H1045 Other chronic allergic conjunctivitis: Secondary | ICD-10-CM | POA: Diagnosis not present

## 2023-06-08 DIAGNOSIS — E119 Type 2 diabetes mellitus without complications: Secondary | ICD-10-CM | POA: Diagnosis not present

## 2023-06-08 DIAGNOSIS — H04123 Dry eye syndrome of bilateral lacrimal glands: Secondary | ICD-10-CM | POA: Diagnosis not present

## 2023-06-14 ENCOUNTER — Other Ambulatory Visit (HOSPITAL_COMMUNITY): Payer: Self-pay

## 2023-06-14 DIAGNOSIS — I10 Essential (primary) hypertension: Secondary | ICD-10-CM | POA: Diagnosis not present

## 2023-06-14 DIAGNOSIS — E1165 Type 2 diabetes mellitus with hyperglycemia: Secondary | ICD-10-CM | POA: Diagnosis not present

## 2023-06-14 DIAGNOSIS — E785 Hyperlipidemia, unspecified: Secondary | ICD-10-CM | POA: Diagnosis not present

## 2023-06-14 MED ORDER — MOUNJARO 2.5 MG/0.5ML ~~LOC~~ SOAJ
2.5000 mg | SUBCUTANEOUS | 1 refills | Status: DC
  Filled 2023-06-14: qty 2, 28d supply, fill #0

## 2023-06-18 ENCOUNTER — Other Ambulatory Visit (HOSPITAL_COMMUNITY): Payer: Self-pay

## 2023-06-21 ENCOUNTER — Other Ambulatory Visit (HOSPITAL_COMMUNITY): Payer: Self-pay

## 2023-07-12 ENCOUNTER — Other Ambulatory Visit (HOSPITAL_COMMUNITY): Payer: Self-pay

## 2023-07-12 ENCOUNTER — Other Ambulatory Visit: Payer: Self-pay | Admitting: Family Medicine

## 2023-07-12 DIAGNOSIS — E785 Hyperlipidemia, unspecified: Secondary | ICD-10-CM | POA: Diagnosis not present

## 2023-07-12 DIAGNOSIS — E1165 Type 2 diabetes mellitus with hyperglycemia: Secondary | ICD-10-CM | POA: Diagnosis not present

## 2023-07-12 DIAGNOSIS — E049 Nontoxic goiter, unspecified: Secondary | ICD-10-CM

## 2023-07-12 DIAGNOSIS — R400 Somnolence: Secondary | ICD-10-CM | POA: Diagnosis not present

## 2023-07-12 DIAGNOSIS — I10 Essential (primary) hypertension: Secondary | ICD-10-CM | POA: Diagnosis not present

## 2023-07-12 MED ORDER — ROSUVASTATIN CALCIUM 20 MG PO TABS
20.0000 mg | ORAL_TABLET | Freq: Every day | ORAL | 1 refills | Status: AC
  Filled 2023-07-12: qty 90, 90d supply, fill #0

## 2023-07-12 MED ORDER — LISINOPRIL 20 MG PO TABS
10.0000 mg | ORAL_TABLET | Freq: Every day | ORAL | 1 refills | Status: AC
  Filled 2023-07-12: qty 45, 90d supply, fill #0
  Filled 2024-03-31: qty 45, 90d supply, fill #1

## 2023-07-20 ENCOUNTER — Ambulatory Visit
Admission: RE | Admit: 2023-07-20 | Discharge: 2023-07-20 | Disposition: A | Discharge status: Home / Self Care | Source: Ambulatory Visit | Attending: Family Medicine | Admitting: Family Medicine

## 2023-07-20 DIAGNOSIS — E049 Nontoxic goiter, unspecified: Secondary | ICD-10-CM | POA: Diagnosis not present

## 2023-07-26 ENCOUNTER — Other Ambulatory Visit (HOSPITAL_COMMUNITY): Payer: Self-pay

## 2023-07-26 DIAGNOSIS — E1165 Type 2 diabetes mellitus with hyperglycemia: Secondary | ICD-10-CM | POA: Diagnosis not present

## 2023-07-26 DIAGNOSIS — E785 Hyperlipidemia, unspecified: Secondary | ICD-10-CM | POA: Diagnosis not present

## 2023-07-26 DIAGNOSIS — I10 Essential (primary) hypertension: Secondary | ICD-10-CM | POA: Diagnosis not present

## 2023-07-26 MED ORDER — FREESTYLE LIBRE 3 SENSOR MISC
11 refills | Status: AC
  Filled 2023-07-26: qty 2, 30d supply, fill #0
  Filled 2024-03-31: qty 2, 30d supply, fill #1
  Filled 2024-05-03: qty 2, 30d supply, fill #2

## 2023-07-26 MED ORDER — MOUNJARO 5 MG/0.5ML ~~LOC~~ SOAJ
5.0000 mg | SUBCUTANEOUS | 1 refills | Status: DC
  Filled 2023-07-26: qty 2, 28d supply, fill #0
  Filled 2023-11-25: qty 2, 28d supply, fill #1

## 2023-07-27 ENCOUNTER — Other Ambulatory Visit (HOSPITAL_COMMUNITY): Payer: Self-pay

## 2023-07-27 ENCOUNTER — Encounter: Payer: Self-pay | Admitting: Internal Medicine

## 2023-07-27 ENCOUNTER — Ambulatory Visit: Payer: BC Managed Care – PPO | Attending: Internal Medicine | Admitting: Internal Medicine

## 2023-07-27 VITALS — BP 117/77 | HR 75 | Ht 63.0 in | Wt 162.0 lb

## 2023-07-27 DIAGNOSIS — E7800 Pure hypercholesterolemia, unspecified: Secondary | ICD-10-CM | POA: Diagnosis not present

## 2023-07-27 DIAGNOSIS — R072 Precordial pain: Secondary | ICD-10-CM | POA: Diagnosis not present

## 2023-07-27 DIAGNOSIS — G4730 Sleep apnea, unspecified: Secondary | ICD-10-CM | POA: Diagnosis not present

## 2023-07-27 DIAGNOSIS — I10 Essential (primary) hypertension: Secondary | ICD-10-CM | POA: Diagnosis not present

## 2023-07-27 LAB — BASIC METABOLIC PANEL WITH GFR
BUN/Creatinine Ratio: 14 (ref 9–23)
BUN: 10 mg/dL (ref 6–24)
CO2: 25 mmol/L (ref 20–29)
Calcium: 10.1 mg/dL (ref 8.7–10.2)
Chloride: 104 mmol/L (ref 96–106)
Creatinine, Ser: 0.69 mg/dL (ref 0.57–1.00)
Glucose: 116 mg/dL — ABNORMAL HIGH (ref 70–99)
Potassium: 4.9 mmol/L (ref 3.5–5.2)
Sodium: 140 mmol/L (ref 134–144)
eGFR: 101 mL/min/{1.73_m2} (ref >59)

## 2023-07-27 MED ORDER — METOPROLOL TARTRATE 100 MG PO TABS
100.0000 mg | ORAL_TABLET | Freq: Once | ORAL | 0 refills | Status: AC
  Filled 2023-07-27: qty 1, 1d supply, fill #0

## 2023-07-27 NOTE — Progress Notes (Signed)
 Cardiology Office Note:  .    Date:  07/27/2023  ID:  Nick Barman Smitherman, DOB [birth year], MRN [id number] PCP: Chyrel Craw, NP  [facility] Providers Cardiologist:  None     CC: CP Consulted by Ms. Hester - NP, for chest pain  History of Present Illness: .    [name] is a 58 y.o. female  with hypertension, hyperlipidemia, and type 2 diabetes who presents with chest pain. She was referred by her primary care doctor, Ronie Cohen, for evaluation of chest pain.  She has been experiencing intermittent chest pain for the past couple of years, which has become more frequent recently. The pain is described as uncomfortable and sometimes makes it difficult to breathe. It is primarily located across the chest and sometimes radiates to the upper back. The pain can occur during daily activities such as cooking or washing clothes, and it improves with rest.  Her medical history includes hypertension, hyperlipidemia, and type 2 diabetes. Her blood pressure is well controlled with lisinopril  20 mg, and her LDL cholesterol is maintained under 70 with rosuvastatin  20 mg. She is also on aspirin  81 mg and Mounjaro , which is managed by her primary care doctor.  In terms of family history, her father has congestive heart failure. She also reports a history of sleep apnea, which has improved with weight loss, but she is due for another sleep study to reassess her condition.  During the review of symptoms, she mentions occasional shortness of breath during daily chores, but denies significant leg swelling, although she notes mild ankle swelling at times.  Discussed the use of AI scribe software for clinical note transcription with the patient, who gave verbal consent to proceed.   Relevant histories: .  Social Father had congestive heart failure, unclear etiology ROS: As per HPI.   Studies Reviewed: Aaron Aas    LABS LDL: under 701/24/25  DIAGNOSTIC EKG: Diffuse T wave  inversions (2024) EKG: T wave inversions resolved (07/27/2023)   Physical Exam:    VS:  BP 117/77 (BP Location: Left Arm)   Pulse 75   Ht 5\' 3"  (1.6 m)   Wt 73.5 kg   SpO2 98%   BMI 28.70 kg/m    Wt Readings from Last 3 Encounters:  07/27/23 73.5 kg  08/21/22 71.3 kg  04/30/20 74.4 kg    Gen: no distress   Neck: No JVD Cardiac: No Rubs or Gallops, no Murmur, RRR +2 radial pulses Respiratory: Clear to auscultation bilaterally, normal effort, normal  respiratory rate GI: Soft, nontender, non-distended  MS: Slight ankle swelling edema;  moves all extremities Integument: Skin feels warm Neuro:  At time of evaluation, alert and oriented to person/place/time/situation  Psych: Normal affect, patient feels fair   ASSESSMENT AND PLAN: .    An EKG was ordered for CP and shows resolution of her TWI  Chest pain Intermittent chest pain for a couple of years, sometimes associated with dyspnea. Pain is located across the chest and upper back. Risk factors for coronary artery disease include hypertension, hyperlipidemia, and type 2 diabetes. Previous EKG showed T wave inversions, but current EKG is normal. Differential diagnosis includes obstructive coronary disease. - Order cardiac CT to assess for coronary artery disease and potential blockages. - If cardiac CT is negative and dyspnea persists, order echocardiogram to evaluate cardiac function.  Hypertension Hypertension is well-controlled with lisinopril  20 mg daily.  Hyperlipidemia Hyperlipidemia is well-managed with rosuvastatin  20 mg daily, maintaining LDL levels under 70 mg/dL.  Type 2 diabetes mellitus Type 2 diabetes is managed by primary care NP with improving blood sugar levels on current management, including Mounjaro .  Obstructive sleep apnea Obstructive sleep apnea is improving with weight loss. She reports snoring and has been advised to undergo another sleep study to assess current status.  Four months with  me  Gloriann Larger, MD [facility] Cardiologist [facility]  [address] [place], [address] [place], [place] [address] [phone]  10:16 AM

## 2023-07-27 NOTE — Patient Instructions (Addendum)
 Medication Instructions:  Your physician recommends that you continue on your current medications as directed. Please refer to the Current Medication list given to you today.  *If you need a refill on your cardiac medications before your next appointment, please call your pharmacy*  Lab Work: Bmet today  If you have labs (blood work) drawn today and your tests are completely normal, you will receive your results only by: MyChart Message (if you have MyChart) OR A paper copy in the mail If you have any lab test that is abnormal or we need to change your treatment, we will call you to review the results.  Testing/Procedures: Your physician has requested that you have cardiac CT. Cardiac computed tomography (CT) is a painless test that uses an x-ray machine to take clear, detailed pictures of your heart. For further information please visit [email or web address] Please follow instruction sheet as given.    Follow-Up: At [facility], you and your health needs are our priority.  As part of our continuing mission to provide you with exceptional heart care, our providers are all part of one team.  This team includes your primary Cardiologist (physician) and Advanced Practice Providers or APPs (Physician Assistants and Nurse Practitioners) who all work together to provide you with the care you need, when you need it.  Your next appointment:   4 month(s)  Provider:   Gloriann Larger, MD     Your cardiac CT will be scheduled at one of the below locations:   [facility] [address] [place], [place] [address] [phone]  OR  Jeralene Mom. [facility] and Vascular Tower [address]  [place], [place] [address] Opening July 26, 2023  If scheduled at [facility], please arrive at the [facility] and Children's Entrance (Entrance C2) of [facility] 30 minutes prior to test start time. You can use the FREE valet parking offered at entrance C  (encouraged to control the heart rate for the test)  Proceed to the [facility] Radiology Department (first floor) to check-in and test prep.   All radiology patients and guests should use entrance C2 at [facility], accessed from [facility], even though the hospital's physical address listed is [address].    If scheduled at the Heart and Vascular Tower at [address], please enter the parking lot using the [address] entrance and use the FREE valet service at the patient drop-off area. Enter the buidling and check-in with registration on the main floor.  Please follow these instructions carefully (unless otherwise directed):  An IV will be required for this test and Nitroglycerin will be given.   On the Night Before the Test: Be sure to Drink plenty of water . Do not consume any caffeinated/decaffeinated beverages or chocolate 12 hours prior to your test. Do not take any antihistamines 12 hours prior to your test.  On the Day of the Test: Drink plenty of water  until 1 hour prior to the test. Do not eat any food 1 hour prior to test. You may take your regular medications prior to the test.  Take metoprolol (Lopressor) two hours prior to test. Patients who wear a continuous glucose monitor MUST remove the device prior to scanning. FEMALES- please wear underwire-free bra if available, avoid dresses & tight clothing  After the Test: Drink plenty of water . After receiving IV contrast, you may experience a mild flushed feeling. This is normal. On occasion, you may experience a mild rash up  to 24 hours after the test. This is not dangerous. If this occurs, you can take Benadryl 25 mg, Zyrtec, Claritin, or Allegra and increase your fluid intake. (Patients taking Tikosyn should avoid Benadryl, and may take Zyrtec, Claritin, or Allegra) If you experience trouble breathing, this can be serious. If it is severe call 911 IMMEDIATELY. If it is mild, please  call our office.  We will call to schedule your test 2-4 weeks out understanding that some insurance companies will need an authorization prior to the service being performed.   For more information and frequently asked questions, please visit our website : [email or web address]  For non-scheduling related questions, please contact the cardiac imaging nurse navigator should you have any questions/concerns: Cardiac Imaging Nurse Navigators Direct Office Dial: [phone]   For scheduling needs, including cancellations and rescheduling, please call [place], [phone].

## 2023-07-28 ENCOUNTER — Encounter: Payer: Self-pay | Admitting: Internal Medicine

## 2023-07-29 DIAGNOSIS — E049 Nontoxic goiter, unspecified: Secondary | ICD-10-CM | POA: Diagnosis not present

## 2023-07-29 DIAGNOSIS — F319 Bipolar disorder, unspecified: Secondary | ICD-10-CM | POA: Diagnosis not present

## 2023-07-29 DIAGNOSIS — G4733 Obstructive sleep apnea (adult) (pediatric): Secondary | ICD-10-CM | POA: Diagnosis not present

## 2023-08-09 ENCOUNTER — Encounter (HOSPITAL_COMMUNITY): Payer: Self-pay

## 2023-08-10 ENCOUNTER — Telehealth (HOSPITAL_COMMUNITY): Payer: Self-pay | Admitting: *Deleted

## 2023-08-10 NOTE — Telephone Encounter (Signed)
 Attempted to call patient regarding upcoming cardiac CT appointment. Left message on voicemail with name and callback number  Larey Brick RN Navigator Cardiac Imaging [facility] Heart [facility] Vascular Services [phone] Office [phone] Cell

## 2023-08-11 ENCOUNTER — Ambulatory Visit (HOSPITAL_COMMUNITY)
Admission: RE | Admit: 2023-08-11 | Discharge: 2023-08-11 | Disposition: A | Discharge status: Home / Self Care | Source: Ambulatory Visit | Attending: Internal Medicine | Admitting: Internal Medicine

## 2023-08-11 DIAGNOSIS — Z0189 Encounter for other specified special examinations: Secondary | ICD-10-CM | POA: Diagnosis not present

## 2023-08-11 DIAGNOSIS — R072 Precordial pain: Secondary | ICD-10-CM

## 2023-08-11 MED ORDER — IOHEXOL 350 MG/ML SOLN
100.0000 mL | Freq: Once | INTRAVENOUS | Status: AC | PRN
  Administered 2023-08-11: 100 mL via INTRAVENOUS

## 2023-08-11 MED ORDER — NITROGLYCERIN 0.4 MG SL SUBL
0.8000 mg | SUBLINGUAL_TABLET | Freq: Once | SUBLINGUAL | Status: AC
  Administered 2023-08-11: 0.8 mg via SUBLINGUAL

## 2023-08-12 ENCOUNTER — Ambulatory Visit: Payer: Self-pay | Admitting: Internal Medicine

## 2023-08-12 ENCOUNTER — Other Ambulatory Visit: Payer: Self-pay

## 2023-08-12 ENCOUNTER — Other Ambulatory Visit (HOSPITAL_COMMUNITY): Payer: Self-pay

## 2023-08-12 DIAGNOSIS — L239 Allergic contact dermatitis, unspecified cause: Secondary | ICD-10-CM | POA: Diagnosis not present

## 2023-08-12 MED ORDER — LEVOCETIRIZINE DIHYDROCHLORIDE 5 MG PO TABS
5.0000 mg | ORAL_TABLET | Freq: Every evening | ORAL | 2 refills | Status: AC
  Filled 2023-08-12: qty 30, 30d supply, fill #0

## 2023-08-12 MED ORDER — BETAMETHASONE DIPROPIONATE 0.05 % EX CREA
1.0000 | TOPICAL_CREAM | Freq: Two times a day (BID) | CUTANEOUS | 0 refills | Status: AC
  Filled 2023-08-12: qty 90, 45d supply, fill #0

## 2023-08-16 DIAGNOSIS — G4733 Obstructive sleep apnea (adult) (pediatric): Secondary | ICD-10-CM | POA: Diagnosis not present

## 2023-08-24 DIAGNOSIS — G4733 Obstructive sleep apnea (adult) (pediatric): Secondary | ICD-10-CM | POA: Diagnosis not present

## 2023-08-24 DIAGNOSIS — F319 Bipolar disorder, unspecified: Secondary | ICD-10-CM | POA: Diagnosis not present

## 2023-08-24 DIAGNOSIS — E663 Overweight: Secondary | ICD-10-CM | POA: Diagnosis not present

## 2023-09-16 DIAGNOSIS — M79605 Pain in left leg: Secondary | ICD-10-CM | POA: Diagnosis not present

## 2023-10-08 ENCOUNTER — Other Ambulatory Visit (HOSPITAL_COMMUNITY): Payer: Self-pay

## 2023-10-08 DIAGNOSIS — H1031 Unspecified acute conjunctivitis, right eye: Secondary | ICD-10-CM | POA: Diagnosis not present

## 2023-10-08 MED ORDER — POLYMYXIN B-TRIMETHOPRIM 10000-0.1 UNIT/ML-% OP SOLN
1.0000 [drp] | Freq: Four times a day (QID) | OPHTHALMIC | 0 refills | Status: AC
  Filled 2023-10-08: qty 10, 25d supply, fill #0

## 2023-10-11 ENCOUNTER — Other Ambulatory Visit (HOSPITAL_COMMUNITY): Payer: Self-pay

## 2023-10-14 DIAGNOSIS — G4733 Obstructive sleep apnea (adult) (pediatric): Secondary | ICD-10-CM | POA: Diagnosis not present

## 2023-10-21 ENCOUNTER — Other Ambulatory Visit (HOSPITAL_COMMUNITY): Payer: Self-pay

## 2023-10-21 DIAGNOSIS — I10 Essential (primary) hypertension: Secondary | ICD-10-CM | POA: Diagnosis not present

## 2023-10-21 DIAGNOSIS — E1169 Type 2 diabetes mellitus with other specified complication: Secondary | ICD-10-CM | POA: Diagnosis not present

## 2023-10-21 DIAGNOSIS — E785 Hyperlipidemia, unspecified: Secondary | ICD-10-CM | POA: Diagnosis not present

## 2023-10-21 DIAGNOSIS — E559 Vitamin D deficiency, unspecified: Secondary | ICD-10-CM | POA: Diagnosis not present

## 2023-10-21 MED ORDER — BUPROPION HCL ER (XL) 150 MG PO TB24
150.0000 mg | ORAL_TABLET | Freq: Every morning | ORAL | 0 refills | Status: AC
  Filled 2023-10-21: qty 30, 30d supply, fill #0

## 2023-10-25 DIAGNOSIS — E1165 Type 2 diabetes mellitus with hyperglycemia: Secondary | ICD-10-CM | POA: Diagnosis not present

## 2023-11-01 ENCOUNTER — Other Ambulatory Visit (HOSPITAL_COMMUNITY): Payer: Self-pay

## 2023-11-01 DIAGNOSIS — E785 Hyperlipidemia, unspecified: Secondary | ICD-10-CM | POA: Diagnosis not present

## 2023-11-01 DIAGNOSIS — E1165 Type 2 diabetes mellitus with hyperglycemia: Secondary | ICD-10-CM | POA: Diagnosis not present

## 2023-11-01 DIAGNOSIS — I10 Essential (primary) hypertension: Secondary | ICD-10-CM | POA: Diagnosis not present

## 2023-11-01 MED ORDER — LISINOPRIL 5 MG PO TABS
5.0000 mg | ORAL_TABLET | Freq: Every day | ORAL | 5 refills | Status: AC
  Filled 2023-11-01: qty 30, 30d supply, fill #0

## 2023-11-01 MED ORDER — MOUNJARO 5 MG/0.5ML ~~LOC~~ SOAJ
5.0000 mg | SUBCUTANEOUS | 1 refills | Status: AC
  Filled 2023-11-01 – 2024-05-03 (×2): qty 2, 28d supply, fill #0

## 2023-11-11 ENCOUNTER — Other Ambulatory Visit (HOSPITAL_COMMUNITY): Payer: Self-pay

## 2023-11-14 DIAGNOSIS — G4733 Obstructive sleep apnea (adult) (pediatric): Secondary | ICD-10-CM | POA: Diagnosis not present

## 2023-12-01 ENCOUNTER — Ambulatory Visit: Admitting: Internal Medicine

## 2023-12-15 DIAGNOSIS — G4733 Obstructive sleep apnea (adult) (pediatric): Secondary | ICD-10-CM | POA: Diagnosis not present

## 2023-12-29 ENCOUNTER — Other Ambulatory Visit (HOSPITAL_COMMUNITY): Payer: Self-pay

## 2023-12-29 MED ORDER — MOUNJARO 5 MG/0.5ML ~~LOC~~ SOAJ
5.0000 mg | SUBCUTANEOUS | 1 refills | Status: DC
  Filled 2023-12-29: qty 2, 28d supply, fill #0
  Filled 2024-03-31: qty 2, 28d supply, fill #1

## 2023-12-30 DIAGNOSIS — G4733 Obstructive sleep apnea (adult) (pediatric): Secondary | ICD-10-CM | POA: Diagnosis not present

## 2024-01-04 ENCOUNTER — Other Ambulatory Visit (HOSPITAL_COMMUNITY): Payer: Self-pay

## 2024-01-14 DIAGNOSIS — G4733 Obstructive sleep apnea (adult) (pediatric): Secondary | ICD-10-CM | POA: Diagnosis not present

## 2024-01-31 ENCOUNTER — Other Ambulatory Visit (HOSPITAL_COMMUNITY): Payer: Self-pay

## 2024-01-31 ENCOUNTER — Other Ambulatory Visit: Payer: Self-pay

## 2024-01-31 DIAGNOSIS — E049 Nontoxic goiter, unspecified: Secondary | ICD-10-CM | POA: Diagnosis not present

## 2024-01-31 DIAGNOSIS — F319 Bipolar disorder, unspecified: Secondary | ICD-10-CM | POA: Diagnosis not present

## 2024-01-31 DIAGNOSIS — M2550 Pain in unspecified joint: Secondary | ICD-10-CM | POA: Diagnosis not present

## 2024-01-31 DIAGNOSIS — G8929 Other chronic pain: Secondary | ICD-10-CM | POA: Diagnosis not present

## 2024-01-31 DIAGNOSIS — M79652 Pain in left thigh: Secondary | ICD-10-CM | POA: Diagnosis not present

## 2024-01-31 DIAGNOSIS — E785 Hyperlipidemia, unspecified: Secondary | ICD-10-CM | POA: Diagnosis not present

## 2024-01-31 DIAGNOSIS — M79651 Pain in right thigh: Secondary | ICD-10-CM | POA: Diagnosis not present

## 2024-01-31 DIAGNOSIS — F321 Major depressive disorder, single episode, moderate: Secondary | ICD-10-CM | POA: Diagnosis not present

## 2024-01-31 MED ORDER — ARIPIPRAZOLE 15 MG PO TABS
15.0000 mg | ORAL_TABLET | Freq: Every day | ORAL | 0 refills | Status: AC
  Filled 2024-01-31: qty 30, 30d supply, fill #0

## 2024-01-31 MED ORDER — BUPROPION HCL ER (XL) 150 MG PO TB24
150.0000 mg | ORAL_TABLET | Freq: Every morning | ORAL | 0 refills | Status: AC
  Filled 2024-01-31: qty 30, 30d supply, fill #0

## 2024-01-31 MED ORDER — DULOXETINE HCL 60 MG PO CPEP
60.0000 mg | ORAL_CAPSULE | Freq: Every day | ORAL | 0 refills | Status: AC
  Filled 2024-01-31: qty 30, 30d supply, fill #0

## 2024-01-31 MED ORDER — CYCLOBENZAPRINE HCL 5 MG PO TABS
5.0000 mg | ORAL_TABLET | Freq: Every evening | ORAL | 0 refills | Status: AC | PRN
  Filled 2024-01-31: qty 20, 10d supply, fill #0

## 2024-01-31 MED ORDER — ROSUVASTATIN CALCIUM 20 MG PO TABS
20.0000 mg | ORAL_TABLET | Freq: Every day | ORAL | 1 refills | Status: AC
  Filled 2024-01-31 – 2024-03-31 (×2): qty 90, 90d supply, fill #0

## 2024-02-03 ENCOUNTER — Other Ambulatory Visit: Payer: Self-pay

## 2024-02-03 ENCOUNTER — Ambulatory Visit: Attending: Internal Medicine | Admitting: Internal Medicine

## 2024-02-03 VITALS — BP 118/74 | HR 91 | Ht 63.0 in | Wt 146.8 lb

## 2024-02-03 DIAGNOSIS — R0789 Other chest pain: Secondary | ICD-10-CM | POA: Diagnosis not present

## 2024-02-03 DIAGNOSIS — I10 Essential (primary) hypertension: Secondary | ICD-10-CM | POA: Diagnosis not present

## 2024-02-03 DIAGNOSIS — E7800 Pure hypercholesterolemia, unspecified: Secondary | ICD-10-CM | POA: Diagnosis not present

## 2024-02-03 NOTE — Patient Instructions (Signed)
 Medication Instructions:  Your physician recommends that you continue on your current medications as directed. Please refer to the Current Medication list given to you today.  *If you need a refill on your cardiac medications before your next appointment, please call your pharmacy*  Lab Work: TODAY: ESR, CRP  Testing/Procedures: Echocardiogram  Your physician has requested that you have an echocardiogram. Echocardiography is a painless test that uses sound waves to create images of your heart. It provides your doctor with information about the size and shape of your heart and how well your heart's chambers and valves are working. This procedure takes approximately one hour. There are no restrictions for this procedure. Please do NOT wear cologne, perfume, aftershave, or lotions (deodorant is allowed). Please arrive 15 minutes prior to your appointment time.  Please note: We ask at that you not bring children with you during ultrasound (echo/ vascular) testing. Due to room size and safety concerns, children are not allowed in the ultrasound rooms during exams. Our front office staff cannot provide observation of children in our lobby area while testing is being conducted. An adult accompanying a patient to their appointment will only be allowed in the ultrasound room at the discretion of the ultrasound technician under special circumstances. We apologize for any inconvenience.  Follow-Up: At [facility], you and your health needs are our priority.  As part of our continuing mission to provide you with exceptional heart care, our providers are all part of one team.  This team includes your primary Cardiologist (physician) and Advanced Practice Providers or APPs (Physician Assistants and Nurse Practitioners) who all work together to provide you with the care you need, when you need it.  Your next appointment:   As needed based on results of testing

## 2024-02-03 NOTE — Progress Notes (Signed)
 Cardiology Office Note:  .    Date:  02/03/2024  ID:  [name], DOB [birth year], MRN [id number] PCP: Cristopher Bottcher, NP  [facility] Providers Cardiologist:  None     CC: CP f/u  History of Present Illness: .    [name] is a 58 y.o. female  with hypertension, hyperlipidemia, and type 2 diabetes who presents with chest pain follow up.  [name] is a 58 year old female with hypertension and hyperlipidemia who presents with atypical chest pain. She was referred by her nurse practitioner for evaluation of chest pain despite negative CCTA.  She experiences atypical chest pain described as a 'squeezing' sensation starting in the front of her chest on the left side and radiating to the upper back. The pain occurs periodically and can come 'out of the blue'.  She also experiences shortness of breath, especially when walking, which leaves her feeling tired and drained. No palpitations or irregular heartbeats are reported.  She reports that her cardiac workup, including a CT scan, did not show any blockages, and she was told her arteries appeared normal. Despite this, she continues to experience symptoms. She has a history of elevated blood sugars and cholesterol, which prompted the initial cardiac evaluation.  She finds it difficult to exercise as she feels like she might 'pass out' during physical activity. She has attempted walking in her yard but struggles with feeling exhausted.  Discussed the use of AI scribe software for clinical note transcription with the patient, who gave verbal consent to proceed.  Relevant histories: .  Social Father had congestive heart failure, unclear etiology ROS: As per HPI.   Studies Reviewed: .    Cardiac Studies & Procedures   ______________________________________________________________________________________________          CT SCANS  CT CORONARY MORPH W/CTA COR W/SCORE 08/11/2023  Addendum  08/19/2023 12:58 AM ADDENDUM REPORT: 08/19/2023 00:55  EXAM: OVER-READ INTERPRETATION  CT CHEST  The following report is an over-read performed by radiologist Dr. Oneil Devonshire of [facility] on 08/19/2023. This over-read does not include interpretation of cardiac or coronary anatomy or pathology. The coronary calcium  score/coronary CTA interpretation by the cardiologist is attached.  COMPARISON:  None.  FINDINGS: Cardiovascular: There are no significant extracardiac vascular findings.  Mediastinum/Nodes: There are no enlarged lymph nodes within the visualized mediastinum.  Lungs/Pleura: There is no pleural effusion. The visualized lungs appear clear.  Upper abdomen: No significant findings in the visualized upper abdomen.  Musculoskeletal/Chest wall: No chest wall mass or suspicious osseous findings within the visualized chest.  IMPRESSION: No significant extracardiac findings within the visualized chest.   Electronically Signed By: Oneil Devonshire M.D. On: 08/19/2023 00:55  Narrative CLINICAL DATA:  58 Year-old Female  EXAM: Cardiac/Coronary  CTA  TECHNIQUE: The patient was scanned on a [employer].  FINDINGS: Axial non-contrast 3 mm slices were carried out through the heart. The data set was analyzed on a dedicated work station and scored using the Agatson method. Gantry rotation speed was 250 msecs and collimation was .6 mm. 0.8 mg of sl NTG was given. The 3D data set was reconstructed in 5% intervals of the 35-75% of the R-R cycle. The patient received 100 cc of contrast.  Coronary Arteries:  Normal coronary origin.  Right dominance.  Coronary Calcium  Score: 0  Percentile: 1st for age, sex, and race matched control.  Plaque Analysis: None  Left main: The left main is a large caliber vessel with  a normal take off from the left coronary cusp that bifurcates to form a left anterior descending artery and a left circumflex artery.  There is no significant plaque.  Left anterior descending artery: The LAD is a large caliber vessel with multiple diagonal vessels. There is no significant plaque.  Left circumflex artery: The LCX is non-dominant that gives off two OM vessels. There is no significant plaque.  Right coronary artery: The RCA is dominant with normal take off from the right coronary cusp. the RCA terminates as a PDA and right posterolateral branch. There is no significant plaque.  Other non-coronary findings:  Right Atrium: Right atrial size is within normal limits.  Right Ventricle: The right ventricular cavity is within normal limits.  Left Atrium: Left atrial size is normal in size with no left atrial appendage filling defect.  Left Ventricle: The ventricular cavity size is within normal limits.  Interatrial septum: No PFO or ASD.  Main Pulmonary Artery: Normal size of the pulmonary artery.  Pulmonary veins: Normal pulmonary venous drainage with short left common trunk.  Pericardium: Normal thickness without significant effusion or calcium  present.  Cardiac valves: The aortic valve is trileaflet without significant calcification. The mitral valve is normal without significant calcification.  Aorta: Normal caliber without significant calcifications.  Extra-cardiac findings: See attached radiology report for non-cardiac structures.  Artifact: Excellent  Image quality: NA  IMPRESSION: 1. Coronary calcium  score of 0. This was 1st percentile for age, sex, and race matched control.  2. Normal coronary origin with right dominance.  3. CAD-RADS 0. No evidence of CAD (0%). Consider non-atherosclerotic causes of chest pain.  RECOMMENDATIONS: RECOMMENDATIONS The proposed cut-off value of 1,651 AU yielded a 93 % sensitivity and 75 % specificity in grading AS severity in patients with classical low-flow, low-gradient AS. Proposed different cut-off values to define severe AS for men and  women as 2,065 AU and 1,274 AU, respectively. The joint European and American recommendations for the assessment of AS consider the aortic valve calcium  score as a continuum - a very high calcium  score suggests severe AS and a low calcium  score suggests severe AS is unlikely.  Donney VEAR Jarome LULLA Stephen RENETTE, et al. 2017 ESC/EACTS Guidelines for the management of valvular heart disease. Eur Heart J 2017;38:2739-91.  Coronary artery calcium  (CAC) score is a strong predictor of incident coronary heart disease (CHD) and provides predictive information beyond traditional risk factors. CAC scoring is reasonable to use in the decision to withhold, postpone, or initiate statin therapy in intermediate-risk or selected borderline-risk asymptomatic adults (age 44-75 years and LDL-C >=70 to <190 mg/dL) who do not have diabetes or established atherosclerotic cardiovascular disease (ASCVD).* In intermediate-risk (10-year ASCVD risk >=7.5% to <20%) adults or selected borderline-risk (10-year ASCVD risk >=5% to <7.5%) adults in whom a CAC score is measured for the purpose of making a treatment decision the following recommendations have been made:  If CAC = 0, it is reasonable to withhold statin therapy and reassess in 5 to 10 years, as long as higher risk conditions are absent (diabetes mellitus, family history of premature CHD in first degree relatives (males <55 years; females <65 years), cigarette smoking, LDL >=190 mg/dL or other independent risk factors).  If CAC is 1 to 99, it is reasonable to initiate statin therapy for patients >=35 years of age.  If CAC is >=100 or >=75th percentile, it is reasonable to initiate statin therapy at any age.  Cardiology referral should be considered for patients with CAC scores =  400 or >=75th percentile.  *2018 AHA/ACC/AACVPR/AAPA/ABC/ACPM/ADA/AGS/APhA/ASPC/NLA/PCNA Guideline on the Management of Blood Cholesterol: A Report of the American College of  Cardiology/American Heart Association Task Force on Clinical Practice Guidelines. J Am Coll Cardiol. 2019;73(24):3168-3209.  Electronically Signed: By: Stanly Leavens M.D. On: 08/11/2023 17:16     ______________________________________________________________________________________________        Physical Exam:    VS:  BP 118/74   Pulse 91   Ht 5' 3 (1.6 m)   Wt 146 lb 12.8 oz (66.6 kg)   SpO2 91%   BMI 26.00 kg/m    Wt Readings from Last 3 Encounters:  02/03/24 146 lb 12.8 oz (66.6 kg)  07/27/23 162 lb (73.5 kg)  08/21/22 157 lb 3 oz (71.3 kg)    Gen: no distress   Neck: No JVD Cardiac: No Rubs or Gallops, no murmur, RRR +2 radial pulses Respiratory: Clear to auscultation bilaterally, normal effort, normal  respiratory rate GI: Soft, nontender, non-distended  MS: Noedema;  moves all extremities Integument: Skin feels warm Neuro:  At time of evaluation, alert and oriented to person/place/time/situation  Psych: Normal affect, patient feels fair   ASSESSMENT AND PLAN: .    Atypical chest pain Intermittent chest pain described as squeezing, starting in the left chest and radiating to the upper back, accompanied by shortness of breath and fatigue. Previous cardiac workup, including CT scan, was negative for coronary artery disease. Differential diagnosis includes non-cardiac causes such as inflammation or pericarditis, though pericarditis is less likely due to lack of pleuritic pain and recent infection history. Suspected non-cardiac etiology, but further evaluation is warranted to rule out cardiac causes. - Ordered echocardiogram to assess cardiac function and rule out cardiac causes of chest pain. - Ordered blood tests including ESR and CRP to evaluate for inflammatory processes. - If echocardiogram or blood tests reveal cardiac abnormalities, will initiate appropriate treatment. - If cardiac evaluation is normal, can f/u with PCNP  Hypertension - well  controlled  Hyperlipidemia with T2DM Morbid Obesity - Working on her LDL levels, goal < 100, if elevated at next dose increase statin - continue to lose weight, LDL will likely continue to improve  Obstructive sleep apnea - notes it has improved with GLP-1 RA  PRN f/u unless cardiac disease is found  Stanly Leavens, MD FASE [facility] Cardiologist [facility]  [address] [place], [place] [address] [phone]  10:56 AM

## 2024-02-04 ENCOUNTER — Ambulatory Visit: Payer: Self-pay | Admitting: *Deleted

## 2024-02-04 LAB — C-REACTIVE PROTEIN: CRP: <1 mg/L (ref 0–10)

## 2024-02-04 LAB — SEDIMENTATION RATE: Sed Rate: 16 mm/h (ref 0–40)

## 2024-02-10 ENCOUNTER — Other Ambulatory Visit (HOSPITAL_COMMUNITY): Payer: Self-pay

## 2024-03-09 ENCOUNTER — Ambulatory Visit (HOSPITAL_COMMUNITY)
Admission: RE | Admit: 2024-03-09 | Discharge: 2024-03-09 | Disposition: A | Discharge status: Home / Self Care | Source: Ambulatory Visit | Attending: Internal Medicine | Admitting: Internal Medicine

## 2024-03-09 DIAGNOSIS — R079 Chest pain, unspecified: Secondary | ICD-10-CM | POA: Diagnosis not present

## 2024-03-09 DIAGNOSIS — R0789 Other chest pain: Secondary | ICD-10-CM | POA: Insufficient documentation

## 2024-03-09 DIAGNOSIS — E7800 Pure hypercholesterolemia, unspecified: Secondary | ICD-10-CM | POA: Diagnosis not present

## 2024-03-09 DIAGNOSIS — I10 Essential (primary) hypertension: Secondary | ICD-10-CM | POA: Insufficient documentation

## 2024-03-09 LAB — ECHOCARDIOGRAM COMPLETE
Area-P 1/2: 3.56 cm2
S' Lateral: 2.5 cm

## 2024-03-31 ENCOUNTER — Other Ambulatory Visit (HOSPITAL_COMMUNITY): Payer: Self-pay

## 2024-03-31 ENCOUNTER — Other Ambulatory Visit: Payer: Self-pay

## 2024-04-01 ENCOUNTER — Other Ambulatory Visit (HOSPITAL_COMMUNITY): Payer: Self-pay

## 2024-04-12 ENCOUNTER — Other Ambulatory Visit (HOSPITAL_COMMUNITY): Payer: Self-pay

## 2024-04-12 MED ORDER — NOVOLOG FLEXPEN 100 UNIT/ML ~~LOC~~ SOPN
3.0000 [IU] | PEN_INJECTOR | Freq: Three times a day (TID) | SUBCUTANEOUS | 3 refills | Status: AC
  Filled 2024-04-12: qty 6, 50d supply, fill #0

## 2024-04-12 MED ORDER — METFORMIN HCL ER 500 MG PO TB24
1000.0000 mg | ORAL_TABLET | Freq: Every day | ORAL | 5 refills | Status: AC
  Filled 2024-04-12: qty 60, 30d supply, fill #0

## 2024-04-12 MED ORDER — TRESIBA FLEXTOUCH 100 UNIT/ML ~~LOC~~ SOPN
14.0000 [IU] | PEN_INJECTOR | Freq: Every day | SUBCUTANEOUS | 3 refills | Status: AC
  Filled 2024-04-12: qty 6, 42d supply, fill #0

## 2024-05-03 ENCOUNTER — Other Ambulatory Visit (HOSPITAL_COMMUNITY): Payer: Self-pay

## 2024-05-03 MED ORDER — MOUNJARO 5 MG/0.5ML ~~LOC~~ SOAJ
5.0000 mg | SUBCUTANEOUS | 3 refills | Status: AC
  Filled 2024-05-03: qty 2, 28d supply, fill #0

## 2024-05-04 ENCOUNTER — Other Ambulatory Visit: Payer: Self-pay
